# Patient Record
Sex: Male | Born: 1955 | Race: White | Hispanic: No | Marital: Married | Smoking: Never smoker
Health system: Southern US, Community
[De-identification: ages and names within clinical notes are randomized; demographics above are authoritative.]

## PROBLEM LIST (undated history)

## (undated) DIAGNOSIS — H8110 Benign paroxysmal vertigo, unspecified ear: Secondary | ICD-10-CM

## (undated) DIAGNOSIS — M1990 Unspecified osteoarthritis, unspecified site: Secondary | ICD-10-CM

## (undated) DIAGNOSIS — K219 Gastro-esophageal reflux disease without esophagitis: Secondary | ICD-10-CM

## (undated) DIAGNOSIS — N529 Male erectile dysfunction, unspecified: Secondary | ICD-10-CM

## (undated) HISTORY — DX: Male erectile dysfunction, unspecified: N52.9

## (undated) HISTORY — DX: Benign paroxysmal vertigo, unspecified ear: H81.10

## (undated) HISTORY — DX: Gastro-esophageal reflux disease without esophagitis: K21.9

## (undated) HISTORY — DX: Unspecified osteoarthritis, unspecified site: M19.90

---

## 2002-07-09 ENCOUNTER — Emergency Department (HOSPITAL_COMMUNITY): Admission: EM | Admit: 2002-07-09 | Discharge: 2002-07-09 | Payer: Self-pay | Admitting: Emergency Medicine

## 2002-07-14 ENCOUNTER — Emergency Department (HOSPITAL_COMMUNITY): Admission: EM | Admit: 2002-07-14 | Discharge: 2002-07-14 | Payer: Self-pay | Admitting: Emergency Medicine

## 2003-04-01 HISTORY — PX: ROTATOR CUFF REPAIR: SHX139

## 2005-01-29 ENCOUNTER — Emergency Department (HOSPITAL_COMMUNITY): Admission: EM | Admit: 2005-01-29 | Discharge: 2005-01-29 | Payer: Self-pay | Admitting: Emergency Medicine

## 2005-02-01 ENCOUNTER — Ambulatory Visit: Payer: Self-pay | Admitting: Specialist

## 2006-03-31 HISTORY — PX: ANKLE SURGERY: SHX546

## 2010-02-27 ENCOUNTER — Encounter (INDEPENDENT_AMBULATORY_CARE_PROVIDER_SITE_OTHER): Payer: Self-pay | Admitting: *Deleted

## 2010-02-27 ENCOUNTER — Ambulatory Visit: Payer: Self-pay | Admitting: Internal Medicine

## 2010-02-27 DIAGNOSIS — R5381 Other malaise: Secondary | ICD-10-CM

## 2010-02-27 DIAGNOSIS — M159 Polyosteoarthritis, unspecified: Secondary | ICD-10-CM | POA: Insufficient documentation

## 2010-02-27 DIAGNOSIS — R5383 Other fatigue: Secondary | ICD-10-CM

## 2010-02-27 DIAGNOSIS — K219 Gastro-esophageal reflux disease without esophagitis: Secondary | ICD-10-CM

## 2010-03-01 LAB — CONVERTED CEMR LAB
ALT: 38 units/L (ref 0–53)
AST: 27 units/L (ref 0–37)
Albumin: 4.2 g/dL (ref 3.5–5.2)
Alkaline Phosphatase: 58 units/L (ref 39–117)
BUN: 13 mg/dL (ref 6–23)
Basophils Absolute: 0.1 10*3/uL (ref 0.0–0.1)
Basophils Relative: 1.1 % (ref 0.0–3.0)
Bilirubin, Direct: 0.1 mg/dL (ref 0.0–0.3)
CO2: 28 meq/L (ref 19–32)
Calcium: 9.3 mg/dL (ref 8.4–10.5)
Chloride: 103 meq/L (ref 96–112)
Cholesterol: 221 mg/dL — ABNORMAL HIGH (ref 0–200)
Creatinine, Ser: 0.9 mg/dL (ref 0.4–1.5)
Direct LDL: 155.1 mg/dL
Eosinophils Absolute: 0.2 10*3/uL (ref 0.0–0.7)
Eosinophils Relative: 3.2 % (ref 0.0–5.0)
GFR calc non Af Amer: 91.73 mL/min (ref >60)
Glucose, Bld: 88 mg/dL (ref 70–99)
HCT: 41 % (ref 39.0–52.0)
HDL: 39.2 mg/dL (ref >39.00)
Hemoglobin: 14.5 g/dL (ref 13.0–17.0)
Lymphocytes Relative: 22.2 % (ref 12.0–46.0)
Lymphs Abs: 1.2 10*3/uL (ref 0.7–4.0)
MCHC: 35.3 g/dL (ref 30.0–36.0)
MCV: 90.3 fL (ref 78.0–100.0)
Monocytes Absolute: 0.5 10*3/uL (ref 0.1–1.0)
Monocytes Relative: 9.5 % (ref 3.0–12.0)
Neutro Abs: 3.6 10*3/uL (ref 1.4–7.7)
Neutrophils Relative %: 64 % (ref 43.0–77.0)
PSA: 2.33 ng/mL (ref 0.10–4.00)
Phosphorus: 3.7 mg/dL (ref 2.3–4.6)
Platelets: 224 10*3/uL (ref 150.0–400.0)
Potassium: 4.1 meq/L (ref 3.5–5.1)
RBC: 4.54 M/uL (ref 4.22–5.81)
RDW: 13.4 % (ref 11.5–14.6)
Sodium: 140 meq/L (ref 135–145)
TSH: 1.41 microintl units/mL (ref 0.35–5.50)
Total Bilirubin: 0.6 mg/dL (ref 0.3–1.2)
Total CHOL/HDL Ratio: 6
Total Protein: 7.2 g/dL (ref 6.0–8.3)
Triglycerides: 158 mg/dL — ABNORMAL HIGH (ref 0.0–149.0)
VLDL: 31.6 mg/dL (ref 0.0–40.0)
WBC: 5.6 10*3/uL (ref 4.5–10.5)

## 2010-03-29 ENCOUNTER — Encounter (INDEPENDENT_AMBULATORY_CARE_PROVIDER_SITE_OTHER): Payer: Self-pay | Admitting: *Deleted

## 2010-04-03 ENCOUNTER — Ambulatory Visit
Admission: RE | Admit: 2010-04-03 | Discharge: 2010-04-03 | Payer: Self-pay | Source: Home / Self Care | Attending: Internal Medicine | Admitting: Internal Medicine

## 2010-04-17 ENCOUNTER — Ambulatory Visit
Admission: RE | Admit: 2010-04-17 | Discharge: 2010-04-17 | Payer: Self-pay | Source: Home / Self Care | Attending: Internal Medicine | Admitting: Internal Medicine

## 2010-04-17 ENCOUNTER — Encounter: Payer: Self-pay | Admitting: Internal Medicine

## 2010-04-17 LAB — HM COLONOSCOPY

## 2010-04-30 NOTE — Assessment & Plan Note (Signed)
Summary: NEW PT TO BE ESTABLISHED/JRR   Vital Signs:  Patient profile:   55 year old male Height:      67 inches Weight:      212 pounds BMI:     33.32 Temp:     98.6 degrees F oral Pulse rate:   90 / minute Pulse rhythm:   regular BP sitting:   130 / 80  (left arm) Cuff size:   large  Vitals Entered By: Mervin Hack CMA Duncan Dull) (February 27, 2010 11:55 AM) CC: NEW PATIENT TO ESTABLISH CARE   History of Present Illness: Here to establish for medical care Had seen Dr Juanetta Gosling and Chrismon in past  Has seen  ENT for serious dizzy spells.  Never really used the meds for this has had several recurrences  Has had arthrtis since 08-28-2002 Left knee effusion needed drainage No clear diagnosis but not RA Sounds like he did have prednisone wean over a few years and no sig recurrences Now has some wrist pain at times  Uses OTC pill for acid reflux Occ gets feeling of "knot or muscle tightening up" in left chest---then releases  has some fatigue Notes that he will just nap occ --esp after meals not overly tired on a regular basis  Preventive Screening-Counseling & Management  Alcohol-Tobacco     Smoking Status: never  Allergies (verified): 1)  ! Sudafed (Pseudoephedrine Hcl)  Past History:  Past Medical History: Benign positional vertigo Osteoarthritis GERD  Past Surgical History: Rotator cuff right shoulder   2003/08/28 Fracture left ankle (2 pins)  2006/08/28  Family History: Dad died of pancreatic cancer @45  Mom is healthy 1 sister with DM DM in paternal GM also No HTN or CAD No  prostate cancer Pat GM may have had colon cancer in 29's  Social History: Occupation:  IT trainer  at [employer] Married-- 3 children Never Smoked Alcohol use-rare beer/wine Occupation:  employed Smoking Status:  never  Review of Systems General:  Physically active at work Ran until he broke ankle---hasn't gotten back into regular routine weight up a little  again sleeps fairly well--occ pain in right hand affects it wears seat belt. Eyes:  Denies double vision and vision loss-1 eye; reading glasses now. ENT:  Complains of decreased hearing and ringing in ears; tinnitus with the vertigo Has been getting recent work with the dentist. CV:  Complains of shortness of breath with exertion; denies chest pain or discomfort, difficulty breathing at night, difficulty breathing while lying down, fatigue, and palpitations; had palpitations till he went to decaf tea Has noted some changes in being able to run up stairs. Resp:  Denies cough; gets spasm in left chest---??hiatal hernia. GI:  Complains of indigestion; denies abdominal pain, bloody stools, change in bowel habits, dark tarry stools, nausea, and vomiting. GU:  Denies erectile dysfunction, urinary frequency, and urinary hesitancy; only occ nocturia. MS:  Complains of joint pain; denies joint swelling; now just mostly right wrist. Derm:  Denies lesion(s) and rash. Neuro:  Complains of tingling; denies headaches and weakness; occ tingling in hands. Psych:  Denies anxiety and depression. Heme:  Denies abnormal bruising and enlarge lymph nodes. Allergy:  Denies seasonal allergies and sneezing; no recent allergy problems---had fall problems in past.  Physical Exam  General:  alert and normal appearance.   Eyes:  pupils equal, pupils round, pupils reactive to light, and no optic disk abnormalities.   Ears:  R ear normal and L ear normal.   Mouth:  no erythema,  no exudates, and no lesions.   Neck:  supple, no masses, no thyromegaly, no carotid bruits, and no cervical lymphadenopathy.   Lungs:  normal respiratory effort, no intercostal retractions, no accessory muscle use, and normal breath sounds.   Heart:  normal rate, regular rhythm, no murmur, and no gallop.   Abdomen:  soft, non-tender, and no masses.   Rectal:  no hemorrhoids and no masses.   Prostate:  no gland enlargement and no nodules.    Msk:  no joint tenderness and no joint swelling.   Pulses:  1+ in feet Extremities:  no edema Neurologic:  alert & oriented X3, strength normal in all extremities, and gait normal.   Skin:  no rashes and no suspicious lesions.   Axillary Nodes:  No palpable lymphadenopathy Psych:  normally interactive, good eye contact, not anxious appearing, and not depressed appearing.     Impression & Recommendations:  Problem # 1:  PREVENTIVE HEALTH CARE (ICD-V70.0) Assessment Comment Only  healthy discussed fitness due for PSA and colonoscopy  Orders: TLB-Lipid Panel (80061-LIPID)  Problem # 2:  GERD (ICD-530.81) Assessment: Comment Only uses OTC med (?omeprazole) not sure if brief chest symptoms are related  Problem # 3:  OSTEOARTHRITIS (ICD-715.90) Assessment: Comment Only mild in wrist may have had inflammatory arthritis in past but quiet now  Other Orders: TLB-Renal Function Panel (80069-RENAL) TLB-CBC Platelet - w/Differential (85025-CBCD) TLB-Hepatic/Liver Function Pnl (80076-HEPATIC) TLB-TSH (Thyroid Stimulating Hormone) (84443-TSH) Venipuncture ([address]) TLB-PSA (Prostate Specific Antigen) (84153-PSA) Gastroenterology Referral (GI)  Patient Instructions: 1)  Please schedule a follow-up appointment in 1 year.  2)  Schedule a colonoscopy/ sigmoidoscopy to help detect colon cancer.    Orders Added: 1)  New Patient 40-64 years [[address]] 2)  TLB-Renal Function Panel [80069-RENAL] 3)  TLB-CBC Platelet - w/Differential [85025-CBCD] 4)  TLB-Hepatic/Liver Function Pnl [80076-HEPATIC] 5)  TLB-TSH (Thyroid Stimulating Hormone) [84443-TSH] 6)  Venipuncture [60454] 7)  TLB-Lipid Panel [80061-LIPID] 8)  TLB-PSA (Prostate Specific Antigen) [09811-BJY] 9)  Gastroenterology Referral [GI]   Immunization History:  Tetanus/Td Immunization History:    Tetanus/Td:  Historical (03/31/2008)   Immunization History:  Tetanus/Td Immunization History:    Tetanus/Td:  Historical  (03/31/2008)  Prior Medications: None Current Allergies (reviewed today): ! SUDAFED (PSEUDOEPHEDRINE HCL)

## 2010-04-30 NOTE — Letter (Signed)
Summary: Pre Visit Letter Revised  [facility]  [address] [place], [place] [address]   Phone: [phone]  Fax: [phone]        02/27/2010 MRN: [id number] Jose Edwards [address] [place], [place]  [address]             Procedure Date:  04-17-10   Welcome to the Gastroenterology Division at [facility].    You are scheduled to see a nurse for your pre-procedure visit on 04-03-10 at 10:30A.M. on the 3rd floor at [facility], [address]  We ask that you try to arrive at our office 15 minutes prior to your appointment time to allow for check-in.  Please take a minute to review the attached form.  If you answer "Yes" to one or more of the questions on the first page, we ask that you call the person listed at your earliest opportunity.  If you answer "No" to all of the questions, please complete the rest of the form and bring it to your appointment.    Your nurse visit will consist of discussing your medical and surgical history, your immediate family medical history, and your medications.   If you are unable to list all of your medications on the form, please bring the medication bottles to your appointment and we will list them.  We will need to be aware of both prescribed and over the counter drugs.  We will need to know exact dosage information as well.    Please be prepared to read and sign documents such as consent forms, a financial agreement, and acknowledgement forms.  If necessary, and with your consent, a friend or relative is welcome to sit-in on the nurse visit with you.  Please bring your insurance card so that we may make a copy of it.  If your insurance requires a referral to see a specialist, please bring your referral form from your primary care physician.  No co-pay is required for this nurse visit.     If you cannot keep your appointment, please call [phone] to cancel or reschedule prior to your appointment date.  This allows [place]  the opportunity to schedule an appointment for another patient in need of care.    Thank you for choosing [facility] for your medical needs.  We appreciate the opportunity to care for you.  Please visit [place] at our website  to learn more about our practice.  Sincerely, The Gastroenterology Division

## 2010-05-02 NOTE — Procedures (Signed)
Summary: Colonoscopy  Patient: Jose Edwards Note: All result statuses are Final unless otherwise noted.  Tests: (1) Colonoscopy (COL)   COL Colonoscopy           DONE     [facility]     [address]     [place], [place]  [address]           COLONOSCOPY PROCEDURE REPORT           PATIENT:  Jose Edwards, Jose Edwards  MR#:  [id number]     BIRTHDATE:  December 16, 1955, 54 yrs. old  GENDER:  male     ENDOSCOPIST:  Iva Boop, MD, [facility]     REF. BY:  Tillman Abide, M.D.     PROCEDURE DATE:  04/17/2010     PROCEDURE:  Colonoscopy 19147     ASA CLASS:  Class I     INDICATIONS:  Routine Risk Screening     MEDICATIONS:   Fentanyl 50 mcg IV, Versed 5 mg IV           DESCRIPTION OF PROCEDURE:   After the risks benefits and     alternatives of the procedure were thoroughly explained, informed     consent was obtained.  Digital rectal exam was performed and     revealed no abnormalities and normal prostate.   The LB CF-[id number]     [id number] endoscope was introduced through the anus and advanced to     the cecum, which was identified by both the appendix and ileocecal     valve, without limitations.  The quality of the prep was     excellent, using MoviPrep.  The instrument was then slowly     withdrawn as the colon was fully examined. Insertion: 1:18 minutes     Withdrawal: 11:36 minutes     <<PROCEDUREIMAGES>>           FINDINGS:  A normal appearing cecum, ileocecal valve, and     appendiceal orifice were identified. The ascending, hepatic     flexure, transverse, splenic flexure, descending, sigmoid colon,     and rectum appeared unremarkable.   Retroflexed views in the right     colon and  rectum revealed no abnormalities.    The scope was then     withdrawn from the patient and the procedure completed.           COMPLICATIONS:  None     ENDOSCOPIC IMPRESSION:     1) Normal colonoscopy with excellent prep           REPEAT EXAM:  In 10 year(s) for routine screening colonoscopy.          Iva Boop, MD, [facility]           CC:  Tillman Abide, M.D.     The Patient           n.     eSIGNED:   Iva Boop at 04/17/2010 10:32 AM           Mamie Laurel, [phone]  Note: An exclamation mark (!) indicates a result that was not dispersed into the flowsheet. Document Creation Date: 04/17/2010 10:32 AM _______________________________________________________________________  (1) Order result status: Final Collection or observation date-time: 04/17/2010 10:26 Requested date-time:  Receipt date-time:  Reported date-time:  Referring Physician:   Ordering Physician: Stan Head [phone]) Specimen Source:  Source: Launa Grill Order Number: [id number] Lab site:   Appended Document: Colonoscopy    Clinical Lists Changes  Observations: Added new observation of COLONNXTDUE: 03/2020 (04/17/2010 13:24)

## 2010-05-02 NOTE — Miscellaneous (Signed)
Summary: [facility]  Clinical Lists Changes  Medications: Added new medication of MOVIPREP 100 GM  SOLR (PEG-KCL-NACL-NASULF-NA ASC-C) As per prep instructions. - Signed Rx of MOVIPREP 100 GM  SOLR (PEG-KCL-NACL-NASULF-NA ASC-C) As per prep instructions.;  #1 x 0;  Signed;  Entered by: Ezra Sites RN;  Authorized by: Iva Boop MD, [facility];  Method used: Electronically to [facility] [address], [place], [place], [place]  [address], Ph: [phone], Fax: [phone] Observations: Added new observation of ALLERGY REV: Done (04/03/2010 10:15)    Prescriptions: MOVIPREP 100 GM  SOLR (PEG-KCL-NACL-NASULF-NA ASC-C) As per prep instructions.  #1 x 0   Entered by:   Ezra Sites RN   Authorized by:   Iva Boop MD, [facility]   Signed by:   Ezra Sites RN on 04/03/2010   Method used:   Electronically to        [facility] (retail)       [address]       [place], [place]  [address]       Ph: [phone]       Fax: [phone]   RxID:   [id number]

## 2010-05-02 NOTE — Letter (Signed)
Summary: [facility] Instructions  [facility]  [address] [place], [place] [address]   Phone: [phone]  Fax: [phone]       Jose Edwards    07-24-1955    MRN: [id number]        Procedure Day Dorna Bloom: Wednesday 04/17/2010     Arrival Time: 9:00AM     Procedure Time: 10:00AM     Location of Procedure:                    _X_  [facility] (4th Floor)                       PREPARATION FOR COLONOSCOPY WITH MOVIPREP   Starting 5 days prior to your procedure 04/12/2010 do not eat nuts, seeds, popcorn, corn, beans, peas,  salads, or any raw vegetables.  Do not take any fiber supplements (e.g. Metamucil, Citrucel, and Benefiber).  THE DAY BEFORE YOUR PROCEDURE         DATE: 1/17     DAY: Tuesday  1.  Drink clear liquids the entire day-NO SOLID FOOD  2.  Do not drink anything colored red or purple.  Avoid juices with pulp.  No orange juice.  3.  Drink at least 64 oz. (8 glasses) of fluid/clear liquids during the day to prevent dehydration and help the prep work efficiently.  CLEAR LIQUIDS INCLUDE: Water Jello Ice Popsicles Tea (sugar ok, no milk/cream) Powdered fruit flavored drinks Coffee (sugar ok, no milk/cream) Gatorade Juice: apple, white grape, white cranberry  Lemonade Clear bullion, consomm, broth Carbonated beverages (any kind) Strained chicken noodle soup Hard Candy                             4.  In the morning, mix first dose of MoviPrep solution:    Empty 1 Pouch A and 1 Pouch B into the disposable container    Add lukewarm drinking water to the top line of the container. Mix to dissolve    Refrigerate (mixed solution should be used within 24 hrs)  5.  Begin drinking the prep at 5:00 p.m. The MoviPrep container is divided by 4 marks.   Every 15 minutes drink the solution down to the next mark (approximately 8 oz) until the full liter is complete.   6.  Follow completed prep with 16 oz of clear liquid of your choice (Nothing  red or purple).  Continue to drink clear liquids until bedtime.  7.  Before going to bed, mix second dose of MoviPrep solution:    Empty 1 Pouch A and 1 Pouch B into the disposable container    Add lukewarm drinking water to the top line of the container. Mix to dissolve    Refrigerate  THE DAY OF YOUR PROCEDURE      DATE: 1/18     DAY: Wednesday  Beginning at 5:00AM (5 hours before procedure):         1. Every 15 minutes, drink the solution down to the next mark (approx 8 oz) until the full liter is complete.  2. Follow completed prep with 16 oz. of clear liquid of your choice.    3. You may drink clear liquids until 8:00AM (2 HOURS BEFORE PROCEDURE).   MEDICATION INSTRUCTIONS  Unless otherwise instructed, you should take regular prescription medications with a small sip of water   as early as possible the morning  of your procedure.        OTHER INSTRUCTIONS  You will need a responsible adult at least 56 years of age to accompany you and drive you home.   This person must remain in the waiting room during your procedure.  Wear loose fitting clothing that is easily removed.  Leave jewelry and other valuables at home.  However, you may wish to bring a book to read or  an iPod/MP3 player to listen to music as you wait for your procedure to start.  Remove all body piercing jewelry and leave at home.  Total time from sign-in until discharge is approximately 2-3 hours.  You should go home directly after your procedure and rest.  You can resume normal activities the  day after your procedure.  The day of your procedure you should not:   Drive   Make legal decisions   Operate machinery   Drink alcohol   Return to work  You will receive specific instructions about eating, activities and medications before you leave.    The above instructions have been reviewed and explained to me by   Ezra Sites RN  April 03, 2010 10:36 AM    I fully understand and can  verbalize these instructions _____________________________ Date _________

## 2011-10-06 ENCOUNTER — Emergency Department: Payer: Self-pay | Admitting: Unknown Physician Specialty

## 2011-11-17 ENCOUNTER — Ambulatory Visit (INDEPENDENT_AMBULATORY_CARE_PROVIDER_SITE_OTHER): Payer: BC Managed Care – PPO | Admitting: Internal Medicine

## 2011-11-17 ENCOUNTER — Encounter: Payer: Self-pay | Admitting: Internal Medicine

## 2011-11-17 ENCOUNTER — Telehealth: Payer: Self-pay | Admitting: Internal Medicine

## 2011-11-17 VITALS — BP 128/78 | HR 94 | Temp 97.9°F | Ht 67.0 in | Wt 213.0 lb

## 2011-11-17 DIAGNOSIS — R51 Headache: Secondary | ICD-10-CM

## 2011-11-17 NOTE — Progress Notes (Signed)
  Subjective:    Patient ID: Jose Edwards, male    DOB: Jun 08, 1955, 56 y.o.   MRN: [id number]  HPI Here with wife Has spot above right ear--sore to touch since 3-4 days ago Also pain in another spot towards occiput Pain just lying on it in bed--has had to lie on left side No known injury  Now the one in back of head feels worse No inside the head headache No rash or visible lesions  Took some ibuprofen--400mg  this AM may have helped some Pain even with no pressure---but worse with touching  No current outpatient prescriptions on file prior to visit.    Allergies  Allergen Reactions  . Pseudoephedrine     REACTION: makes drowsy    Past Medical History  Diagnosis Date  . GERD (gastroesophageal reflux disease)   . Osteoarthrosis, unspecified whether generalized or localized, unspecified site   . Benign positional vertigo     Past Surgical History  Procedure Date  . Rotator cuff repair 2005    right shoulder  . Ankle surgery 2008    fracture left ankle ( 2 pins)    Family History  Problem Relation Age of Onset  . Healthy Mother   . Diabetes Sister   . Diabetes Paternal Grandmother   . Coronary artery disease Neg Hx   . Hypertension Neg Hx   . Cancer Neg Hx     prostate    History   Social History  . Marital Status: Married    Spouse Name: N/A    Number of Children: 3  . Years of Education: N/A   Occupational History  . [employer]   Social History Main Topics  . Smoking status: Never Smoker   . Smokeless tobacco: Never Used  . Alcohol Use: Yes  . Drug Use: No  . Sexually Active: Not on file   Other Topics Concern  . Not on file   Social History Narrative  . No narrative on file   Review of Systems No diplopia or unilateral vision loss. Slightly blurry vision No jaw claudication    Objective:   Physical Exam  Constitutional: He appears well-developed and well-nourished. No distress.  HENT:  Mouth/Throat: Oropharynx is clear  and moist. No oropharyngeal exudate.       No temporal bruits Mild sensitivity over right occiput No TMJ tenderness Canals and TMs are normal  Neck: Normal range of motion. Neck supple.  Lymphadenopathy:    He has no cervical adenopathy.  Neurological: He is alert. No cranial nerve deficit. Coordination and gait normal.          Assessment & Plan:

## 2011-11-17 NOTE — Telephone Encounter (Signed)
Caller: Lacinda Axon; Patient Name: Jose Edwards; PCP: Tillman Abide; Best Callback Phone Number: [phone].  Called re ongoing R sided head pain; painful to touch behind ear or when lays on that side.  Onset: 11/13/11.  Afebrile.  Intermittent dizzy spells; recent blood from ear canal after using Q-tip.  Called Jose Edwards at work, [phone], for triage.  Advised to see MD within 4 hours for new tenderness over temporal area per Headache Guideline.  Instructed to keep previously scheduled appointment for 11/17/11 at 1600 with Dr Alphonsus Sias.

## 2011-11-17 NOTE — Patient Instructions (Signed)
Please set up a routine physical at your convenience

## 2011-11-17 NOTE — Assessment & Plan Note (Signed)
No obvious diagnosis Most likely thing is muscular---at insertion point into skull Could be shingles and I would treat him with antivirals if rash came out Nothing to suggest temporal arteritis or TMJ  Recommended heat and ibuprofen prn Would consider labs if worsened pain

## 2012-01-13 ENCOUNTER — Ambulatory Visit: Payer: Self-pay | Admitting: General Practice

## 2012-02-12 ENCOUNTER — Other Ambulatory Visit: Payer: Self-pay | Admitting: Specialist

## 2012-02-12 DIAGNOSIS — M25512 Pain in left shoulder: Secondary | ICD-10-CM

## 2012-02-16 ENCOUNTER — Ambulatory Visit
Admission: RE | Admit: 2012-02-16 | Discharge: 2012-02-16 | Disposition: A | Discharge status: Home / Self Care | Payer: BC Managed Care – PPO | Source: Ambulatory Visit | Attending: Specialist | Admitting: Specialist

## 2012-02-16 DIAGNOSIS — M25512 Pain in left shoulder: Secondary | ICD-10-CM

## 2012-02-19 ENCOUNTER — Ambulatory Visit
Admission: RE | Admit: 2012-02-19 | Discharge: 2012-02-19 | Disposition: A | Discharge status: Home / Self Care | Payer: BC Managed Care – PPO | Source: Ambulatory Visit | Attending: Specialist | Admitting: Specialist

## 2012-06-01 ENCOUNTER — Encounter: Payer: BC Managed Care – PPO | Admitting: Internal Medicine

## 2012-06-22 ENCOUNTER — Ambulatory Visit (INDEPENDENT_AMBULATORY_CARE_PROVIDER_SITE_OTHER): Payer: BC Managed Care – PPO | Admitting: Internal Medicine

## 2012-06-22 ENCOUNTER — Encounter: Payer: Self-pay | Admitting: Internal Medicine

## 2012-06-22 VITALS — BP 136/86 | HR 89 | Temp 98.3°F | Ht 67.0 in | Wt 217.0 lb

## 2012-06-22 DIAGNOSIS — M159 Polyosteoarthritis, unspecified: Secondary | ICD-10-CM

## 2012-06-22 DIAGNOSIS — K219 Gastro-esophageal reflux disease without esophagitis: Secondary | ICD-10-CM

## 2012-06-22 DIAGNOSIS — E669 Obesity, unspecified: Secondary | ICD-10-CM | POA: Insufficient documentation

## 2012-06-22 DIAGNOSIS — Z0000 Encounter for general adult medical examination without abnormal findings: Secondary | ICD-10-CM

## 2012-06-22 DIAGNOSIS — N529 Male erectile dysfunction, unspecified: Secondary | ICD-10-CM

## 2012-06-22 MED ORDER — TRAZODONE HCL 50 MG PO TABS: 50.0000 mg | ORAL_TABLET | Freq: Every evening | ORAL | Status: DC | PRN

## 2012-06-22 MED ORDER — TADALAFIL 20 MG PO TABS: 10.0000 mg | ORAL_TABLET | Freq: Every day | ORAL | Status: DC | PRN

## 2012-06-22 NOTE — Assessment & Plan Note (Signed)
Will try cialis 

## 2012-06-22 NOTE — Progress Notes (Signed)
Subjective:    Patient ID: Jose Edwards, male    DOB: [birth year], 57 y.o.   MRN: [id number]  HPI Here for physical Saw Dr Hyacinth Meeker about his left shoulder---MRI report reviewed Got cortisone shot but has swollen again some Not really painful unless heavy lifting or working above his shoulder  Trigger thumbs in past --cortisone helped in the past Left thumb recurred and now similar problems with right pinky Discussed options Hasn't been taking the meloxicam --but it did help  Has DOE now going up steps Can go 3 flights at a time though No recent fitness efforts No chest pain but can get a pressure sensation substernal but it goes away with burping (not exertional) Discussed his obesity  No current outpatient prescriptions on file prior to visit.   No current facility-administered medications on file prior to visit.    Allergies  Allergen Reactions  . Pseudoephedrine     REACTION: makes drowsy    Past Medical History  Diagnosis Date  . GERD (gastroesophageal reflux disease)   . Osteoarthrosis, unspecified whether generalized or localized, unspecified site   . Benign positional vertigo     Past Surgical History  Procedure Laterality Date  . Rotator cuff repair  2005    right shoulder  . Ankle surgery  2008    fracture left ankle ( 2 pins)    Family History  Problem Relation Age of Onset  . Healthy Mother   . Diabetes Sister   . Diabetes Paternal Grandmother   . Coronary artery disease Neg Hx   . Hypertension Neg Hx   . Cancer Neg Hx     prostate    History   Social History  . Marital Status: Married    Spouse Name: N/A    Number of Children: 3  . Years of Education: N/A   Occupational History  . [employer]   Social History Main Topics  . Smoking status: Never Smoker   . Smokeless tobacco: Never Used  . Alcohol Use: Yes  . Drug Use: No  . Sexually Active: Not on file   Other Topics Concern  . Not on file   Social History  Narrative  . No narrative on file   Review of Systems  Constitutional: Positive for unexpected weight change.       Weight up 5# over time Wears seat belt  HENT: Positive for congestion, dental problem and tinnitus. Negative for hearing loss and rhinorrhea.        Regular with dentist--getting some extractions and will need dentures  Eyes: Negative for visual disturbance.       No diplopia or unilateral vision loss  Respiratory: Positive for chest tightness and shortness of breath. Negative for cough.        Relieved by belching  Cardiovascular: Negative for chest pain, palpitations and leg swelling.  Gastrointestinal: Negative for nausea, vomiting, abdominal pain, constipation and blood in stool.       Heartburn controlled with omeprazole  Endocrine: Negative for cold intolerance and heat intolerance.  Genitourinary: Positive for urgency and difficulty urinating. Negative for frequency.       Occ dribbling Now has mild urgency at times---may be related to coffee ED---interested in meds  Musculoskeletal: Positive for arthralgias. Negative for back pain and joint swelling.  Skin: Negative for rash.       No suspicious areas---has mild red area on upper right cheek  Allergic/Immunologic: Positive for environmental allergies. Negative for immunocompromised state.  Hay fever has been better lately--now more in winter and spring New pillow   Neurological: Positive for headaches. Negative for dizziness, syncope, weakness, light-headedness and numbness.       Has some back of head pain he relates to the shoulder pulling Occ tingling in his shoulders--relates to the work with his hands  Hematological: Positive for adenopathy. Does not bruise/bleed easily.       ?neck glands some time back---better now  Psychiatric/Behavioral: Positive for sleep disturbance. Negative for dysphoric mood. The patient is not nervous/anxious.        Not able to sleep---initiation problems. Up multiple  times. No caffeine in afternoon or evening Hasn't tried meds No apparent apnea or gasping. No sig daytime somnolence       Objective:   Physical Exam  Constitutional: He is oriented to person, place, and time. He appears well-developed and well-nourished. No distress.  HENT:  Head: Normocephalic and atraumatic.  Right Ear: External ear normal.  Left Ear: External ear normal.  Mouth/Throat: Oropharynx is clear and moist. No oropharyngeal exudate.  Eyes: Conjunctivae and EOM are normal. Pupils are equal, round, and reactive to light.  Neck: Normal range of motion. Neck supple. No thyromegaly present.  Cardiovascular: Normal rate, regular rhythm, normal heart sounds and intact distal pulses.  Exam reveals no gallop.   No murmur heard. Pulmonary/Chest: Effort normal and breath sounds normal. No respiratory distress. He has no wheezes. He has no rales.  Abdominal: Soft. There is no tenderness.  Musculoskeletal: He exhibits no edema and no tenderness.  Lymphadenopathy:    He has no cervical adenopathy.  Neurological: He is alert and oriented to person, place, and time.  Skin: No rash noted. No erythema.  Psychiatric: He has a normal mood and affect. His behavior is normal.          Assessment & Plan:

## 2012-06-22 NOTE — Assessment & Plan Note (Signed)
Healthy but in poor shape Discussed fitness and eating right Normal PSA, sugar, good chol on work labs----all reviewed

## 2012-06-22 NOTE — Addendum Note (Signed)
Addended by: Sueanne Margarita on: 06/22/2012 03:38 PM   Modules accepted: Orders

## 2012-06-22 NOTE — Assessment & Plan Note (Signed)
Quiet on the PPI 

## 2012-06-22 NOTE — Patient Instructions (Signed)

## 2012-06-22 NOTE — Assessment & Plan Note (Signed)
Discussed fitness 

## 2012-06-22 NOTE — Assessment & Plan Note (Signed)
Okay to use the meloxicam regularly

## 2012-07-02 ENCOUNTER — Encounter: Payer: Self-pay | Admitting: Internal Medicine

## 2013-06-21 LAB — BASIC METABOLIC PANEL: Glucose: 110 mg/dL

## 2013-06-21 LAB — LIPID PANEL
Cholesterol: 205 mg/dL — AB (ref 0–200)
LDL Cholesterol: 141 mg/dL

## 2013-06-24 ENCOUNTER — Encounter: Payer: BC Managed Care – PPO | Admitting: Internal Medicine

## 2013-07-26 ENCOUNTER — Ambulatory Visit (INDEPENDENT_AMBULATORY_CARE_PROVIDER_SITE_OTHER): Payer: BC Managed Care – PPO | Admitting: Internal Medicine

## 2013-07-26 ENCOUNTER — Encounter: Payer: Self-pay | Admitting: Internal Medicine

## 2013-07-26 VITALS — BP 128/78 | HR 98 | Temp 97.8°F | Ht 67.5 in | Wt 219.8 lb

## 2013-07-26 DIAGNOSIS — K219 Gastro-esophageal reflux disease without esophagitis: Secondary | ICD-10-CM

## 2013-07-26 DIAGNOSIS — Z0000 Encounter for general adult medical examination without abnormal findings: Secondary | ICD-10-CM

## 2013-07-26 DIAGNOSIS — N529 Male erectile dysfunction, unspecified: Secondary | ICD-10-CM

## 2013-07-26 MED ORDER — SILDENAFIL CITRATE 20 MG PO TABS: 60.0000 mg | ORAL_TABLET | Freq: Every day | ORAL | Status: DC | PRN

## 2013-07-26 NOTE — Patient Instructions (Signed)
DASH Diet The DASH diet stands for "Dietary Approaches to Stop Hypertension." It is a healthy eating plan that has been shown to reduce high blood pressure (hypertension) in as little as 14 days, while also possibly providing other significant health benefits. These other health benefits include reducing the risk of breast cancer after menopause and reducing the risk of type 2 diabetes, heart disease, colon cancer, and stroke. Health benefits also include weight loss and slowing kidney failure in patients with chronic kidney disease.  DIET GUIDELINES  Limit salt (sodium). Your diet should contain less than 1500 mg of sodium daily.  Limit refined or processed carbohydrates. Your diet should include mostly whole grains. Desserts and added sugars should be used sparingly.  Include small amounts of heart-healthy fats. These types of fats include nuts, oils, and tub margarine. Limit saturated and trans fats. These fats have been shown to be harmful in the body. CHOOSING FOODS  The following food groups are based on a 2000 calorie diet. See your Registered Dietitian for individual calorie needs. Grains and Grain Products (6 to 8 servings daily)  Eat More Often: Whole-wheat bread, brown rice, whole-grain or wheat pasta, quinoa, popcorn without added fat or salt (air popped).  Eat Less Often: White bread, white pasta, white rice, cornbread. Vegetables (4 to 5 servings daily)  Eat More Often: Fresh, frozen, and canned vegetables. Vegetables may be raw, steamed, roasted, or grilled with a minimal amount of fat.  Eat Less Often/Avoid: Creamed or fried vegetables. Vegetables in a cheese sauce. Fruit (4 to 5 servings daily)  Eat More Often: All fresh, canned (in natural juice), or frozen fruits. Dried fruits without added sugar. One hundred percent fruit juice ( cup [237 mL] daily).  Eat Less Often: Dried fruits with added sugar. Canned fruit in light or heavy syrup. Lean Meats, Fish, and Poultry (2  servings or less daily. One serving is 3 to 4 oz [85-114 g]).  Eat More Often: Ninety percent or leaner ground beef, tenderloin, sirloin. Round cuts of beef, chicken breast, turkey breast. All fish. Grill, bake, or broil your meat. Nothing should be fried.  Eat Less Often/Avoid: Fatty cuts of meat, turkey, or chicken leg, thigh, or wing. Fried cuts of meat or fish. Dairy (2 to 3 servings)  Eat More Often: Low-fat or fat-free milk, low-fat plain or light yogurt, reduced-fat or part-skim cheese.  Eat Less Often/Avoid: Milk (whole, 2%).Whole milk yogurt. Full-fat cheeses. Nuts, Seeds, and Legumes (4 to 5 servings per week)  Eat More Often: All without added salt.  Eat Less Often/Avoid: Salted nuts and seeds, canned beans with added salt. Fats and Sweets (limited)  Eat More Often: Vegetable oils, tub margarines without trans fats, sugar-free gelatin. Mayonnaise and salad dressings.  Eat Less Often/Avoid: Coconut oils, palm oils, butter, stick margarine, cream, half and half, cookies, candy, pie. FOR MORE INFORMATION The Dash Diet Eating Plan: www.dashdiet.org Document Released: 03/06/2011 Document Revised: 06/09/2011 Document Reviewed: 03/06/2011 ExitCare Patient Information 2014 [employer]. Exercise to Lose Weight Exercise and a healthy diet may help you lose weight. Your doctor may suggest specific exercises. EXERCISE IDEAS AND TIPS  Choose low-cost things you enjoy doing, such as walking, bicycling, or exercising to workout videos.  Take stairs instead of the elevator.  Walk during your lunch break.  Park your car further away from work or school.  Go to a gym or an exercise class.  Start with 5 to 10 minutes of exercise each day. Build up to 30 minutes   of exercise 4 to 6 days a week.  Wear shoes with good support and comfortable clothes.  Stretch before and after working out.  Work out until you breathe harder and your heart beats faster.  Drink extra water when  you exercise.  Do not do so much that you hurt yourself, feel dizzy, or get very short of breath. Exercises that burn about 150 calories:  Running 1  miles in 15 minutes.  Playing volleyball for 45 to 60 minutes.  Washing and waxing a car for 45 to 60 minutes.  Playing touch football for 45 minutes.  Walking 1  miles in 35 minutes.  Pushing a stroller 1  miles in 30 minutes.  Playing basketball for 30 minutes.  Raking leaves for 30 minutes.  Bicycling 5 miles in 30 minutes.  Walking 2 miles in 30 minutes.  Dancing for 30 minutes.  Shoveling snow for 15 minutes.  Swimming laps for 20 minutes.  Walking up stairs for 15 minutes.  Bicycling 4 miles in 15 minutes.  Gardening for 30 to 45 minutes.  Jumping rope for 15 minutes.  Washing windows or floors for 45 to 60 minutes. Document Released: 04/19/2010 Document Revised: 06/09/2011 Document Reviewed: 04/19/2010 Exit[employer] Patient Information 2014 [employer].  

## 2013-07-26 NOTE — Assessment & Plan Note (Signed)
Will try generic sildenafil 

## 2013-07-26 NOTE — Progress Notes (Signed)
Subjective:    Patient ID: Jose Edwards, male    DOB: 1955-12-27, 58 y.o.   MRN: [id number]  HPI Here for physical [facility] Is on vitamin D for low level Discussed the elevated sugar  No regular exercise since broken ankle Is physically active in shop and yardwork Joined [employer]---but didn't stick with it  Has tender spot along lower sternum Gets sense that it is "blown up" Not bad right now  Discussed the ED meds Will try generic sildenafil  Only uses 25mg  of trazodone This works well If he takes 50mg  ---has wild vivid dreams and AM somnolence  Current Outpatient Prescriptions on File Prior to Visit  Medication Sig Dispense Refill  . omeprazole (PRILOSEC) 20 MG capsule Take 20 mg by mouth daily.      . meloxicam (MOBIC) 15 MG tablet Take 15 mg by mouth daily as needed.        No current facility-administered medications on file prior to visit.    Allergies  Allergen Reactions  . Pseudoephedrine     REACTION: makes drowsy    Past Medical History  Diagnosis Date  . GERD (gastroesophageal reflux disease)   . Osteoarthrosis, unspecified whether generalized or localized, unspecified site   . Benign positional vertigo   . ED (erectile dysfunction)     Past Surgical History  Procedure Laterality Date  . Rotator cuff repair  2005    right shoulder  . Ankle surgery  2008    fracture left ankle ( 2 pins)    Family History  Problem Relation Age of Onset  . Healthy Mother   . Diabetes Sister   . Diabetes Paternal Grandmother   . Coronary artery disease Neg Hx   . Hypertension Neg Hx   . Cancer Neg Hx     prostate    History   Social History  . Marital Status: Married    Spouse Name: N/A    Number of Children: 3  . Years of Education: N/A   Occupational History  . [employer]   Social History Main Topics  . Smoking status: Never Smoker   . Smokeless tobacco: Never Used  . Alcohol Use: Yes  . Drug Use: No    . Sexual Activity: Not on file   Other Topics Concern  . Not on file   Social History Narrative  . No narrative on file   Review of Systems  Constitutional: Negative for fatigue and unexpected weight change.       Wears seat belt  HENT: Positive for dental problem, hearing loss and tinnitus.        Has seen Dr Elenore RotaJuengel for ringing and dizziness (and vertigo). Had Epley maneuver Regular with dentist---recent broken tooth  Eyes: Negative for visual disturbance.       No diplopia or unilateral vision loss  Respiratory: Negative for cough, chest tightness and shortness of breath.   Cardiovascular: Positive for leg swelling. Negative for chest pain and palpitations.       Some left leg swelling from circulation issues  Gastrointestinal: Negative for nausea, vomiting, abdominal pain, constipation and blood in stool.       No heartburn on the PPI  Endocrine: Negative for cold intolerance and heat intolerance.  Genitourinary: Positive for frequency. Negative for urgency and difficulty urinating.       Ongoing ED  Musculoskeletal: Positive for arthralgias. Negative for back pain and joint swelling.  Skin: Negative for rash.  No suspicious lesions  Allergic/Immunologic: Positive for environmental allergies. Negative for immunocompromised state.       Now has spring allergies-- using OTC cetirizine  Neurological: Positive for dizziness and numbness. Negative for syncope, weakness, light-headedness and headaches.       Some numbness in left lateral thigh  Hematological: Negative for adenopathy. Bruises/bleeds easily.  Psychiatric/Behavioral: Positive for sleep disturbance. Negative for dysphoric mood. The patient is not nervous/anxious.        Only uses the trazodone 2-3 times per week       Objective:   Physical Exam  Constitutional: He is oriented to person, place, and time. He appears well-developed and well-nourished. No distress.  HENT:  Head: Normocephalic and atraumatic.   Right Ear: External ear normal.  Left Ear: External ear normal.  Mouth/Throat: Oropharynx is clear and moist. No oropharyngeal exudate.  Eyes: Conjunctivae and EOM are normal. Pupils are equal, round, and reactive to light.  Neck: Normal range of motion. Neck supple. No thyromegaly present.  Cardiovascular: Normal rate, regular rhythm, normal heart sounds and intact distal pulses.  Exam reveals no gallop.   No murmur heard. Pulmonary/Chest: Effort normal and breath sounds normal. No respiratory distress. He has no wheezes. He has no rales.  Abdominal: Soft. There is no tenderness.  Musculoskeletal: He exhibits no edema and no tenderness.  Lymphadenopathy:    He has no cervical adenopathy.  Neurological: He is alert and oriented to person, place, and time.  Skin: No rash noted. No erythema.  Psychiatric: He has a normal mood and affect. His behavior is normal.          Assessment & Plan:

## 2013-07-26 NOTE — Assessment & Plan Note (Signed)
Controlled Slight lower sternum tenderness---not at stomach

## 2013-07-26 NOTE — Progress Notes (Signed)
Pre visit review using our clinic review tool, if applicable. No additional management support is needed unless otherwise documented below in the visit note. 

## 2013-07-26 NOTE — Assessment & Plan Note (Signed)
Healthy but out of shape Info given Has elevated glucose (110)---counseled about diabetes risk

## 2013-08-15 ENCOUNTER — Encounter: Payer: Self-pay | Admitting: Internal Medicine

## 2013-10-24 ENCOUNTER — Ambulatory Visit: Payer: Self-pay | Admitting: Medical

## 2013-10-28 ENCOUNTER — Emergency Department: Payer: Self-pay | Admitting: Emergency Medicine

## 2013-12-12 ENCOUNTER — Other Ambulatory Visit: Payer: Self-pay | Admitting: Specialist

## 2013-12-12 DIAGNOSIS — M25562 Pain in left knee: Secondary | ICD-10-CM

## 2013-12-13 ENCOUNTER — Other Ambulatory Visit: Payer: Self-pay | Admitting: Specialist

## 2013-12-13 DIAGNOSIS — M25562 Pain in left knee: Secondary | ICD-10-CM

## 2013-12-17 ENCOUNTER — Other Ambulatory Visit: Payer: BC Managed Care – PPO

## 2013-12-24 ENCOUNTER — Ambulatory Visit
Admission: RE | Admit: 2013-12-24 | Discharge: 2013-12-24 | Disposition: A | Discharge status: Home / Self Care | Payer: BC Managed Care – PPO | Source: Ambulatory Visit | Attending: Specialist | Admitting: Specialist

## 2013-12-24 DIAGNOSIS — M25562 Pain in left knee: Secondary | ICD-10-CM

## 2014-03-01 ENCOUNTER — Ambulatory Visit: Payer: Self-pay | Admitting: Specialist

## 2014-03-16 ENCOUNTER — Encounter: Payer: Self-pay | Admitting: Specialist

## 2014-03-31 ENCOUNTER — Encounter: Payer: Self-pay | Admitting: Specialist

## 2014-07-22 NOTE — Op Note (Signed)
PATIENT NAME:  [name], Jose Edwards DATE OF BIRTH:  1955/04/23  DATE OF PROCEDURE:  03/01/2014  PREOPERATIVE DIAGNOSES:  1.  Tear of the posterior root of the left medial meniscus. 2.  Large medial plica, left knee.  3.  Significant synovitis, left knee.  POSTOPERATIVE DIAGNOSES: 1.  Tear of the posterior root of the left medial meniscus. 2.  Large medial plica, left knee.  3.  Significant synovitis, left knee.   PROCEDURES:  1.  Arthroscopic partial left medial meniscectomy posteriorly.  2.  Arthroscopic plica excision.  3.  Arthroscopic partial synovectomy.   SURGEON: Valinda HoarHoward E. Desarai Barrack, MD   ANESTHESIA: General LMA.   COMPLICATIONS: None.   DRAINS: None.   OPERATIVE FINDINGS: The patient had an unstable displaced tear of the posterior root of the medial meniscus. The medial femoral condyle was pristine with some grade 2 softening of the tibial cartilage. The lateral compartment was normal and quite tight. The ACL and PCL were normal. There was a lot of anterior and suprapatellar synovitis. There was a large medial plica. There were no loose bodies. The patellofemoral region showed minimal softening of the cartilage.   OPERATIVE PROCEDURE: The patient was brought to the operating room where he underwent satisfactory general LMA anesthesia in the supine position. The arthroscopy was carried out through standard portals. The above findings were encountered on arthroscopy. The medial compartment was opened and the posterior root of the medial meniscus was debrided using basket forceps, motorized resector and ArthroCare wand. We did not have to remove a large portion of the meniscus. The remainder of the meniscus was probed and was intact. The synovitis was debrided fairly extensively for visualization. The medial plica was excised using the motorized resector. The ArthroCare wand was used to cauterize all small bleeders. The remainder of the knee was examined and showed no other  significant abnormalities. The pump pressure was decreased to 20 mm and all bleeders were cauterized. Pressure was then increased and the knee was thoroughly flushed with fluid. Instruments removed and stab wounds were closed with 3-0 nylon suture. Marcaine 0.5 with epinephrine and morphine was placed in the joint. Dry sterile compression dressing was applied. The tourniquet was not used. The patient was awakened and taken to recovery in good condition.    ____________________________ Valinda HoarHoward E. Chlora Mcbain, MD hem:TT D: 03/01/2014 09:16:44 ET T: 03/01/2014 16:22:57 ET JOB#: [id number]  cc: Valinda HoarHoward E. Esco Joslyn, MD, <Dictator> Valinda HoarHOWARD E Amariyana Heacox MD ELECTRONICALLY SIGNED 03/02/2014 11:13

## 2014-07-31 ENCOUNTER — Encounter: Payer: BC Managed Care – PPO | Admitting: Internal Medicine

## 2015-07-05 ENCOUNTER — Other Ambulatory Visit: Payer: Self-pay | Admitting: Specialist

## 2015-07-05 DIAGNOSIS — M25561 Pain in right knee: Secondary | ICD-10-CM

## 2015-07-09 ENCOUNTER — Ambulatory Visit
Admission: RE | Admit: 2015-07-09 | Discharge: 2015-07-09 | Disposition: A | Discharge status: Home / Self Care | Payer: Worker's Compensation | Source: Ambulatory Visit | Attending: Specialist | Admitting: Specialist

## 2015-07-09 DIAGNOSIS — M25561 Pain in right knee: Secondary | ICD-10-CM

## 2015-09-17 ENCOUNTER — Ambulatory Visit (INDEPENDENT_AMBULATORY_CARE_PROVIDER_SITE_OTHER): Payer: BLUE CROSS/BLUE SHIELD | Admitting: Internal Medicine

## 2015-09-17 ENCOUNTER — Encounter: Payer: Self-pay | Admitting: Internal Medicine

## 2015-09-17 VITALS — BP 112/80 | HR 101 | Temp 97.7°F | Wt 214.0 lb

## 2015-09-17 DIAGNOSIS — M7022 Olecranon bursitis, left elbow: Secondary | ICD-10-CM | POA: Diagnosis not present

## 2015-09-17 DIAGNOSIS — M71122 Other infective bursitis, left elbow: Secondary | ICD-10-CM

## 2015-09-17 DIAGNOSIS — B9689 Other specified bacterial agents as the cause of diseases classified elsewhere: Secondary | ICD-10-CM | POA: Diagnosis not present

## 2015-09-17 MED ORDER — CEPHALEXIN 500 MG PO CAPS: 500.0000 mg | ORAL_CAPSULE | Freq: Three times a day (TID) | ORAL | Status: DC

## 2015-09-17 NOTE — Patient Instructions (Signed)
Please try heat on your elbow. Take 2 doses of the antibiotic tonight--and take the meloxicam daily till this is better. Call if this is not better by Wednesday

## 2015-09-17 NOTE — Assessment & Plan Note (Signed)
No history of gout and this is probably infectious with the open spot Increase meloxicam to daily Try heat instead of ice Cephalexin---call if not responding

## 2015-09-17 NOTE — Progress Notes (Signed)
Pre visit review using our clinic review tool, if applicable. No additional management support is needed unless otherwise documented below in the visit note. 

## 2015-09-17 NOTE — Progress Notes (Signed)
   Subjective:    Patient ID: Jose Edwards, male    DOB: 03-29-1956, 60 y.o.   MRN: [id number]  HPI Here due to left elbow swelling  Started at the point of the elbow--- just a sore spot.  Started 3 days ago Then more puffiness and increased swelling By yesterday--much bigger Now has moved distally  Doesn't remember any trauma Was at beach this weekend--no known bites either  No Rx except advil--- 400mg  three separate times Did try some ice last night  Current Outpatient Prescriptions on File Prior to Visit  Medication Sig Dispense Refill  . cholecalciferol (VITAMIN D) 1000 UNITS tablet Take 1,000 Units by mouth daily.    . meloxicam (MOBIC) 15 MG tablet Take 15 mg by mouth daily as needed.     Marland Kitchen. omeprazole (PRILOSEC) 20 MG capsule Take 20 mg by mouth daily.    . sildenafil (REVATIO) 20 MG tablet Take 3-5 tablets (60-100 mg total) by mouth daily as needed. (Patient not taking: Reported on 09/17/2015) 50 tablet 11  . traZODone (DESYREL) 50 MG tablet Take 25-50 mg by mouth at bedtime as needed for sleep. Reported on 09/17/2015     No current facility-administered medications on file prior to visit.    Allergies  Allergen Reactions  . Pseudoephedrine     REACTION: makes drowsy    Past Medical History  Diagnosis Date  . GERD (gastroesophageal reflux disease)   . Osteoarthrosis, unspecified whether generalized or localized, unspecified site   . Benign positional vertigo   . ED (erectile dysfunction)     Past Surgical History  Procedure Laterality Date  . Rotator cuff repair  2005    right shoulder  . Ankle surgery  2008    fracture left ankle ( 2 pins)    Family History  Problem Relation Age of Onset  . Healthy Mother   . Diabetes Sister   . Diabetes Paternal Grandmother   . Coronary artery disease Neg Hx   . Hypertension Neg Hx   . Cancer Neg Hx     prostate    Social History   Social History  . Marital Status: Married    Spouse Name: N/A  . Number  of Children: 3  . Years of Education: N/A   Occupational History  . [employer]   Social History Main Topics  . Smoking status: Never Smoker   . Smokeless tobacco: Never Used  . Alcohol Use: Yes  . Drug Use: No  . Sexual Activity: Not on file   Other Topics Concern  . Not on file   Social History Narrative   Review of Systems No history of gout No fever--but felt bad yesterday around 2PM---brief chill. Then better No other joint swelling    Objective:   Physical Exam  Musculoskeletal:  Swelling, warmth and redness of left olecranon bursa. Elbow itself is quiet Partially scabbed lesion at the proximal side of this Swelling without redness or warmth along extensor forearm          Assessment & Plan:

## 2015-09-19 ENCOUNTER — Telehealth: Payer: Self-pay

## 2015-09-19 NOTE — Telephone Encounter (Signed)
Pt does not have appt scheduled;[facility]

## 2015-09-19 NOTE — Telephone Encounter (Signed)
Please check on him in the morning and make sure he sees clear improvement

## 2015-09-19 NOTE — Telephone Encounter (Signed)
Please add him in at the end of my morning

## 2015-09-19 NOTE — Telephone Encounter (Signed)
PLEASE NOTE: All timestamps contained within this report are represented as [place] Standard Time. CONFIDENTIALTY NOTICE: This fax transmission is intended only for the addressee. It contains information that is legally privileged, confidential or otherwise protected from use or disclosure. If you are not the intended recipient, you are strictly prohibited from reviewing, disclosing, copying using or disseminating any of this information or taking any action in reliance on or regarding this information. If you have received this fax in error, please notify us immediately by telephone so that we can arrange for its return to us. Phone: [phone], Toll-Free: [phone], Fax: [phone] Page: 1 of 2 Call Id: [id number] [facility] - Client TELEPHONE ADVICE RECORD [facility] Patient Name: Jose Edwards Gender: Male DOB: [birth year] Age: [age] Y 2 M 1 D Return Phone Number: [phone] (Primary), [phone] (Secondary) Address: City/State/Zip:  Client [facility] - Night Physician Tillman AbideLetvak, Richard - MD Contact Type Call Who Is Calling Patient / Member / Family / Caregiver Call Type Triage / Clinical Caller Name Jose Edwards Relationship To Patient Spouse Return Phone Number [phone] (Primary) Chief Complaint Skin Lesion - Moles/ Lumps/ Growths Reason for Call Symptomatic / Request for Health Information Initial Comment Caller states her husband saw the MD yesterday for a possible bite on his arm/elbow. The PT was given Antibiotics, has had four doses at present. The spot is bigger, has moved down his arm, and has heat in it. PreDisposition Did not know what to do Translation No Nurse Assessment Nurse: Loma MessingInsell, RN, Burna MortimerWanda Date/Time (Eastern Time): 09/18/2015 5:14:34 PM Confirm and document reason for call. If symptomatic, describe symptoms. You  must click the next button to save text entered. ---Caller states infection on arm , had possible bite , it is redder and larger, and area is warm.Caller can see fluid in it. Has the patient traveled out of the country within the last 30 days? ---No Does the patient have any new or worsening symptoms? ---Yes Will a triage be completed? ---Yes Related visit to physician within the last 2 weeks? ---Yes Does the PT have any chronic conditions? (i.e. diabetes, asthma, etc.) ---No Is this a behavioral health or substance abuse call? ---No Guidelines Guideline Title Affirmed Question Affirmed Notes Nurse Date/Time (Eastern Time) Skin Lump or Localized Swelling [1] Swelling is painful to touch AND [2] no fever Insell, RN, Burna MortimerWanda 09/18/2015 5:20:53 PM Disp. Time Lamount Cohen(Eastern Time) Disposition Final User 09/18/2015 5:24:42 PM See Physician within 24 Hours Yes Insell, RN, Burna MortimerWanda PLEASE NOTE: All timestamps contained within this report are represented as [place] Standard Time. CONFIDENTIALTY NOTICE: This fax transmission is intended only for the addressee. It contains information that is legally privileged, confidential or otherwise protected from use or disclosure. If you are not the intended recipient, you are strictly prohibited from reviewing, disclosing, copying using or disseminating any of this information or taking any action in reliance on or regarding this information. If you have received this fax in error, please notify us immediately by telephone so that we can arrange for its return to us. Phone: [phone], Toll-Free: [phone], Fax: [phone] Page: 2 of 2 Call Id: [id number] Caller Understands: Yes Disagree/Comply: Comply Care Advice Given Per Guideline SEE PHYSICIAN WITHIN 24 HOURS: * IF OFFICE WILL BE OPEN: You need to be seen within the next 24 hours. Call your doctor when the office opens, and make an appointment. IBUPROFEN (E.G., MOTRIN, ADVIL): *  Take 400 mg (two 200 mg  pills) by mouth every 6 hours as needed. * Another choice is to take 600 mg (three 200 mg pills) by mouth every 8 hours as needed. CALL BACK IF: * Severe pain occurs * Fever occurs * You become worse. CARE ADVICE given per Skin Swelling or Lump (Adult) guideline. Referrals REFERRED TO PCP OFFICE

## 2015-09-19 NOTE — Telephone Encounter (Signed)
Jose Edwards contacted pt and pt cannot come in for appt today; I spoke with pt after taking abx today pt has less swelling in elbow and forearm; less redness and not as warm to touch; no red streaks. Pt will continue taking abx and will cb early AM on 09/20/15 if improvement does not continue and schedule appt on 09/20/15.

## 2015-09-20 ENCOUNTER — Encounter: Payer: Self-pay | Admitting: Internal Medicine

## 2015-09-20 ENCOUNTER — Ambulatory Visit (INDEPENDENT_AMBULATORY_CARE_PROVIDER_SITE_OTHER): Payer: BLUE CROSS/BLUE SHIELD | Admitting: Internal Medicine

## 2015-09-20 VITALS — BP 138/80 | HR 82 | Temp 97.8°F | Wt 214.0 lb

## 2015-09-20 DIAGNOSIS — M7022 Olecranon bursitis, left elbow: Secondary | ICD-10-CM | POA: Diagnosis not present

## 2015-09-20 DIAGNOSIS — B9689 Other specified bacterial agents as the cause of diseases classified elsewhere: Secondary | ICD-10-CM | POA: Diagnosis not present

## 2015-09-20 DIAGNOSIS — M71122 Other infective bursitis, left elbow: Secondary | ICD-10-CM

## 2015-09-20 NOTE — Progress Notes (Signed)
Pre visit review using our clinic review tool, if applicable. No additional management support is needed unless otherwise documented below in the visit note. 

## 2015-09-20 NOTE — Assessment & Plan Note (Signed)
Clearly better from an infection standpoint--will have him finish out the antibiotics Continues on meloxicam Fluid in arm may take weeks to resorb but it will--reassured

## 2015-09-20 NOTE — Telephone Encounter (Signed)
Pt is scheduled to see us at 2pm today

## 2015-09-20 NOTE — Progress Notes (Signed)
   Subjective:    Patient ID: Jose Edwards, male    DOB: 03-14-1956, 60 y.o.   MRN: [id number]  HPI Here due to ongoing elbow problems  Now more swollen into forearm The elbow itself is still red and swollen---though down a little  Continuing on the cephalexin Has used some heat on it-no clear effect  Current Outpatient Prescriptions on File Prior to Visit  Medication Sig Dispense Refill  . cephALEXin (KEFLEX) 500 MG capsule Take 1 capsule (500 mg total) by mouth 3 (three) times daily. 30 capsule 0  . cholecalciferol (VITAMIN D) 1000 UNITS tablet Take 1,000 Units by mouth daily.    . meloxicam (MOBIC) 15 MG tablet Take 15 mg by mouth daily as needed.     Marland Kitchen. omeprazole (PRILOSEC) 20 MG capsule Take 20 mg by mouth daily.    . sildenafil (REVATIO) 20 MG tablet Take 3-5 tablets (60-100 mg total) by mouth daily as needed. 50 tablet 11  . traZODone (DESYREL) 50 MG tablet Take 25-50 mg by mouth at bedtime as needed for sleep. Reported on 09/17/2015     No current facility-administered medications on file prior to visit.    Allergies  Allergen Reactions  . Pseudoephedrine     REACTION: makes drowsy    Past Medical History  Diagnosis Date  . GERD (gastroesophageal reflux disease)   . Osteoarthrosis, unspecified whether generalized or localized, unspecified site   . Benign positional vertigo   . ED (erectile dysfunction)     Past Surgical History  Procedure Laterality Date  . Rotator cuff repair  2005    right shoulder  . Ankle surgery  2008    fracture left ankle ( 2 pins)    Family History  Problem Relation Age of Onset  . Healthy Mother   . Diabetes Sister   . Diabetes Paternal Grandmother   . Coronary artery disease Neg Hx   . Hypertension Neg Hx   . Cancer Neg Hx     prostate    Social History   Social History  . Marital Status: Married    Spouse Name: N/A  . Number of Children: 3  . Years of Education: N/A   Occupational History  . [employer]   Social History Main Topics  . Smoking status: Never Smoker   . Smokeless tobacco: Never Used  . Alcohol Use: Yes  . Drug Use: No  . Sexual Activity: Not on file   Other Topics Concern  . Not on file   Social History Narrative   Review of Systems  No fever Doesn't feel sick Takes the meloxicam daily     Objective:   Physical Exam  Musculoskeletal:  Ongoing swelling of left olecranon bursa but down Has some diffuse swelling into forearm--no pocket of fluid anywhere No clear redness, warmth or tenderness          Assessment & Plan:

## 2016-03-28 ENCOUNTER — Encounter: Payer: BLUE CROSS/BLUE SHIELD | Admitting: Internal Medicine

## 2016-05-21 ENCOUNTER — Ambulatory Visit (INDEPENDENT_AMBULATORY_CARE_PROVIDER_SITE_OTHER): Payer: BLUE CROSS/BLUE SHIELD | Admitting: Internal Medicine

## 2016-05-21 ENCOUNTER — Encounter: Payer: Self-pay | Admitting: Internal Medicine

## 2016-05-21 VITALS — BP 122/88 | HR 93 | Temp 98.0°F | Ht 68.0 in | Wt 224.0 lb

## 2016-05-21 DIAGNOSIS — Z0000 Encounter for general adult medical examination without abnormal findings: Secondary | ICD-10-CM | POA: Diagnosis not present

## 2016-05-21 DIAGNOSIS — Z125 Encounter for screening for malignant neoplasm of prostate: Secondary | ICD-10-CM | POA: Diagnosis not present

## 2016-05-21 DIAGNOSIS — G479 Sleep disorder, unspecified: Secondary | ICD-10-CM | POA: Diagnosis not present

## 2016-05-21 DIAGNOSIS — K219 Gastro-esophageal reflux disease without esophagitis: Secondary | ICD-10-CM

## 2016-05-21 LAB — CBC WITH DIFFERENTIAL/PLATELET
BASOS PCT: 1.2 % (ref 0.0–3.0)
Basophils Absolute: 0.1 10*3/uL (ref 0.0–0.1)
EOS ABS: 0.1 10*3/uL (ref 0.0–0.7)
Eosinophils Relative: 2.2 % (ref 0.0–5.0)
HCT: 40.7 % (ref 39.0–52.0)
Hemoglobin: 13.8 g/dL (ref 13.0–17.0)
LYMPHS PCT: 21.6 % (ref 12.0–46.0)
Lymphs Abs: 1.1 10*3/uL (ref 0.7–4.0)
MCHC: 34 g/dL (ref 30.0–36.0)
MCV: 89 fl (ref 78.0–100.0)
Monocytes Absolute: 0.4 10*3/uL (ref 0.1–1.0)
Monocytes Relative: 6.8 % (ref 3.0–12.0)
Neutro Abs: 3.6 10*3/uL (ref 1.4–7.7)
Neutrophils Relative %: 68.2 % (ref 43.0–77.0)
Platelets: 230 10*3/uL (ref 150.0–400.0)
RBC: 4.57 Mil/uL (ref 4.22–5.81)
RDW: 13.5 % (ref 11.5–15.5)
WBC: 5.3 10*3/uL (ref 4.0–10.5)

## 2016-05-21 LAB — LIPID PANEL
Cholesterol: 200 mg/dL (ref 0–200)
HDL: 39.4 mg/dL (ref >39.00)
LDL Cholesterol: 143 mg/dL — ABNORMAL HIGH (ref 0–99)
NonHDL: 160.72
Total CHOL/HDL Ratio: 5
Triglycerides: 88 mg/dL (ref 0.0–149.0)
VLDL: 17.6 mg/dL (ref 0.0–40.0)

## 2016-05-21 LAB — COMPREHENSIVE METABOLIC PANEL
ALBUMIN: 4.3 g/dL (ref 3.5–5.2)
ALK PHOS: 57 U/L (ref 39–117)
ALT: 20 U/L (ref 0–53)
AST: 19 U/L (ref 0–37)
BUN: 13 mg/dL (ref 6–23)
CALCIUM: 9.2 mg/dL (ref 8.4–10.5)
CHLORIDE: 104 meq/L (ref 96–112)
CO2: 30 mEq/L (ref 19–32)
Creatinine, Ser: 0.93 mg/dL (ref 0.40–1.50)
GFR: 87.55 mL/min (ref >60.00)
Glucose, Bld: 92 mg/dL (ref 70–99)
Potassium: 4.3 mEq/L (ref 3.5–5.1)
Sodium: 138 mEq/L (ref 135–145)
Total Bilirubin: 0.7 mg/dL (ref 0.2–1.2)
Total Protein: 6.9 g/dL (ref 6.0–8.3)

## 2016-05-21 LAB — PSA: PSA: 2.79 ng/mL (ref 0.10–4.00)

## 2016-05-21 MED ORDER — TRAZODONE HCL 50 MG PO TABS: 25.0000 mg | ORAL_TABLET | Freq: Every evening | ORAL | 0 refills | Status: DC | PRN

## 2016-05-21 NOTE — Patient Instructions (Addendum)
Please get your flu shot at [employer] this fall. Please increase the trazodone--like to every other night--to see if that gets rid of the morning spells.  DASH Eating Plan DASH stands for "Dietary Approaches to Stop Hypertension." The DASH eating plan is a healthy eating plan that has been shown to reduce high blood pressure (hypertension). Additional health benefits may include reducing the risk of type 2 diabetes mellitus, heart disease, and stroke. The DASH eating plan may also help with weight loss. What do I need to know about the DASH eating plan? For the DASH eating plan, you will follow these general guidelines:  Choose foods with less than 150 milligrams of sodium per serving (as listed on the food label).  Use salt-free seasonings or herbs instead of table salt or sea salt.  Check with your health care provider or pharmacist before using salt substitutes.  Eat lower-sodium products. These are often labeled as "low-sodium" or "no salt added."  Eat fresh foods. Avoid eating a lot of canned foods.  Eat more vegetables, fruits, and low-fat dairy products.  Choose whole grains. Look for the word "whole" as the first word in the ingredient list.  Choose fish and skinless chicken or Mal[place]key more often than red meat. Limit fish, poultry, and meat to 6 oz (170 g) each day.  Limit sweets, desserts, sugars, and sugary drinks.  Choose heart-healthy fats.  Eat more home-cooked food and less restaurant, buffet, and fast food.  Limit fried foods.  Do not fry foods. Cook foods using methods such as baking, boiling, grilling, and broiling instead.  When eating at a restaurant, ask that your food be prepared with less salt, or no salt if possible. What foods can I eat? Seek help from a dietitian for individual calorie needs. Grains  Whole grain or whole wheat bread. Brown rice. Whole grain or whole wheat pasta. Quinoa, bulgur, and whole grain cereals. Low-sodium cereals. Corn or whole wheat  flour tortillas. Whole grain cornbread. Whole grain crackers. Low-sodium crackers. Vegetables  Fresh or frozen vegetables (raw, steamed, roasted, or grilled). Low-sodium or reduced-sodium tomato and vegetable juices. Low-sodium or reduced-sodium tomato sauce and paste. Low-sodium or reduced-sodium canned vegetables. Fruits  All fresh, canned (in natural juice), or frozen fruits. Meat and Other Protein Products  Ground beef (85% or leaner), grass-fed beef, or beef trimmed of fat. Skinless chicken or [place]key. Ground chicken or [place]key. Pork trimmed of fat. All fish and seafood. Eggs. Dried beans, peas, or lentils. Unsalted nuts and seeds. Unsalted canned beans. Dairy  Low-fat dairy products, such as skim or 1% milk, 2% or reduced-fat cheeses, low-fat ricotta or cottage cheese, or plain low-fat yogurt. Low-sodium or reduced-sodium cheeses. Fats and Oils  Tub margarines without trans fats. Light or reduced-fat mayonnaise and salad dressings (reduced sodium). Avocado. Safflower, olive, or canola oils. Natural peanut or almond butter. Other  Unsalted popcorn and pretzels. The items listed above may not be a complete list of recommended foods or beverages. Contact your dietitian for more options.  What foods are not recommended? Grains  White bread. White pasta. White rice. Refined cornbread. Bagels and croissants. Crackers that contain trans fat. Vegetables  Creamed or fried vegetables. Vegetables in a cheese sauce. Regular canned vegetables. Regular canned tomato sauce and paste. Regular tomato and vegetable juices. Fruits  Canned fruit in light or heavy syrup. Fruit juice. Meat and Other Protein Products  Fatty cuts of meat. Ribs, chicken wings, bacon, sausage, bologna, salami, chitterlings, fatback, hot dogs, bratwurst, and packaged  luncheon meats. Salted nuts and seeds. Canned beans with salt. Dairy  Whole or 2% milk, cream, half-and-half, and cream cheese. Whole-fat or sweetened yogurt. Full-fat  cheeses or blue cheese. Nondairy creamers and whipped toppings. Processed cheese, cheese spreads, or cheese curds. Condiments  Onion and garlic salt, seasoned salt, table salt, and sea salt. Canned and packaged gravies. Worcestershire sauce. Tartar sauce. Barbecue sauce. Teriyaki sauce. Soy sauce, including reduced sodium. Steak sauce. Fish sauce. Oyster sauce. Cocktail sauce. Horseradish. Ketchup and mustard. Meat flavorings and tenderizers. Bouillon cubes. Hot sauce. Tabasco sauce. Marinades. Taco seasonings. Relishes. Fats and Oils  Butter, stick margarine, lard, shortening, ghee, and bacon fat. Coconut, palm kernel, or palm oils. Regular salad dressings. Other  Pickles and olives. Salted popcorn and pretzels. The items listed above may not be a complete list of foods and beverages to avoid. Contact your dietitian for more information.  Where can I find more information? National Heart, Lung, and Blood Institute: travelstabloid.com This information is not intended to replace advice given to you by your health care provider. Make sure you discuss any questions you have with your health care provider. Document Released: 03/06/2011 Document Revised: 08/23/2015 Document Reviewed: 01/19/2013 Elsevier Interactive Patient Education  2017 [employer].

## 2016-05-21 NOTE — Progress Notes (Signed)
   Subjective:    Patient ID: Jose Edwards, male    DOB: February 04, 1956, 61 y.o.   MRN: [id number]  HPI Here for physical  He feels he is doing fairly well Does have a spot in center of breastbone--- will puff up and feel like a knot Then it will go away Does take prilosec every day No dysphagia Not sure it is related to eating---definitely not exertional Points near xiphoid--- ?bloating  Awakens some mornings feeling jittery He will feel like he needs to jump out of bed Will feel nervous for a while---calmed after eating (but doesn't go away) BP was okay when wife checked Sugars also okay (brother and dad are diabetic so she has machine) Still takes the trazodone--but not regularly. Doesn't have the symptom after taking it Usually sleeps okay--so only using it 1-2 nights per week  Ongoing knee problems---workman's comp Takes the meloxicam prn   Review of Systems  Constitutional: Positive for unexpected weight change. Negative for fatigue.       Didn't realize his weight is up No real exercise--relates to his knees Wears seat belt  HENT: Positive for tinnitus. Negative for trouble swallowing.        Some hearing loss--does use hearing protection Needs 1 tooth pulled--regular with dentist  Eyes: Negative for visual disturbance.       No diplopia or unilateral vision loss  Respiratory: Negative for cough, chest tightness and shortness of breath.   Cardiovascular: Negative for chest pain, palpitations and leg swelling.  Gastrointestinal: Negative for abdominal pain, blood in stool, constipation, nausea and vomiting.  Endocrine: Negative for polydipsia and polyuria.  Genitourinary: Negative for difficulty urinating, frequency and urgency.       Some slight success with sildenafil--but only tried 2-3 at a time (did have some flushing)  Musculoskeletal: Positive for arthralgias. Negative for back pain and joint swelling.  Skin: Negative for rash.       No suspicious lesions   Allergic/Immunologic: Negative for environmental allergies and immunocompromised state.  Neurological: Positive for headaches. Negative for dizziness, syncope and light-headedness.  Hematological: Negative for adenopathy. Bruises/bleeds easily.  Psychiatric/Behavioral: Positive for sleep disturbance. Negative for dysphoric mood. The patient is not nervous/anxious.        Objective:   Physical Exam  Constitutional: He is oriented to person, place, and time. He appears well-developed and well-nourished. No distress.  HENT:  Head: Normocephalic and atraumatic.  Right Ear: External ear normal.  Left Ear: External ear normal.  Mouth/Throat: Oropharynx is clear and moist. No oropharyngeal exudate.  Eyes: Conjunctivae are normal. Pupils are equal, round, and reactive to light.  Neck: Normal range of motion. Neck supple. No thyromegaly present.  Cardiovascular: Normal rate, regular rhythm, normal heart sounds and intact distal pulses.  Exam reveals no gallop.   No murmur heard. Pulmonary/Chest: Effort normal and breath sounds normal. No respiratory distress. He has no wheezes. He has no rales.  Abdominal: Soft. There is no tenderness.  Musculoskeletal: He exhibits no edema or tenderness.  Lymphadenopathy:    He has no cervical adenopathy.  Neurological: He is alert and oriented to person, place, and time.  Skin: No rash noted. No erythema.  Psychiatric: He has a normal mood and affect. His behavior is normal.          Assessment & Plan:

## 2016-05-21 NOTE — Assessment & Plan Note (Signed)
Discussed trying the trazodone more often since it seems to prevent the AM nervous feeling

## 2016-05-21 NOTE — Progress Notes (Signed)
Pre visit review using our clinic review tool, if applicable. No additional management support is needed unless otherwise documented below in the visit note. 

## 2016-05-21 NOTE — Assessment & Plan Note (Signed)
Healthy but out of shape Discussed DASH diet Limited in exercise due to knee--discussed alternatives Will check PSA after discussion Colon due 2022

## 2016-05-21 NOTE — Assessment & Plan Note (Signed)
Chest symptoms sound like bloating Continues on the PPI

## 2016-08-05 ENCOUNTER — Other Ambulatory Visit: Payer: Self-pay | Admitting: Internal Medicine

## 2016-08-26 ENCOUNTER — Encounter: Payer: Self-pay | Admitting: Medical

## 2016-08-26 ENCOUNTER — Ambulatory Visit: Payer: Self-pay | Admitting: Medical

## 2016-08-26 VITALS — BP 150/80 | HR 92 | Temp 97.0°F | Resp 16 | Ht 68.0 in | Wt 220.0 lb

## 2016-08-26 DIAGNOSIS — E559 Vitamin D deficiency, unspecified: Secondary | ICD-10-CM

## 2016-08-26 DIAGNOSIS — W57XXXA Bitten or stung by nonvenomous insect and other nonvenomous arthropods, initial encounter: Secondary | ICD-10-CM

## 2016-08-26 DIAGNOSIS — R202 Paresthesia of skin: Secondary | ICD-10-CM

## 2016-08-26 MED ORDER — DOXYCYCLINE HYCLATE 100 MG PO TABS: 100.0000 mg | ORAL_TABLET | Freq: Two times a day (BID) | ORAL | 0 refills | Status: DC

## 2016-08-26 NOTE — Progress Notes (Signed)
   Subjective:    Patient ID: Jose Edwards, male    DOB: 04-05-1955, 61 y.o.   MRN: [id number]  HPI  61 yo bite by tick abouat 9 days ago, tick not swollen and though he had gotten it all out of his skin. Has noticed last Friday and noticed some red spots around where the tick bite occurred. Does not itch and no pain.   Has had some ear popping tried zyrtec but it made him tired.  Tingling in hands bilaterally, started two month ago and his thighs started about 2 weeks ago. Did have a physical with Dr. Alphonsus SiasLetvak 05/21/16. Labs were done. Years ago needed to  take Vitamin B after  lab test was low.  Restarted taking B12 3 days ago.  Denies any flu-like symptoms.    Review of Systems  Constitutional: Negative for chills, fatigue and fever.  HENT: Negative for congestion, ear pain and sore throat.   Eyes: Negative for discharge and itching.  Respiratory: Negative for cough.   Cardiovascular: Negative for chest pain.  Gastrointestinal: Negative for diarrhea, nausea and vomiting.  Endocrine: Negative for polyuria.  Genitourinary: Negative for hematuria.  Musculoskeletal: Negative for myalgias.  Skin: Positive for rash.  Allergic/Immunologic: Positive for environmental allergies. Negative for food allergies.  Neurological: Negative for syncope and headaches.  Hematological: Negative for adenopathy.  Psychiatric/Behavioral: Negative for confusion and hallucinations.       Objective:   Physical Exam  Constitutional: He is oriented to person, place, and time. He appears well-developed and well-nourished.  HENT:  Head: Normocephalic and atraumatic.  Eyes: EOM are normal. Pupils are equal, round, and reactive to light.  Neck: Normal range of motion. Neck supple.  Cardiovascular: Normal rate, regular rhythm and normal heart sounds.  Exam reveals no gallop and no friction rub.   No murmur heard. Pulmonary/Chest: Effort normal and breath sounds normal.  Neurological: He is alert and  oriented to person, place, and time.  Skin: Skin is warm and dry.  Psychiatric: He has a normal mood and affect. His behavior is normal.   Right upper arm medially with maculopapular area where tick was located and then rash of tiny spots about  5 areas. Mild erythema. Marked area for patient to watch. Total area measuring 4 x 2 cm. No adenopathy.       Assessment & Plan:  Tick bite with rash  Prescribed  Doxy  100 mg one by mouth twice daily x  7 days. #14 no refills.Will check patients  Vitamin D  And B 12  Level and folate. Totry to  take OTC Zyrtec at night for allergy symptoms and ear popping. To follow up on Friday for a recheck. Will review labs then with patient.

## 2016-08-26 NOTE — Patient Instructions (Signed)
Return Friday for recheck. Take Doxy as prescribed.  Clean area twice daily with dilute soap and water and apply a small amount of neosporin to the site.

## 2016-08-27 LAB — B12 AND FOLATE PANEL
Folate: 9.5 ng/mL (ref >3.0)
Vitamin B-12: 659 pg/mL (ref 232–1245)

## 2016-08-27 LAB — VITAMIN D 25 HYDROXY (VIT D DEFICIENCY, FRACTURES): Vit D, 25-Hydroxy: 26 ng/mL — ABNORMAL LOW (ref 30.0–100.0)

## 2016-08-28 ENCOUNTER — Telehealth: Payer: Self-pay

## 2016-08-29 ENCOUNTER — Encounter: Payer: Self-pay | Admitting: Medical

## 2016-08-29 ENCOUNTER — Ambulatory Visit: Payer: Self-pay | Admitting: Medical

## 2016-08-29 VITALS — BP 140/78 | HR 98 | Temp 97.4°F | Resp 16 | Ht 68.0 in

## 2016-08-29 DIAGNOSIS — R202 Paresthesia of skin: Secondary | ICD-10-CM

## 2016-08-29 DIAGNOSIS — W57XXXD Bitten or stung by nonvenomous insect and other nonvenomous arthropods, subsequent encounter: Secondary | ICD-10-CM

## 2016-08-29 DIAGNOSIS — E559 Vitamin D deficiency, unspecified: Secondary | ICD-10-CM

## 2016-08-29 NOTE — Progress Notes (Signed)
Sorry recheck would be 3 months.

## 2016-08-29 NOTE — Progress Notes (Signed)
Would you like a different dosage?

## 2016-08-29 NOTE — Progress Notes (Signed)
   Subjective:    Patient ID: Jose Edwards, male    DOB: 1955/11/03, 61 y.o.   MRN: [id number]  HPI   61 yo male returns today for follow up of tick bite on right upper arm, taking doxycycline  He states the area on the right upper arm feels better. His B12 and folate and Vit D were  checked  Here to review results. He has had a 3 month history of intermittent tingling in anterior thighs stopping above the knee. Hands tingling intermitttently 3-4 years ago then stopped in hands,  returned  about  1 year ago . Seems to have gradually gotten worse in his hands. 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 Occurs at its worse 5-6 times per day lasting about 5 minutes. He usually shakes his hands, and rubs them together to help with the tingling, seems to improve the tingling sensation.  Bilateral anterior thigh Intermittent tingling lasts a few  minutes and can occur as often as 3-4 times per day. Denies any weakness.   Review of Systems  Constitutional: Positive for fatigue. Negative for chills and fever.  HENT: Negative for congestion, ear pain and sore throat.   Eyes: Negative for discharge and itching.  Respiratory: Negative for cough and shortness of breath.   Cardiovascular: Negative for chest pain.  Gastrointestinal: Negative for diarrhea, nausea and vomiting.  Endocrine: Negative for polydipsia, polyphagia and polyuria.  Genitourinary: Negative for hematuria.  Musculoskeletal: Negative for myalgias.  Skin: Positive for rash.  Allergic/Immunologic: Positive for environmental allergies. Negative for food allergies.  Neurological: Negative for dizziness and syncope.  Hematological: Negative for adenopathy.  Psychiatric/Behavioral: Negative for confusion and hallucinations.   Rash improving at tick bite.     Objective:   Physical Exam  Constitutional: He is oriented to person, place, and time. He  appears well-developed and well-nourished.  HENT:  Head: Normocephalic and atraumatic.  Eyes: EOM are normal. Pupils are equal, round, and reactive to light.  Musculoskeletal: Normal range of motion.  Neurological: He is alert and oriented to person, place, and time.  Skin: Skin is warm and dry.     Right upper medial arm improved reduction in rash. Mild erythema , much improved from 3 days ago. Color of hands wnl,  5/5 strength, 2+ radial and ulnar pulse bilaterally.  5/5 strength. Skin within normal limits on thighs bilaterally.    Assessment & Plan:  Tick bite improving, finsh doxycycline. Continues to do wound care as previously as reviewed. Return to clinic if flu -like to return to the clinic for reevaluation, reviewed with the patient.  Vitamin D deficiency Will discuss with Dr. Alphonsus SiasLetvak patient primary doctor. Reviewed tingling of hands and anterior thighs with Dr. Wonda OldsGilberts agrees on neurology referral. Return to the clinic as needed.

## 2016-09-01 ENCOUNTER — Encounter: Payer: Self-pay | Admitting: Neurology

## 2016-09-01 NOTE — Progress Notes (Signed)
Will tell patient 1000IU/day and to follow up with you as needed.

## 2016-09-11 NOTE — Progress Notes (Signed)
[facility] Clinic Note - Initial Visit   Date: 09/12/16  Jose Edwards MRN: [id number] DOB: 1955/06/24   Dear Dr. Lannie Fields:  Thank you for your kind referral of Jose Edwards for consultation of generalized paresthesias. Although his history is well known to you, please allow [place] to reiterate it for the purpose of our medical record. The patient was accompanied to the clinic by wife who also provides collateral information.     History of Present Illness: Jose Edwards is a 61 y.o. right-handed Caucasian male with BPPV, GERD, and osteoarthritis presenting for evaluation of paresthesias of the hands and thighs.    For the past 2 years, he has noticed intermittent numbness and tingling of the hands. He sometimes wakes up at night time to shake is hands and has tried wearing a wrist splint which helps some, usually symptoms occur together.  He denies any weakness of the hands or reduced grip strength.   He also complains of numbness and tingling over the anterior thighs which has been present since early 2018.  It is worse with prolonged standing and improved with walking.  It usually occurs 4-5 times per day.  His legs do not bother him at rest or at night time.  He denies any weakness.  He endorses low back pain.  He does not wear any utility belt, but tends to keep phone on his clothing belt. He admits to gaining about 40lb over the past several years.   Out-side paper records, electronic medical record, and images have been reviewed where available and summarized as:  Lab Results  Component Value Date   TSH 1.41 02/27/2010   Lab Results  Component Value Date   VITAMINB12 659 08/26/2016     Past Medical History:  Diagnosis Date  . Benign positional vertigo   . ED (erectile dysfunction)   . GERD (gastroesophageal reflux disease)   . Osteoarthrosis, unspecified whether generalized or localized, unspecified site     Past  Surgical History:  Procedure Laterality Date  . ANKLE SURGERY  2008   fracture left ankle ( 2 pins)  . ROTATOR CUFF REPAIR  2005   right shoulder     Medications:  Outpatient Encounter Prescriptions as of 09/12/2016  Medication Sig  . cholecalciferol (VITAMIN D) 1000 UNITS tablet Take 2,000 Units by mouth daily.  . meloxicam (MOBIC) 15 MG tablet Take 15 mg by mouth daily as needed.   Marland Kitchen omeprazole (PRILOSEC) 20 MG capsule Take 20 mg by mouth daily.  . traZODone (DESYREL) 50 MG tablet TAKE 1/2 TO 1 (ONE-HALF TO ONE) TABLET BY MOUTH AT BEDTIME AS NEEDED FOR SLEEP  . vitamin B-12 (CYANOCOBALAMIN) 100 MCG tablet Take 100 mcg by mouth daily.  . [DISCONTINUED] doxycycline (VIBRA-TABS) 100 MG tablet Take 1 tablet (100 mg total) by mouth 2 (two) times daily.   No facility-administered encounter medications on file as of 09/12/2016.      Allergies:  Allergies  Allergen Reactions  . Gabapentin Other (See Comments)  . Pseudoephedrine     REACTION: makes drowsy    Family History: Family History  Problem Relation Age of Onset  . Healthy Mother   . Diabetes Sister   . Diabetes Paternal Grandmother   . Coronary artery disease Neg Hx   . Hypertension Neg Hx   . Cancer Neg Hx        prostate    Social History: Social History  Substance Use Topics  . Smoking status: Never  Smoker  . Smokeless tobacco: Never Used  . Alcohol use Yes   Social History   Social History Narrative   Lives with wife in a one story home.  Has 3 sons.  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  Education: high school.    Review of Systems:  CONSTITUTIONAL: No fevers, chills, night sweats, or weight loss.   EYES: No visual changes or eye pain ENT: No hearing changes.  No history of nose bleeds.   RESPIRATORY: No cough, wheezing and shortness of breath.   CARDIOVASCULAR: Negative for chest pain, and palpitations.   GI: Negative for abdominal discomfort, blood in stools or black stools.  No recent  change in bowel habits.   GU:  No history of incontinence.   MUSCLOSKELETAL: No history of joint pain or swelling.  No myalgias.   SKIN: Negative for lesions, rash, and itching.   HEMATOLOGY/ONCOLOGY: Negative for prolonged bleeding, bruising easily, and swollen nodes.  No history of cancer.   ENDOCRINE: Negative for cold or heat intolerance, polydipsia or goiter.   PSYCH:  No depression or anxiety symptoms.   NEURO: As Above.   Vital Signs:  BP 140/80   Pulse 80   Ht 5\' 8"  (1.727 m)   Wt 219 lb 8 oz (99.6 kg)   SpO2 97%   BMI 33.37 kg/m    General Medical Exam:   General:  Well appearing, comfortable.   Eyes/ENT: see cranial nerve examination.   Neck: No masses appreciated.  Full range of motion without tenderness.  No carotid bruits. Respiratory:  Clear to auscultation, good air entry bilaterally.   Cardiac:  Regular rate and rhythm, no murmur.   Extremities:  No deformities, edema, or skin discoloration.  Skin:  No rashes or lesions.  Neurological Exam: MENTAL STATUS including orientation to time, place, person, recent and remote memory, attention span and concentration, language, and fund of knowledge is normal.  Speech is not dysarthric.  CRANIAL NERVES: II:  No visual field defects.  Unremarkable fundi.   III-IV-VI: Pupils equal round and reactive to light.  Normal conjugate, extra-ocular eye movements in all directions of gaze.  No nystagmus.  No ptosis.   V:  Normal facial sensation.     VII:  Normal facial symmetry and movements.  VIII:  Normal hearing and vestibular function.   IX-X:  Normal palatal movement.   XI:  Normal shoulder shrug and head rotation.   XII:  Normal tongue strength and range of motion, no deviation or fasciculation.  MOTOR:  No atrophy, fasciculations or abnormal movements.  No pronator drift.  Tone is normal.    Right Upper Extremity:    Left Upper Extremity:    Deltoid  5/5   Deltoid  5/5   Biceps  5/5   Biceps  5/5   Triceps  5/5    Triceps  5/5   Wrist extensors  5/5   Wrist extensors  5/5   Wrist flexors  5/5   Wrist flexors  5/5   Finger extensors  5/5   Finger extensors  5/5   Finger flexors  5/5   Finger flexors  5/5   Dorsal interossei  5/5   Dorsal interossei  5/5   Abductor pollicis  5/5   Abductor pollicis  5/5   Tone (Ashworth scale)  0  Tone (Ashworth scale)  0   Right Lower Extremity:    Left Lower Extremity:    Hip flexors  5/5   Hip flexors  5/5   Hip extensors  5/5   Hip extensors  5/5   Knee flexors  5/5   Knee flexors  5/5   Knee extensors  5/5   Knee extensors  5/5   Dorsiflexors  5/5   Dorsiflexors  5/5   Plantarflexors  5/5   Plantarflexors  5/5   Toe extensors  5/5   Toe extensors  5/5   Toe flexors  5/5   Toe flexors  5/5   Tone (Ashworth scale)  0  Tone (Ashworth scale)  0   MSRs:  Right                                                                 Left brachioradialis 2+  brachioradialis 2+  biceps 2+  biceps 2+  triceps 2+  triceps 2+  patellar 2+  patellar 2+  ankle jerk 2+  ankle jerk 2+  Hoffman no  Hoffman no  plantar response down  plantar response down   SENSORY:  Reduced temperature and pin prick over the anterolateral thigh bilaterally.  In the arms, there is mildly reduced pin prick over the median distribution. Tinel's sign is positive bilaterally at the wrist.  COORDINATION/GAIT: Normal finger-to- nose-finger.  Intact rapid alternating movements bilaterally.  Able to rise from a chair without using arms.  Gait narrow based and stable. Tandem and stressed gait intact.    IMPRESSION: 1.  Bilateral hand paresthesias, most likely median nerve entrapment at the wrist (carpal tunnel syndrome)  - NCS/EMG of the upper extremities to determine severity.  If EDX confirmed moderate to severe CTS, will refer to [facility]  - Encouraged to use wrist splint bilaterally  - Gabapentin declined  2.  Meralgia paresthetica bilaterally  - Weight loss encouraged  - Avoid tight  fitting belts   - Ok to use OTC lidocaine ointment as needed  Return to clinic in 6 months.   The duration of this appointment visit was 45 minutes of face-to-face time with the patient.  Greater than 50% of this time was spent in counseling, explanation of diagnosis, planning of further management, and coordination of care.   Thank you for allowing me to participate in patient's care.  If I can answer any additional questions, I would be pleased to do so.    Sincerely,    Sharvil Hoey K. Allena Katz, DO

## 2016-09-12 ENCOUNTER — Ambulatory Visit: Payer: Self-pay | Admitting: Neurology

## 2016-09-12 ENCOUNTER — Encounter: Payer: Self-pay | Admitting: Neurology

## 2016-09-12 ENCOUNTER — Ambulatory Visit (INDEPENDENT_AMBULATORY_CARE_PROVIDER_SITE_OTHER): Payer: BLUE CROSS/BLUE SHIELD | Admitting: Neurology

## 2016-09-12 VITALS — BP 140/80 | HR 80 | Ht 68.0 in | Wt 219.5 lb

## 2016-09-12 DIAGNOSIS — G5603 Carpal tunnel syndrome, bilateral upper limbs: Secondary | ICD-10-CM

## 2016-09-12 DIAGNOSIS — G5713 Meralgia paresthetica, bilateral lower limbs: Secondary | ICD-10-CM | POA: Diagnosis not present

## 2016-09-12 NOTE — Patient Instructions (Addendum)
1.  NCS/EMG of the arms.  We will call you with the results and let you know if you need to see a Hand Surgeon 2.  Start using a wrist splint  3.  Weight loss encouraged  4.  Avoid tight fitting clothing/belts 5.  Start using over the counter lidocaine ointment as needed (Aspercream, Salonpas)  Return to clinic 6 months

## 2016-09-23 NOTE — Telephone Encounter (Signed)
Attempted to call patient, line was busy.

## 2016-09-30 ENCOUNTER — Telehealth: Payer: Self-pay | Admitting: Neurology

## 2016-09-30 ENCOUNTER — Ambulatory Visit (INDEPENDENT_AMBULATORY_CARE_PROVIDER_SITE_OTHER): Payer: BLUE CROSS/BLUE SHIELD | Admitting: Neurology

## 2016-09-30 DIAGNOSIS — G5713 Meralgia paresthetica, bilateral lower limbs: Secondary | ICD-10-CM

## 2016-09-30 DIAGNOSIS — G5603 Carpal tunnel syndrome, bilateral upper limbs: Secondary | ICD-10-CM

## 2016-09-30 NOTE — Procedures (Signed)
[facility]  [address] [place], [address]  [place], [place] [address] Tel: [phone] Fax:  [phone] Test Date:  09/30/2016  Patient: Jose Edwards DOB: 04-22-1955 Physician: Nita Sickle, DO  Sex: Male Height: 5\' 8"  Ref Phys: Nita Sickle, DO  ID#: [id number] Temp: 36.1C Technician:    Patient Complaints: This is a 61 year-old gentleman referred for evaluation of bilateral hand paresthesias.  NCV & EMG Findings: Extensive electrodiagnostic testing of the right upper extremity and additional studies of the left shows:  1. Bilateral median sensory responses show prolonged latency (R5.3, L5.1 ms) and reduced amplitude (R5.1, L6.2 V).  Bilateral ulnar sensory responses are within normal limits. 2. Bilateral median motor responses show prolonged latency (R5.5, L6.2 ms).  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  The left ulnar motor response ultrasound shows conduction velocity slowing across the elbow (A Elbow-B Elbow, 43 m/s).  The right ulnar motor responses within normal limits. 3. Sparse chronic motor axon loss changes were isolated to bilateral abductor pollicis brevis muscles, without accompanied active denervation.   Impression: 1. Bilateral median neuropathy at or distal to the wrist, consistent with the clinical diagnosis of carpal tunnel syndrome. Overall, these findings are severe in degree electrically. 2. Left ulnar neuropathy with slowing across the elbow, purely demyelinating in type. 3. Incidentally, there is a Martin-Gruber anastomosis bilaterally, a normal variant.   ___________________________ Nita Sickle, DO    Nerve Conduction Studies Anti Sensory Summary Table   Stim Site NR Peak (ms) Norm Peak (ms) P-T Amp (V) Norm P-T Amp  Left Median Anti Sensory (2nd Digit)  Wrist    5.1 <3.8 6.2 >10    Right Median Anti Sensory (2nd Digit)  Wrist    5.3 <3.8 5.1 >10  Left Ulnar Anti Sensory (5th Digit)  Wrist    2.6 <3.2 8.8 >5  Right Ulnar Anti Sensory (5th Digit)  Wrist    2.6 <3.2 9.4 >5   Motor Summary Table   Stim Site NR Onset (ms) Norm Onset (ms) O-P Amp (mV) Norm O-P Amp Site1 Site2 Delta-0 (ms) Dist (cm) Vel (m/s) Norm Vel (m/s)  Left Median Motor (Abd Poll Brev)  Wrist    6.2 <4.0 2.7 >5 Elbow Wrist 3.3 27.0 82 >50  Elbow    9.5  4.3  Ulnar-wrist crossover Elbow 5.6 0.0    Ulnar-wrist crossover    3.9  1.6         Right Median Motor (Abd Poll Brev)  Wrist    5.5 <4.0 5.3 >5 Elbow Wrist 4.1 25.0 61 >50  Elbow    9.6  4.5  Ulnar-wrist crossover Elbow 6.1 0.0    Ulnar-wrist crossover    3.5  3.1         Left Ulnar Motor (Abd Dig Minimi)  Wrist    2.1 <3.1 7.7 >7 B Elbow Wrist 3.4 25.0 74 >50  B Elbow    5.5  6.7  A Elbow B Elbow 2.3 10.0 43 >50  A Elbow    7.8  6.3         Right Ulnar Motor (Abd Dig Minimi)  Wrist    2.1 <3.1 8.3 >7 B Elbow Wrist 3.8 22.0 58 >50  B Elbow    5.9  8.0  A Elbow B Elbow 2.0 10.0 50 >50  A Elbow    7.9  7.6          EMG   Side Muscle Ins Act Fibs Psw Fasc Number Recrt Dur Dur. Amp Amp. Poly Poly. Comment  Left 1stDorInt Nml Nml Nml Nml Nml Nml Nml Nml Nml Nml Nml Nml N/A  Left Abd Poll Brev Nml Nml Nml Nml 1- Rapid Few 1+ Few 1+ Nml Nml N/A  Left Ext Indicis Nml Nml Nml Nml Nml Nml Nml Nml Nml Nml Nml Nml N/A  Left PronatorTeres Nml Nml Nml Nml Nml Nml Nml Nml Nml Nml Nml Nml N/A  Left Biceps Nml Nml Nml Nml Nml Nml Nml Nml Nml Nml Nml Nml N/A  Left Triceps Nml Nml Nml Nml Nml Nml Nml Nml Nml Nml Nml Nml N/A  Left Deltoid Nml Nml Nml Nml Nml Nml Nml Nml Nml Nml Nml Nml N/A  Left ABD Dig Min Nml Nml Nml Nml Nml Nml Nml Nml Nml Nml Nml Nml N/A  Left FlexCarpiUln Nml Nml Nml Nml Nml Nml Nml Nml Nml Nml Nml Nml N/A  Right 1stDorInt Nml Nml Nml Nml Nml Nml Nml Nml Nml Nml Nml Nml N/A  Right Abd Poll Brev Nml Nml Nml Nml 1- Rapid Few 1+ Few 1+  Nml Nml N/A  Right PronatorTeres Nml Nml Nml Nml Nml Nml Nml Nml Nml Nml Nml Nml N/A  Right Biceps Nml Nml Nml Nml Nml Nml Nml Nml Nml Nml Nml Nml N/A  Right Triceps Nml Nml Nml Nml Nml Nml Nml Nml Nml Nml Nml Nml N/A  Right Deltoid Nml Nml Nml Nml Nml Nml Nml Nml Nml Nml Nml Nml N/A      Waveforms:

## 2016-09-30 NOTE — Telephone Encounter (Signed)
Discuss results of EMG with patient which shows bilateral carpal tunnel syndrome and left ulnar neuropathy is likely less symptomatic. I would recommend that he see a hand orthopedic surgeon for surgical evaluation. He will call with the name of the surgeon that past in [place].  Donika K. Allena KatzPatel, DO

## 2016-10-02 NOTE — Telephone Encounter (Signed)
Noted  

## 2016-11-05 DIAGNOSIS — G5601 Carpal tunnel syndrome, right upper limb: Secondary | ICD-10-CM | POA: Diagnosis not present

## 2016-12-12 ENCOUNTER — Ambulatory Visit: Payer: Self-pay | Admitting: Neurology

## 2017-01-16 DIAGNOSIS — G5601 Carpal tunnel syndrome, right upper limb: Secondary | ICD-10-CM | POA: Diagnosis not present

## 2017-03-16 ENCOUNTER — Ambulatory Visit: Payer: Self-pay | Admitting: Neurology

## 2017-07-21 NOTE — Progress Notes (Signed)
Follow-up Visit   Date: 07/22/17    Jose Edwards MRN: [id number] DOB: [birth year]   Interim History: Jose CollarJimmy Davies Edwards is a 62 y.o. right-handed Caucasian male with BPPV, GERD, and osteoarthritis returning to the clinic for follow-up of meralgia paresthetica and bilateral CTS.  The patient was accompanied to the clinic by self.  History of present illness: For the past 2 years, he has noticed intermittent numbness and tingling of the hands. He sometimes wakes up at night time to shake is hands and has tried wearing a wrist splint which helps some, usually symptoms occur together.  He denies any weakness of the hands or reduced grip strength.   He also complains of numbness and tingling over the anterior thighs which has been present since early 2018.  It is worse with prolonged standing and improved with walking.  It usually occurs 4-5 times per day.  His legs do not bother him at rest or at night time.  He denies any weakness.  He endorses low back pain.  He does not wear any utility belt, but tends to keep phone on his clothing belt. He admits to gaining about 40lb over the past several years.   UPDATE 07/22/2017:  He is here for follow-up visit.  He was evaluated in July 2018 with NCS/EMG which showed severe bilateral CTS and had right CTS release in the fall of 2018, which has resolved his numbness/tingling. Interestingly, he also noticed less tingling of the left hand, which happens only when driving for longer periods of time.  He has lost about 15lb over the past year and wears his belt less tight which has helped his thigh numbness.  No new complaints.   Medications:  Current Outpatient Medications on File Prior to Visit  Medication Sig Dispense Refill  . cholecalciferol (VITAMIN D) 1000 UNITS tablet Take 2,000 Units by mouth 2 (two) times a week.     Marland Kitchen. omeprazole (PRILOSEC) 20 MG capsule Take 20 mg by mouth daily.    . traZODone (DESYREL) 50 MG tablet TAKE 1/2 TO 1  (ONE-HALF TO ONE) TABLET BY MOUTH AT BEDTIME AS NEEDED FOR SLEEP 30 tablet 10  . vitamin B-12 (CYANOCOBALAMIN) 100 MCG tablet Take 100 mcg by mouth every other day.      No current facility-administered medications on file prior to visit.     Allergies:  Allergies  Allergen Reactions  . Gabapentin Other (See Comments)  . Pseudoephedrine     REACTION: makes drowsy    Review of Systems:  CONSTITUTIONAL: No fevers, chills, night sweats, or weight loss.  EYES: No visual changes or eye pain ENT: No hearing changes.  No history of nose bleeds.   RESPIRATORY: No cough, wheezing and shortness of breath.   CARDIOVASCULAR: Negative for chest pain, and palpitations.   GI: Negative for abdominal discomfort, blood in stools or black stools.  No recent change in bowel habits.   GU:  No history of incontinence.   MUSCLOSKELETAL: +history of joint pain or swelling.  No myalgias.   SKIN: Negative for lesions, rash, and itching.   ENDOCRINE: Negative for cold or heat intolerance, polydipsia or goiter.   PSYCH:  No depression or anxiety symptoms.   NEURO: As Above.   Vital Signs:  BP 140/70   Pulse (!) 104   Ht 5\' 8"  (1.727 m)   Wt 221 lb 6 oz (100.4 kg)   SpO2 98%   BMI 33.66 kg/m    General: Well appearing,  comfortable Neck: No carotid bruit CV: RRR Pulm:  Clear Ext: No edema, well healed scar from R CTS release.  Varicose veins in the left > right leg.  Neurological Exam: MENTAL STATUS including orientation to time, place, person, recent and remote memory, attention span and concentration, language, and fund of knowledge is normal.  Speech is not dysarthric.  CRANIAL NERVES:  Pupils equal round and reactive to light.  Normal conjugate, extra-ocular eye movements in all directions of gaze.  No ptosis.  Face is symmetric. Palate elevates symmetrically.  Tongue is midline.  MOTOR:  Motor strength is 5/5 in all extremities, except trace weakness of bilateral ABP.  No atrophy,  fasciculations or abnormal movements.  No pronator drift.  Tone is normal.    MSRs:  Reflexes are 2+/4 throughout.  SENSORY:  Pin prick intact on the right, mildly reduced over the medial distribution on the left. Tinel's sign is negative.  COORDINATION/GAIT:   Gait narrow based and stable.   Data: NCS/EMG of the arms 09/30/2016: 1. Bilateral median neuropathy at or distal to the wrist, consistent with the clinical diagnosis of carpal tunnel syndrome. Overall, these findings are severe in degree electrically. 2. Left ulnar neuropathy with slowing across the elbow, purely demyelinating in type. 3. Incidentally, there is a Martin-Gruber anastomosis bilaterally, a normal variant.   IMPRESSION/PLAN 1.  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  I have recommended that he use a wrist splint for the left wrist until he has made a decision regarding CTS release on the left.    2.  Bilateral meralgia paresthetica which has resolved after weight loss and wearing his belt a little looser.  Encouraged him to continue healthy lifestyle changes.  3.  Left ulnar neuropathy, asymptomatic.  Return to clinic as needed  Thank you for allowing me to participate in patient's care.  If I can answer any additional questions, I would be pleased to do so.    Sincerely,    Afomia Blackley K. Allena Katz, DO

## 2017-07-22 ENCOUNTER — Ambulatory Visit: Payer: BLUE CROSS/BLUE SHIELD | Admitting: Neurology

## 2017-07-22 ENCOUNTER — Encounter: Payer: Self-pay | Admitting: Neurology

## 2017-07-22 VITALS — BP 140/70 | HR 104 | Ht 68.0 in | Wt 221.4 lb

## 2017-07-22 DIAGNOSIS — G5603 Carpal tunnel syndrome, bilateral upper limbs: Secondary | ICD-10-CM

## 2017-07-22 DIAGNOSIS — G5713 Meralgia paresthetica, bilateral lower limbs: Secondary | ICD-10-CM

## 2017-07-22 NOTE — Patient Instructions (Signed)
Great to see you today!  Come back and see me for any new neurological concerns.

## 2017-07-29 ENCOUNTER — Ambulatory Visit: Payer: Self-pay | Admitting: Neurology

## 2017-07-29 ENCOUNTER — Encounter

## 2017-08-12 DIAGNOSIS — H903 Sensorineural hearing loss, bilateral: Secondary | ICD-10-CM | POA: Diagnosis not present

## 2017-08-12 DIAGNOSIS — H9319 Tinnitus, unspecified ear: Secondary | ICD-10-CM | POA: Diagnosis not present

## 2017-08-12 DIAGNOSIS — H8110 Benign paroxysmal vertigo, unspecified ear: Secondary | ICD-10-CM | POA: Diagnosis not present

## 2017-08-19 DIAGNOSIS — R42 Dizziness and giddiness: Secondary | ICD-10-CM | POA: Diagnosis not present

## 2017-08-19 DIAGNOSIS — H8110 Benign paroxysmal vertigo, unspecified ear: Secondary | ICD-10-CM | POA: Diagnosis not present

## 2017-08-27 DIAGNOSIS — H8110 Benign paroxysmal vertigo, unspecified ear: Secondary | ICD-10-CM | POA: Diagnosis not present

## 2017-08-27 DIAGNOSIS — R42 Dizziness and giddiness: Secondary | ICD-10-CM | POA: Diagnosis not present

## 2017-09-24 ENCOUNTER — Encounter: Payer: Self-pay | Admitting: Adult Health

## 2017-09-24 ENCOUNTER — Ambulatory Visit: Payer: Self-pay | Admitting: Adult Health

## 2017-09-24 VITALS — BP 168/87 | HR 87 | Temp 97.6°F | Resp 16 | Ht 68.0 in | Wt 221.0 lb

## 2017-09-24 DIAGNOSIS — J3081 Allergic rhinitis due to animal (cat) (dog) hair and dander: Secondary | ICD-10-CM

## 2017-09-24 DIAGNOSIS — J019 Acute sinusitis, unspecified: Secondary | ICD-10-CM

## 2017-09-24 DIAGNOSIS — M2390 Unspecified internal derangement of unspecified knee: Secondary | ICD-10-CM | POA: Insufficient documentation

## 2017-09-24 DIAGNOSIS — H6983 Other specified disorders of Eustachian tube, bilateral: Secondary | ICD-10-CM

## 2017-09-24 MED ORDER — AMOXICILLIN-POT CLAVULANATE 875-125 MG PO TABS: 1.0000 | ORAL_TABLET | Freq: Two times a day (BID) | ORAL | 0 refills | Status: DC

## 2017-09-24 MED ORDER — PREDNISONE 10 MG (21) PO TBPK: ORAL_TABLET | ORAL | 0 refills | Status: DC

## 2017-09-24 MED ORDER — FLUTICASONE PROPIONATE 50 MCG/ACT NA SUSP: 2.0000 | Freq: Every day | NASAL | 2 refills | Status: AC

## 2017-09-24 MED ORDER — SALINE SPRAY 0.65 % NA SOLN: 1.0000 | NASAL | 4 refills | Status: DC | PRN

## 2017-09-24 NOTE — Progress Notes (Signed)
Subjective:     Patient ID: Jose Edwards, male   DOB: 09-04-1955, 62 y.o.   MRN: [id number]  HPI   Blood pressure (!) 168/87, pulse 87, temperature 97.6 F (36.4 C), resp. rate 16, height 5\' 8"  (1.727 m), weight 221 lb (100.2 kg), SpO2 97 %.  Patient is a 62 year old male in no acute distress who comes to the clinic in no acute distress with complaints of sore throat, sinus congestion, sinus and ear pressure x 2  1/2 weeks. Mild fatigue.   Has tried Sudafed. - Advsied no sudafed as blood pressure elevated. He reports it is usually normal.  He reports that prior to starting with the symptoms that he was in a dorm room cleaning and was exposed to cat dander which that his allergies off coughing and sneezing and sore throat but that resolved after about 2 days and then this started around 1 week later.  He has seen ENT in past for vertigo and eustachian tube dysfunction. Denies any vertigo related symptoms. now.   He tried Zyrtec and it kept him awake at night so he did not continue it.    Patient  denies any fever, body aches,chills, rash, chest pain, shortness of breath, nausea, vomiting, or diarrhea.     Review of Systems  Constitutional: Positive for fatigue. Negative for activity change, appetite change, chills, diaphoresis, fever and unexpected weight change.  HENT: Positive for congestion, ear pain (bilateral ear pressure ), postnasal drip, rhinorrhea, sinus pressure and sore throat. Negative for dental problem, drooling, ear discharge and trouble swallowing (painful swalowing ).   All other systems reviewed and are negative.      Objective:   Physical Exam  Constitutional: He is oriented to person, place, and time. Vital signs are normal. He appears well-developed and well-nourished. He is active.  Non-toxic appearance. He does not have a sickly appearance. He does not appear ill. No distress.  Patient is alert and oriented and responsive to questions Engages in eye  contact with provider. Speaks in full sentences without any pauses without any shortness of breath or distress.    HENT:  Head: Normocephalic and atraumatic.  Right Ear: Hearing, external ear and ear canal normal. Tympanic membrane is not perforated and not erythematous. A middle ear effusion is present.  Left Ear: Hearing, external ear and ear canal normal. Tympanic membrane is erythematous. Tympanic membrane is not perforated. A middle ear effusion is present.  Nose: Mucosal edema and rhinorrhea present. Right sinus exhibits maxillary sinus tenderness. Right sinus exhibits no frontal sinus tenderness. Left sinus exhibits maxillary sinus tenderness. Left sinus exhibits no frontal sinus tenderness.  Mouth/Throat: Uvula is midline, oropharynx is clear and moist and mucous membranes are normal. No oropharyngeal exudate, posterior oropharyngeal edema, posterior oropharyngeal erythema or tonsillar abscesses. No tonsillar exudate.   Cobblestoning posterior pharynx; bilateral allergic shiners; bilateral TMs air fluid level yellow; bilateral nasal turbinates moderate edema erythema with yellow post nasal discharge;     Eyes: Pupils are equal, round, and reactive to light. Conjunctivae, EOM and lids are normal. Right eye exhibits no discharge. Left eye exhibits no discharge. No scleral icterus.  Watery eyes bilateral   Neck: Trachea normal, normal range of motion, full passive range of motion without pain and phonation normal. Neck supple. No JVD present. No tracheal tenderness present. No tracheal deviation present.  Cardiovascular: Normal rate, regular rhythm, normal heart sounds and intact distal pulses. Exam reveals no gallop and no friction rub.  No  murmur heard. Pulmonary/Chest: Effort normal and breath sounds normal. No stridor. No respiratory distress. He has no wheezes. He has no rales. He exhibits no tenderness.  Abdominal: Soft. Bowel sounds are normal.  Musculoskeletal: Normal range of motion.   Lymphadenopathy:    He has no cervical adenopathy.  Neurological: He is alert and oriented to person, place, and time. He displays normal reflexes. No cranial nerve deficit. He exhibits normal muscle tone. Coordination normal.  Skin: Skin is warm and dry. Capillary refill takes less than 2 seconds. No rash noted. He is not diaphoretic. No erythema. No pallor.  Psychiatric: He has a normal mood and affect. His behavior is normal. Judgment and thought content normal.  Vitals reviewed.      Assessment:     Acute non-recurrent sinusitis, unspecified location  Dysfunction of both eustachian tubes  Allergic rhinitis due to animal hair and dander      Plan:     Meds ordered this encounter  Medications  . predniSONE (STERAPRED UNI-PAK 21 TAB) 10 MG (21) TBPK tablet    Sig: PO: Take 6 tablets on day 1:Take 5 tablets day 2:Take 4 tablets day 3: Take 3 tablets day 4:Take 2 tablets day five: 5 Take 1 tablet day 6    Dispense:  21 tablet    Refill:  0  . amoxicillin-clavulanate (AUGMENTIN) 875-125 MG tablet    Sig: Take 1 tablet by mouth 2 (two) times daily.    Dispense:  20 tablet    Refill:  0  . sodium chloride (OCEAN) 0.65 % SOLN nasal spray    Sig: Place 1 spray into both nostrils as needed for congestion.    Dispense:  1 Bottle    Refill:  4     Start Claritin 10 mg daily - take in the morning if keeps you awake - take at bedtime if causes drowsiness.   Advised patient call the office or your primary care doctor for an appointment if no improvement within 72 hours or if any symptoms change or worsen at any time  Advised ER or urgent Care if after hours or on weekend. Call 911 for emergency symptoms at any time.Patinet verbalized understanding of all instructions given/reviewed and treatment plan and has no further questions or concerns at this time.    Patient verbalized understanding of all instructions given and denies any further questions at this time.

## 2017-09-24 NOTE — Patient Instructions (Signed)

## 2018-02-04 ENCOUNTER — Ambulatory Visit: Payer: Self-pay | Admitting: Adult Health

## 2018-02-04 ENCOUNTER — Encounter: Payer: Self-pay | Admitting: Adult Health

## 2018-02-04 VITALS — BP 162/84 | HR 96 | Temp 98.3°F | Resp 16 | Ht 68.0 in | Wt 227.0 lb

## 2018-02-04 DIAGNOSIS — H6993 Unspecified Eustachian tube disorder, bilateral: Secondary | ICD-10-CM

## 2018-02-04 DIAGNOSIS — H6983 Other specified disorders of Eustachian tube, bilateral: Secondary | ICD-10-CM

## 2018-02-04 DIAGNOSIS — J0111 Acute recurrent frontal sinusitis: Secondary | ICD-10-CM

## 2018-02-04 MED ORDER — PREDNISONE 10 MG (21) PO TBPK: ORAL_TABLET | ORAL | 0 refills | Status: DC

## 2018-02-04 MED ORDER — AMOXICILLIN-POT CLAVULANATE 875-125 MG PO TABS: 1.0000 | ORAL_TABLET | Freq: Two times a day (BID) | ORAL | 0 refills | Status: DC

## 2018-02-04 NOTE — Progress Notes (Signed)
Subjective:     Patient ID: Jose Edwards, male   DOB: [birth year], 62 y.o.   MRN: [id number]  HPI  Blood pressure (!) 162/84, pulse 96, temperature 98.3 F (36.8 C), resp. rate 16, height 5\' 8"  (1.727 m), weight 227 lb (103 kg), SpO2 98 %.  Patient is a 62 year old male in no acute distress who comes to the clinic with sinus congestion, ears popping bilaterally, ear pressure.   Patient  denies any fever, body aches,chills, rash, chest pain, shortness of breath, nausea, vomiting, or diarrhea.   He had sinusitis in June and completely resolved.  Using Flonase.   History of Vertigo in past. He reports that the pesron he saw said he may need tubes in his ears. He saw [facility].  No vertigo currently.  Denies any ill exposures or recent illness or hospitalizations.    Review of Systems  Constitutional: Negative.   HENT: Positive for congestion, ear pain, postnasal drip, rhinorrhea, sinus pressure and sore throat. Negative for dental problem, drooling, ear discharge, facial swelling, hearing loss, mouth sores, nosebleeds, sinus pain, sneezing, tinnitus, trouble swallowing and voice change.   Eyes: Negative.   Respiratory: Negative.   Cardiovascular: Negative.   Gastrointestinal: Negative.   Endocrine: Negative.   Genitourinary: Negative.   Musculoskeletal: Negative.   Skin: Negative.   Neurological: Negative.   Hematological: Negative.   Psychiatric/Behavioral: Negative.        Objective:   Physical Exam  Constitutional: He is oriented to person, place, and time. Vital signs are normal. He appears well-developed and well-nourished. He is active. No distress.  HENT:  Head: Normocephalic and atraumatic.  Right Ear: Hearing normal. Tympanic membrane is not perforated, not erythematous, not retracted and not bulging. A middle ear effusion is present.  Left Ear: Hearing normal. Tympanic membrane is bulging (mild ). Tympanic membrane is not perforated, not erythematous and  not retracted. A middle ear effusion is present.  Nose: Mucosal edema and rhinorrhea present. Right sinus exhibits frontal sinus tenderness. Right sinus exhibits no maxillary sinus tenderness. Left sinus exhibits frontal sinus tenderness. Left sinus exhibits no maxillary sinus tenderness.  Mouth/Throat: Uvula is midline, oropharynx is clear and moist and mucous membranes are normal. No oropharyngeal exudate. Tonsils are 0 on the right.  Eyes: Pupils are equal, round, and reactive to light. Conjunctivae, EOM and lids are normal. Right eye exhibits no discharge. Left eye exhibits no discharge. No scleral icterus.  Neck: Trachea normal, normal range of motion, full passive range of motion without pain and phonation normal. Neck supple. No JVD present. No tracheal deviation present. No Brudzinski's sign noted.  Cardiovascular: Normal rate, regular rhythm, normal heart sounds and intact distal pulses. Exam reveals no gallop and no friction rub.  No murmur heard. Pulmonary/Chest: Breath sounds normal. No accessory muscle usage or stridor. No respiratory distress. He has no wheezes. He has no rales. He exhibits no tenderness.  Abdominal: Soft. Bowel sounds are normal.  Musculoskeletal: Normal range of motion.  Lymphadenopathy:       Head (right side): No submental, no submandibular, no tonsillar, no preauricular, no posterior auricular and no occipital adenopathy present.       Head (left side): No submental, no submandibular, no tonsillar, no preauricular, no posterior auricular and no occipital adenopathy present.    He has no cervical adenopathy.  Neurological: He is alert and oriented to person, place, and time. He has normal strength. He displays normal reflexes. No cranial nerve deficit. He exhibits  normal muscle tone. Coordination normal.  Skin: Skin is warm, dry and intact. Capillary refill takes less than 2 seconds. No rash noted. He is not diaphoretic. No erythema. No pallor. Nails show no  clubbing.  Psychiatric: He has a normal mood and affect. His speech is normal and behavior is normal. Judgment and thought content normal. Cognition and memory are normal.  Vitals reviewed.      Assessment:    Acute recurrent frontal sinusitis- last June 2019/ sees [facility]  Eustachian tube dysfunction, bilateral      Plan:     Meds ordered this encounter  Medications  . amoxicillin-clavulanate (AUGMENTIN) 875-125 MG tablet    Sig: Take 1 tablet by mouth 2 (two) times daily.    Dispense:  20 tablet    Refill:  0  . predniSONE (STERAPRED UNI-PAK 21 TAB) 10 MG (21) TBPK tablet    Sig: PO: Take 6 tablets on day 1:Take 5 tablets day 2:Take 4 tablets day 3: Take 3 tablets day 4:Take 2 tablets day five: 5 Take 1 tablet day 6    Dispense:  21 tablet    Refill:  0   Follow up with ENT with the recommendation he  was given by then to have tubes in ears within one month or sooner if any symptoms worsen.     Advised patient call the office or your primary care doctor for an appointment if no improvement within 72 hours or if any symptoms change or worsen at any time  Advised ER or urgent Care if after hours or on weekend. Call 911 for emergency symptoms at any time.Patinet verbalized understanding of all instructions given/reviewed and treatment plan and has no further questions or concerns at this time.    Patient verbalized understanding of all instructions given and denies any further questions at this time.

## 2018-02-04 NOTE — Patient Instructions (Signed)
Sinusitis, Adult Sinusitis is soreness and inflammation of your sinuses. Sinuses are hollow spaces in the bones around your face. They are located:  Around your eyes.  In the middle of your forehead.  Behind your nose.  In your cheekbones.  Your sinuses and nasal passages are lined with a stringy fluid (mucus). Mucus normally drains out of your sinuses. When your nasal tissues get inflamed or swollen, the mucus can get trapped or blocked so air cannot flow through your sinuses. This lets bacteria, viruses, and funguses grow, and that leads to infection. Follow these instructions at home: Medicines  Take, use, or apply over-the-counter and prescription medicines only as told by your doctor. These may include nasal sprays.  If you were prescribed an antibiotic medicine, take it as told by your doctor. Do not stop taking the antibiotic even if you start to feel better. Hydrate and Humidify  Drink enough water to keep your pee (urine) clear or pale yellow.  Use a cool mist humidifier to keep the humidity level in your home above 50%.  Breathe in steam for 10-15 minutes, 3-4 times a day or as told by your doctor. You can do this in the bathroom while a hot shower is running.  Try not to spend time in cool or dry air. Rest  Rest as much as possible.  Sleep with your head raised (elevated).  Make sure to get enough sleep each night. General instructions  Put a warm, moist washcloth on your face 3-4 times a day or as told by your doctor. This will help with discomfort.  Wash your hands often with soap and water. If there is no soap and water, use hand sanitizer.  Do not smoke. Avoid being around people who are smoking (secondhand smoke).  Keep all follow-up visits as told by your doctor. This is important. Contact a doctor if:  You have a fever.  Your symptoms get worse.  Your symptoms do not get better within 10 days. Get help right away if:  You have a very bad  headache.  You cannot stop throwing up (vomiting).  You have pain or swelling around your face or eyes.  You have trouble seeing.  You feel confused.  Your neck is stiff.  You have trouble breathing. This information is not intended to replace advice given to you by your health care provider. Make sure you discuss any questions you have with your health care provider. Document Released: 09/03/2007 Document Revised: 11/11/2015 Document Reviewed: 01/10/2015 Elsevier Interactive Patient Education  2018 [employer] Fluticasone nasal spray What is this medicine? FLUTICASONE (floo TIK a sone) is a corticosteroid. This medicine is used to treat the symptoms of allergies like sneezing, itchy red eyes, and itchy, runny, or stuffy nose. This medicine is also used to treat nasal polyps. This medicine may be used for other purposes; ask your health care provider or pharmacist if you have questions. COMMON BRAND NAME(S): Flonase, Flonase Allergy Relief, Flonase Sensimist, Veramyst, XHANCE What should I tell my health care provider before I take this medicine? They need to know if you have any of these conditions: -cataracts -glaucoma -infection, like tuberculosis, herpes, or fungal infection -recent surgery on nose or sinuses -taking a corticosteroid by mouth -an unusual or allergic reaction to fluticasone, steroids, other medicines, foods, dyes, or preservatives -pregnant or trying to get pregnant -breast-feeding How should I use this medicine? This medicine is for use in the nose. Follow the directions on your product or prescription label.  This medicine works best if used at regular intervals. Do not use more often than directed. Make sure that you are using your nasal spray correctly. After 6 months of daily use for allergies, talk to your doctor or health care professional before using it for a longer time. Ask your doctor or health care professional if you have any questions. Talk to  your pediatrician regarding the use of this medicine in children. Special care may be needed. Some products have been used for allergies in children as young as 2 years. After 2 months of daily use without a prescription in a child, talk to your pediatrician before using it for a longer time. Use of this medicine for nasal polyps is not approved in children. Overdosage: If you think you have taken too much of this medicine contact a poison control center or emergency room at once. NOTE: This medicine is only for you. Do not share this medicine with others. What if I miss a dose? If you miss a dose, use it as soon as you remember. If it is almost time for your next dose, use only that dose and continue with your regular schedule. Do not use double or extra doses. What may interact with this medicine? -certain antibiotics like clarithromycin and telithromycin -certain medicines for fungal infections like ketoconazole, itraconazole, and voriconazole -conivaptan -nefazodone -some medicines for HIV -vaccines This list may not describe all possible interactions. Give your health care provider a list of all the medicines, herbs, non-prescription drugs, or dietary supplements you use. Also tell them if you smoke, drink alcohol, or use illegal drugs. Some items may interact with your medicine. What should I watch for while using this medicine? Visit your doctor or health care professional for regular checks on your progress. Some symptoms may improve within 12 hours after starting use. Check with your doctor or health care professional if there is no improvement in your symptoms after 3 weeks of use. This medicine may increase your risk of getting an infection. Tell your doctor or health care professional if you are around anyone with measles or chickenpox, or if you develop sores or blisters that do not heal properly. What side effects may I notice from receiving this medicine? Side effects that you should  report to your doctor or health care professional as soon as possible: -allergic reactions like skin rash, itching or hives, swelling of the face, lips, or tongue -changes in vision -crusting or sores in the nose -nosebleed -signs and symptoms of infection like fever or chills; cough; sore throat -white patches or sores in the mouth or nose Side effects that usually do not require medical attention (report to your doctor or health care professional if they continue or are bothersome): -burning or irritation inside the nose or throat -cough -headache -unusual taste or smell This list may not describe all possible side effects. Call your doctor for medical advice about side effects. You may report side effects to FDA at 1-800-FDA-1088. Where should I keep my medicine? Keep out of the reach of children. Store at room temperature between 15 and 30 degrees C (59 and 86 degrees F). Avoid exposure to extreme heat, cold, or light. Throw away any unused medicine after the expiration date. NOTE: This sheet is a summary. It may not cover all possible information. If you have questions about this medicine, talk to your doctor, pharmacist, or health care provider.  2018 Elsevier/Gold Standard (2015-12-28 14:23:12) Amoxicillin; Clavulanic Acid tablets What is this medicine?  AMOXICILLIN; CLAVULANIC ACID (a mox i SIL in; KLAV yoo lan ic AS id) is a penicillin antibiotic. It is used to treat certain kinds of bacterial infections. It will not work for colds, flu, or other viral infections. This medicine may be used for other purposes; ask your health care provider or pharmacist if you have questions. COMMON BRAND NAME(S): Augmentin What should I tell my health care provider before I take this medicine? They need to know if you have any of these conditions: -bowel disease, like colitis -kidney disease -liver disease -mononucleosis -an unusual or allergic reaction to amoxicillin, penicillin, cephalosporin,  other antibiotics, clavulanic acid, other medicines, foods, dyes, or preservatives -pregnant or trying to get pregnant -breast-feeding How should I use this medicine? Take this medicine by mouth with a full glass of water. Follow the directions on the prescription label. Take at the start of a meal. Do not crush or chew. If the tablet has a score line, you may cut it in half at the score line for easier swallowing. Take your medicine at regular intervals. Do not take your medicine more often than directed. Take all of your medicine as directed even if you think you are better. Do not skip doses or stop your medicine early. Talk to your pediatrician regarding the use of this medicine in children. Special care may be needed. Overdosage: If you think you have taken too much of this medicine contact a poison control center or emergency room at once. NOTE: This medicine is only for you. Do not share this medicine with others. What if I miss a dose? If you miss a dose, take it as soon as you can. If it is almost time for your next dose, take only that dose. Do not take double or extra doses. What may interact with this medicine? -allopurinol -anticoagulants -birth control pills -methotrexate -probenecid This list may not describe all possible interactions. Give your health care provider a list of all the medicines, herbs, non-prescription drugs, or dietary supplements you use. Also tell them if you smoke, drink alcohol, or use illegal drugs. Some items may interact with your medicine. What should I watch for while using this medicine? Tell your doctor or health care professional if your symptoms do not improve. Do not treat diarrhea with over the counter products. Contact your doctor if you have diarrhea that lasts more than 2 days or if it is severe and watery. If you have diabetes, you may get a false-positive result for sugar in your urine. Check with your doctor or health care professional. Birth  control pills may not work properly while you are taking this medicine. Talk to your doctor about using an extra method of birth control. What side effects may I notice from receiving this medicine? Side effects that you should report to your doctor or health care professional as soon as possible: -allergic reactions like skin rash, itching or hives, swelling of the face, lips, or tongue -breathing problems -dark urine -fever or chills, sore throat -redness, blistering, peeling or loosening of the skin, including inside the mouth -seizures -trouble passing urine or change in the amount of urine -unusual bleeding, bruising -unusually weak or tired -white patches or sores in the mouth or throat Side effects that usually do not require medical attention (report to your doctor or health care professional if they continue or are bothersome): -diarrhea -dizziness -headache -nausea, vomiting -stomach upset -vaginal or anal irritation This list may not describe all possible side effects. Call  your doctor for medical advice about side effects. You may report side effects to FDA at 1-800-FDA-1088. Where should I keep my medicine? Keep out of the reach of children. Store at room temperature below 25 degrees C (77 degrees F). Keep container tightly closed. Throw away any unused medicine after the expiration date. NOTE: This sheet is a summary. It may not cover all possible information. If you have questions about this medicine, talk to your doctor, pharmacist, or health care provider.  2018 Elsevier/Gold Standard (2007-06-10 12:04:30)

## 2018-06-07 ENCOUNTER — Encounter: Payer: Self-pay | Admitting: Medical

## 2018-06-07 ENCOUNTER — Other Ambulatory Visit: Payer: Self-pay

## 2018-06-07 ENCOUNTER — Ambulatory Visit: Payer: Self-pay | Admitting: Medical

## 2018-06-07 VITALS — BP 156/72 | HR 93 | Temp 97.8°F | Resp 18 | Wt 227.8 lb

## 2018-06-07 DIAGNOSIS — H6502 Acute serous otitis media, left ear: Secondary | ICD-10-CM

## 2018-06-07 DIAGNOSIS — H6981 Other specified disorders of Eustachian tube, right ear: Secondary | ICD-10-CM

## 2018-06-07 DIAGNOSIS — J0110 Acute frontal sinusitis, unspecified: Secondary | ICD-10-CM

## 2018-06-07 MED ORDER — AMOXICILLIN-POT CLAVULANATE 875-125 MG PO TABS: 1.0000 | ORAL_TABLET | Freq: Two times a day (BID) | ORAL | 0 refills | Status: DC

## 2018-06-07 NOTE — Progress Notes (Signed)
Subjective:    Patient ID: Jose Edwards, male    DOB: 1955/04/22, 63 y.o.   MRN: [id number]  HPI 63 yo male in non acute distress. Started with Sinus congestion and fullness in ears x 2-3 weeks.Friday with chest tightness and coughing green , continued thorough even today.   Blood pressure (!) 156/72, pulse 93, temperature 97.8 F (36.6 C), temperature source Tympanic, resp. rate 18, weight 227 lb 12.8 oz (103.3 kg), SpO2 99 %. Allergies  Allergen Reactions  . Gabapentin Other (See Comments)  . Pseudoephedrine     REACTION: makes drowsy     Wife had double  masectomy  Mar 10, 2018.   Review of Systems  Constitutional: Negative for chills, fatigue and fever.  HENT: Positive for congestion, ear pain, nosebleeds (aftr blowing nose alot. left side bled), postnasal drip (little bit), sinus pressure, sinus pain (forehead), sore throat and voice change (hoarse). Negative for ear discharge, hearing loss, rhinorrhea and sneezing.   Eyes: Negative for discharge and itching.  Respiratory: Positive for cough. Negative for shortness of breath.   Cardiovascular: Negative for chest pain, palpitations and leg swelling.  Gastrointestinal: Negative for abdominal pain, diarrhea, nausea and vomiting.  Genitourinary: Negative for difficulty urinating and dysuria.  Musculoskeletal: Negative for myalgias.  Skin: Negative for rash.  Allergic/Immunologic: Positive for environmental allergies. Negative for food allergies.  Neurological: Positive for dizziness (now resolved but had to get crystals reaaligned.). Negative for syncope and light-headedness.       Objective:   Physical Exam Vitals signs and nursing note reviewed.  Constitutional:      Appearance: Normal appearance.  HENT:     Head: Normocephalic and atraumatic.     Right Ear: Ear canal and external ear normal.     Left Ear: Ear canal and external ear normal.     Nose: Congestion present.     Mouth/Throat:     Mouth: Mucous  membranes are moist.     Pharynx: Oropharynx is clear.  Eyes:     Extraocular Movements: Extraocular movements intact.     Conjunctiva/sclera: Conjunctivae normal.     Pupils: Pupils are equal, round, and reactive to light.  Neck:     Musculoskeletal: Normal range of motion.  Cardiovascular:     Rate and Rhythm: Normal rate and regular rhythm.     Pulses: Normal pulses.     Heart sounds: Normal heart sounds.  Pulmonary:     Effort: Pulmonary effort is normal.     Breath sounds: Normal breath sounds.  Musculoskeletal: Normal range of motion.  Skin:    General: Skin is warm and dry.     Capillary Refill: Capillary refill takes less than 2 seconds.  Neurological:     General: No focal deficit present.     Mental Status: He is alert and oriented to person, place, and time.  Psychiatric:        Mood and Affect: Mood normal.        Behavior: Behavior normal.        Thought Content: Thought content normal.        Judgment: Judgment normal.           Assessment & Plan:  Sinusitis Otitis media Left Eustachian Tube Dysfunction right Meds ordered this encounter  Medications  . amoxicillin-clavulanate (AUGMENTIN) 875-125 MG tablet    Sig: Take 1 tablet by mouth 2 (two) times daily.    Dispense:  20 tablet    Refill:  0  Take OTC Motrin or Tylenol per package instructions for fever or pain. RTC if not improving in 3-5 days or follow up with your PCP.  Patient verbalizes understanding and has no questions at discharge.

## 2018-11-28 HISTORY — PX: TRANSTHORACIC ECHOCARDIOGRAM: SHX275

## 2019-01-11 ENCOUNTER — Ambulatory Visit: Payer: Self-pay

## 2019-01-11 ENCOUNTER — Other Ambulatory Visit: Payer: Self-pay

## 2019-01-11 DIAGNOSIS — Z23 Encounter for immunization: Secondary | ICD-10-CM

## 2019-01-19 ENCOUNTER — Emergency Department (HOSPITAL_COMMUNITY): Payer: BC Managed Care – PPO

## 2019-01-19 ENCOUNTER — Encounter (HOSPITAL_COMMUNITY): Payer: Self-pay | Admitting: Emergency Medicine

## 2019-01-19 ENCOUNTER — Emergency Department (HOSPITAL_COMMUNITY)
Admission: EM | Admit: 2019-01-19 | Discharge: 2019-01-20 | Disposition: A | Discharge status: Home / Self Care | Payer: BC Managed Care – PPO | Attending: Emergency Medicine | Admitting: Emergency Medicine

## 2019-01-19 ENCOUNTER — Other Ambulatory Visit: Payer: Self-pay

## 2019-01-19 ENCOUNTER — Telehealth: Payer: Self-pay | Admitting: Medical

## 2019-01-19 DIAGNOSIS — Z79899 Other long term (current) drug therapy: Secondary | ICD-10-CM | POA: Insufficient documentation

## 2019-01-19 DIAGNOSIS — R0789 Other chest pain: Secondary | ICD-10-CM | POA: Diagnosis not present

## 2019-01-19 DIAGNOSIS — R079 Chest pain, unspecified: Secondary | ICD-10-CM | POA: Diagnosis not present

## 2019-01-19 LAB — BASIC METABOLIC PANEL
Anion gap: 11 (ref 5–15)
BUN: 8 mg/dL (ref 8–23)
CO2: 22 mmol/L (ref 22–32)
Calcium: 9.2 mg/dL (ref 8.9–10.3)
Chloride: 107 mmol/L (ref 98–111)
Creatinine, Ser: 0.94 mg/dL (ref 0.61–1.24)
GFR calc Af Amer: >60 mL/min (ref >60)
GFR calc non Af Amer: >60 mL/min (ref >60)
Glucose, Bld: 110 mg/dL — ABNORMAL HIGH (ref 70–99)
Potassium: 4 mmol/L (ref 3.5–5.1)
Sodium: 140 mmol/L (ref 135–145)

## 2019-01-19 LAB — CBC
HCT: 42.3 % (ref 39.0–52.0)
Hemoglobin: 14.5 g/dL (ref 13.0–17.0)
MCH: 31.1 pg (ref 26.0–34.0)
MCHC: 34.3 g/dL (ref 30.0–36.0)
MCV: 90.8 fL (ref 80.0–100.0)
Platelets: 246 10*3/uL (ref 150–400)
RBC: 4.66 MIL/uL (ref 4.22–5.81)
RDW: 12.5 % (ref 11.5–15.5)
WBC: 4.8 10*3/uL (ref 4.0–10.5)
nRBC: 0 % (ref 0.0–0.2)

## 2019-01-19 LAB — TROPONIN I (HIGH SENSITIVITY)
Troponin I (High Sensitivity): 3 ng/L (ref <18)
Troponin I (High Sensitivity): 3 ng/L (ref <18)

## 2019-01-19 MED ORDER — SODIUM CHLORIDE 0.9% FLUSH: 3.0000 mL | Freq: Once | INTRAVENOUS | Status: DC

## 2019-01-19 MED ORDER — ASPIRIN 81 MG PO CHEW
324.0000 mg | CHEWABLE_TABLET | Freq: Once | ORAL | Status: AC
  Administered 2019-01-19: 324 mg via ORAL
  Filled 2019-01-19: qty 4

## 2019-01-19 NOTE — ED Triage Notes (Signed)
Pt endorses left sided sharp pain intermittently for a week. Endorses back pain. Worse with movement like a muscle cramp.

## 2019-01-19 NOTE — Telephone Encounter (Signed)
63 yo male calls about left sided chest pain, starting the beginning of last week initially on / off but now more constant yesterday. He feels it has gotten worse every day and it now radiates around to his back from the left chest area to 3/4 around his back on the left side. He gets the pain if he is at rest or if he is active. Though it did seem to ease off after sitting down a while. He used some muscle rub like  BenGay to his back to see it would ease off. He states it did help, but this may be because of the pattern being on/off). He recalls no injury or pulling any muscles. He denies shortness of breath, N/V or diaphoresis. Denies cough or fever or chills.   He does not smoke and he does not have a history of hypertension.   His maternal grandfather (late 78's) went to have a bypass surgery and he thinks they found out his valve was deformed and so they replaced that and he live into his 52's.   I discussed with the patient that I was concerned that it could be a cardiac issue and that needs to be ruled out. He says he was told by his wife's cardiologist that he could call him at anytime for a heart check.  I recommend he try him first, if unable to get in he could go to [facility] who does  EKG's or he could go to the Emergency Department for EKG and Troponin test.  He also may reach out to his  PCP if needed.   I asked that he call later and let me know where he decided to go for evaluation and treatment.  Today was a Flu Clinic and we were not seeing patients in the clinic today. I reviewed with the patient that he should not delay evaluation. He verbalizes understanding and had no questions at the end of our conversation.

## 2019-01-19 NOTE — ED Provider Notes (Signed)
[facility] EMERGENCY DEPARTMENT Provider Note   CSN: [id number] Arrival date & time: 01/19/19  1413     History   Chief Complaint Chief Complaint  Patient presents with  . Chest Pain    HPI Jose Edwards is a 63 y.o. male.     HPI  Pt is a 63 y/o male with a h/o GERD who presents to the ED today for eval of chest pain. Reports pain to the left side of the chest, upper abdomen. Pain radiates around to the left side of his back. States pain has been ongoing intermittently for the last week. He describes pain as sharp. He used some muscle cream on his back which seemed to help last night. This morning pain started back again while he was at work. States his symptoms do not seem to be associated with exertion. Sometimes the symptoms are worse with movement of his left arm and certain positions.  He states pain feels like it is in his muscle or in his rib. He reports some associated shortness of breath when he has the pain which happens intermittently with the pain. Denies pleuritic pain, cough, hemoptysis. Denies fevers, abd pain, NVD, dysuria, frequency, or urgency.   Has chronic BLE asymmetric swelling that is unchanged. No significant BLE pain. Denies recent surgery/trauma, recent long travel, hormone use, personal hx of cancer, or hx of DVT/PE.  Denies h/o HTN, HLD, DM, or tobacco use. He reports that his only known family member with heart disease was his grandfather and he had a congenital abnormality.   Past Medical History:  Diagnosis Date  . Benign positional vertigo   . ED (erectile dysfunction)   . GERD (gastroesophageal reflux disease)   . Osteoarthrosis, unspecified whether generalized or localized, unspecified site     Patient Active Problem List   Diagnosis Date Noted  . Derangement of knee 09/24/2017  . Meralgia paresthetica of both lower extremities 09/12/2016  . Bilateral carpal tunnel syndrome 09/12/2016  . Sleep disturbance  05/21/2016  . Routine general medical examination at a health care facility 06/22/2012  . Obesity (BMI 30-39.9) 06/22/2012  . ED (erectile dysfunction)   . GERD 02/27/2010  . Osteoarthritis, multiple sites 02/27/2010    Past Surgical History:  Procedure Laterality Date  . ANKLE SURGERY  2008   fracture left ankle ( 2 pins)  . ROTATOR CUFF REPAIR  2005   right shoulder      Home Medications    Prior to Admission medications   Medication Sig Start Date End Date Taking? Authorizing Provider  amoxicillin-clavulanate (AUGMENTIN) 875-125 MG tablet Take 1 tablet by mouth 2 (two) times daily. 06/07/18   Ratcliffe, Heather R, PA-C  cholecalciferol (VITAMIN D) 1000 UNITS tablet Take 2,000 Units by mouth 2 (two) times a week.     [provider]  fluticasone (FLONASE) 50 MCG/ACT nasal spray Place 2 sprays into both nostrils daily. Patient not taking: Reported on 06/07/2018 09/24/17   Flinchum, Eula Fried, FNP  methocarbamol (ROBAXIN) 750 MG tablet Take 1 tablet (750 mg total) by mouth at bedtime as needed for up to 5 days for muscle spasms. 01/20/19 01/25/19  Abhay Godbolt S, PA-C  omeprazole (PRILOSEC) 20 MG capsule Take 20 mg by mouth daily.    [provider]  sodium chloride (OCEAN) 0.65 % SOLN nasal spray Place 1 spray into both nostrils as needed for congestion. Patient not taking: Reported on 06/07/2018 09/24/17   Flinchum, Eula Fried, FNP  traZODone (DESYREL)  50 MG tablet TAKE 1/2 TO 1 (ONE-HALF TO ONE) TABLET BY MOUTH AT BEDTIME AS NEEDED FOR SLEEP 08/05/16   Venia Carbon, MD  vitamin B-12 (CYANOCOBALAMIN) 100 MCG tablet Take 100 mcg by mouth every other day.     [provider]    Family History Family History  Problem Relation Age of Onset  . Cancer Mother   . Diabetes Sister   . Diabetes Paternal Grandmother   . Cancer Father   . Coronary artery disease Neg Hx   . Hypertension Neg Hx     Social History Social History   Tobacco Use  . Smoking  status: Never Smoker  . Smokeless tobacco: Never Used  Substance Use Topics  . Alcohol use: Yes    Comment: Beer socially (once weekly)  . Drug use: No     Allergies   Gabapentin and Pseudoephedrine   Review of Systems Review of Systems  Constitutional: Negative for fever.  HENT: Negative for ear pain and sore throat.   Eyes: Negative for visual disturbance.  Respiratory: Positive for shortness of breath. Negative for cough.   Cardiovascular: Positive for chest pain.  Gastrointestinal: Negative for abdominal pain, constipation, diarrhea, nausea and vomiting.  Genitourinary: Positive for flank pain. Negative for dysuria and hematuria.  Musculoskeletal: Positive for back pain.  Skin: Negative for color change and rash.  Neurological: Negative for headaches.  All other systems reviewed and are negative.    Physical Exam Updated Vital Signs BP (!) 154/92 (BP Location: Left Arm)   Pulse 78   Temp 97.8 F (36.6 C) (Oral)   Resp 20   Ht 5\' 7"  (1.702 m)   Wt 98.9 kg   SpO2 97%   BMI 34.14 kg/m   Physical Exam Vitals signs and nursing note reviewed.  Constitutional:      General: He is not in acute distress.    Appearance: He is well-developed. He is not ill-appearing or toxic-appearing.  HENT:     Head: Normocephalic and atraumatic.  Eyes:     Conjunctiva/sclera: Conjunctivae normal.  Neck:     Musculoskeletal: Neck supple.  Cardiovascular:     Rate and Rhythm: Normal rate and regular rhythm.     Pulses:          Dorsalis pedis pulses are 2+ on the right side and 2+ on the left side.     Heart sounds: Normal heart sounds. No murmur.  Pulmonary:     Effort: Pulmonary effort is normal. No respiratory distress.     Breath sounds: Normal breath sounds. No decreased breath sounds, wheezing, rhonchi or rales.  Chest:     Chest wall: Tenderness (along left posterior ribs) present.  Abdominal:     General: Bowel sounds are normal.     Palpations: Abdomen is soft.      Tenderness: There is no abdominal tenderness. There is no right CVA tenderness, left CVA tenderness, guarding or rebound.  Musculoskeletal:     Right lower leg: He exhibits no tenderness. No edema.     Left lower leg: He exhibits no tenderness. Edema (trace (chronic, unchanged)) present.  Skin:    General: Skin is warm and dry.  Neurological:     Mental Status: He is alert.      ED Treatments / Results  Labs (all labs ordered are listed, but only abnormal results are displayed) Labs Reviewed  BASIC METABOLIC PANEL - Abnormal; Notable for the following components:      Result Value  Glucose, Bld 110 (*)    All other components within normal limits  CBC  D-DIMER, QUANTITATIVE (NOT AT [facility])  TROPONIN I (HIGH SENSITIVITY)  TROPONIN I (HIGH SENSITIVITY)    EKG EKG Interpretation  Date/Time:  Wednesday January 19 2019 14:21:18 EDT Ventricular Rate:  98 PR Interval:  150 QRS Duration: 76 QT Interval:  330 QTC Calculation: 421 R Axis:   13 Text Interpretation:  Normal sinus rhythm with sinus arrhythmia Normal ECG No acute changes No significant change since last tracing Confirmed by Derwood Kaplananavati, Ankit [phone]([id number]) on 01/19/2019 9:49:25 PM   Radiology Dg Chest 2 View  Result Date: 01/19/2019 CLINICAL DATA:  Chest pain EXAM: CHEST - 2 VIEW COMPARISON:  None. FINDINGS: Lungs are clear. Heart size and pulmonary vascularity are normal. No adenopathy. There is mild degenerative change in the thoracic spine. No pneumothorax. IMPRESSION: No edema or consolidation. Electronically Signed   By: Bretta BangWilliam  Woodruff III M.D.   On: 01/19/2019 15:06    Procedures Procedures (including critical care time)  Medications Ordered in ED Medications  sodium chloride flush (NS) 0.9 % injection 3 mL (3 mLs Intravenous Not Given 01/19/19 2355)  aspirin chewable tablet 324 mg (324 mg Oral Given 01/19/19 2325)     Initial Impression / Assessment and Plan / ED Course  I have reviewed the triage vital  signs and the nursing notes.  Pertinent labs & imaging results that were available during my care of the patient were reviewed by me and considered in my medical decision making (see chart for details).   Final Clinical Impressions(s) / ED Diagnoses   Final diagnoses:  Atypical chest pain   63 y/o male presenting with intermittent chest pain over the last week that feels sharp in nature. Pain radiates to he left side of his back.   Reviewed labs CBC w/o anemia or leukocytosis BMP with normal electrolytes and kidney function Trop negative x2 Ddimer is also negative making PE less likely   EKG with Normal sinus rhythm with sinus arrhythmia Normal ECG No acute changes No significant change since last tracing  CXR w/o PNA, PTX, or other acute abnormality.   Pts sxs seem atypical for ACS. HEART score is 2 (age, obesity). Ddimer is negative making PE very unlikely. Doubt dissection, esophageal rupture or other emergent cause that would require admission to the hospital. I think that his sxs are likely related to muscular cause given he has some tenderness on exam and pain is worse with movement. Pt voiced interest for wanting to f/u with cardiology, I will give referral to f/u for possible stress. Advised to also f/u with pcp and to return if worse. He and his wife at bedside voiced understanding of the plan and reasons to return. All questions answered, pt stable for discharge.    ED Discharge Orders         Ordered    methocarbamol (ROBAXIN) 750 MG tablet  At bedtime PRN     01/20/19 0057           Karrie MeresCouture, Bethan Adamek S, PA-C 01/20/19 0059    Derwood KaplanNanavati, Ankit, MD 01/21/19 [phone]

## 2019-01-20 ENCOUNTER — Other Ambulatory Visit: Payer: Self-pay

## 2019-01-20 LAB — D-DIMER, QUANTITATIVE: D-Dimer, Quant: 0.32 ug/mL-FEU (ref 0.00–0.50)

## 2019-01-20 MED ORDER — CYCLOBENZAPRINE HCL 10 MG PO TABS
5.0000 mg | ORAL_TABLET | Freq: Once | ORAL | Status: AC
  Administered 2019-01-20: 5 mg via ORAL
  Filled 2019-01-20: qty 1

## 2019-01-20 MED ORDER — METHOCARBAMOL 750 MG PO TABS: 750.0000 mg | ORAL_TABLET | Freq: Every evening | ORAL | 0 refills | Status: AC | PRN

## 2019-01-20 NOTE — Discharge Instructions (Addendum)
You may alternate taking Tylenol and Ibuprofen as needed for pain control. You may take 400-600 mg of ibuprofen every 6 hours and [phone] mg of Tylenol every 6 hours. Do not exceed 4000 mg of Tylenol daily as this can lead to liver damage. Also, make sure to take Ibuprofen with meals as it can cause an upset stomach. Do not take other NSAIDs while taking Ibuprofen such as (Aleve, Naprosyn, Aspirin, Celebrex, etc) and do not take more than the prescribed dose as this can lead to ulcers and bleeding in your GI tract. You may use warm and cold compresses to help with your symptoms.   You were given a prescription for Robaxin which is a muscle relaxer.  You should not drive, work, or operate machinery while taking this medication as it can make you very drowsy.    Please follow up with your primary doctor within the next 7-10 days for re-evaluation and further treatment of your symptoms. You were also given information to follow up with cardiology. You may call the office to make an appointment for follow up.   Please return to the ER sooner if you have any new or worsening symptoms.

## 2019-01-20 NOTE — ED Notes (Signed)
Pt verbalized understanding of d/c instructions, scripts, follow up care and s/s requiring return to ED. Pt had no further questions at this time. 

## 2019-01-21 ENCOUNTER — Ambulatory Visit: Payer: BC Managed Care – PPO | Admitting: Internal Medicine

## 2019-01-21 ENCOUNTER — Telehealth: Payer: Self-pay

## 2019-01-21 ENCOUNTER — Encounter: Payer: Self-pay | Admitting: Internal Medicine

## 2019-01-21 ENCOUNTER — Other Ambulatory Visit: Payer: Self-pay

## 2019-01-21 VITALS — BP 124/80 | HR 90 | Temp 97.9°F | Ht 68.0 in | Wt 225.0 lb

## 2019-01-21 DIAGNOSIS — S29011D Strain of muscle and tendon of front wall of thorax, subsequent encounter: Secondary | ICD-10-CM | POA: Diagnosis not present

## 2019-01-21 MED ORDER — CYCLOBENZAPRINE HCL 5 MG PO TABS: 5.0000 mg | ORAL_TABLET | Freq: Every evening | ORAL | 0 refills | Status: DC | PRN

## 2019-01-21 NOTE — Telephone Encounter (Signed)
Will discuss at upcoming appt.

## 2019-01-21 NOTE — Telephone Encounter (Signed)
Pt said he was seen on 01/19/19 [facility] ED with CP; now pt having CP on lt side of chest that radiates to lt back. Pain level now is 2-3; pain mostly in back; pts BP now 157/97 P 86 but pt said he is having the lt back pain and feels tense. Pt has had SOB upon exertion and last SOB was when had the sharp pain in chest and back before going to ED on 01/19/19. Now pt can associate the chest and back pain more with movement. Pt had cardiac testing at Ed that was all OK. Pt was given methocarbamol 750 mg and took one last night and pt had a terrible H/A that went away and pt slept all night. Pt has no covid symptoms, no travel and no known exposure to + covid. Pt scheduled appt with Avie Echevaria NP today at 3:45; pt will be at office at 3:30 for ck in. ED precautions given and pt voiced understanding.

## 2019-01-21 NOTE — Progress Notes (Signed)
Subjective:    Patient ID: Jose Edwards, male    DOB: 1955-06-27, 63 y.o.   MRN: [id number]  HPI  Pt presents to the clinic today with c/o for ER follow up for chest pain. He reports the pain is on the eft side of his chest and radiates around to the back. He describes the pain as sharp, worse with movement. His pain is not worse with exertion.He reports intermittent SOB when pain is severe. ECG was normal. Labs, including troponin normal. Chest xray negative. They felt like this was muscular and he was given Methocarbamol and advised to follow up with PCP. Since discharge, he reports the pain has improved but not resolved. He does not feel like the Methocarbamol is as effective as the Flexeril they gave him in the ER. He does have a history of GERD but reports this feel diffrent.  Review of Systems  Past Medical History:  Diagnosis Date  . Benign positional vertigo   . ED (erectile dysfunction)   . GERD (gastroesophageal reflux disease)   . Osteoarthrosis, unspecified whether generalized or localized, unspecified site     Current Outpatient Medications  Medication Sig Dispense Refill  . cholecalciferol (VITAMIN D) 1000 UNITS tablet Take 2,000 Units by mouth 2 (two) times a week.     . fluticasone (FLONASE) 50 MCG/ACT nasal spray Place 2 sprays into both nostrils daily. 16 g 2  . methocarbamol (ROBAXIN) 750 MG tablet Take 1 tablet (750 mg total) by mouth at bedtime as needed for up to 5 days for muscle spasms. 5 tablet 0  . omeprazole (PRILOSEC) 20 MG capsule Take 20 mg by mouth daily.    . sodium chloride (OCEAN) 0.65 % SOLN nasal spray Place 1 spray into both nostrils as needed for congestion. 1 Bottle 4  . traZODone (DESYREL) 50 MG tablet TAKE 1/2 TO 1 (ONE-HALF TO ONE) TABLET BY MOUTH AT BEDTIME AS NEEDED FOR SLEEP 30 tablet 10  . vitamin B-12 (CYANOCOBALAMIN) 100 MCG tablet Take 100 mcg by mouth every other day.      No current facility-administered medications for this  visit.     Allergies  Allergen Reactions  . Gabapentin Other (See Comments)  . Pseudoephedrine     REACTION: makes drowsy    Family History  Problem Relation Age of Onset  . Cancer Mother   . Diabetes Sister   . Diabetes Paternal Grandmother   . Cancer Father   . Coronary artery disease Neg Hx   . Hypertension Neg Hx     Social History   Socioeconomic History  . Marital status: Married    Spouse name: Not on file  . Number of children: 3  . Years of education: 26  . Highest education level: Not on file  Occupational History  . Occupation: Research scientist (physical sciences): [employer]  . Financial resource strain: Not on file  . Food insecurity    Worry: Not on file    Inability: Not on file  . Transportation needs    Medical: Not on file    Non-medical: Not on file  Tobacco Use  . Smoking status: Never Smoker  . Smokeless tobacco: Never Used  Substance and Sexual Activity  . Alcohol use: Yes    Comment: Beer socially (once weekly)  . Drug use: No  . Sexual activity: Not on file  Lifestyle  . Physical activity    Days per week: Not on file  Minutes per session: Not on file  . Stress: Not on file  Relationships  . Social Musicianconnections    Talks on phone: Not on file    Gets together: Not on file    Attends religious service: Not on file    Active member of club or organization: Not on file    Attends meetings of clubs or organizations: Not on file    Relationship status: Not on file  . Intimate partner violence    Fear of current or ex partner: Not on file    Emotionally abused: Not on file    Physically abused: Not on file    Forced sexual activity: Not on file  Other Topics Concern  . Not on file  Social History Narrative   Lives with wife in a one story home.  Has 3 sons.  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  Education: high school.     Constitutional: Denies fever, malaise, fatigue, headache or abrupt weight changes.   Respiratory: Pt reports intermittent SOB- due to pain. Denies difficulty breathing,  cough or sputum production.   Cardiovascular: Denies chest pain, chest tightness, palpitations or swelling in the hands or feet.  Gastrointestinal: Denies abdominal pain, bloating, constipation, diarrhea or blood in the stool.  Musculoskeletal: Pt reports left chest wall pain. Denies decrease in range of motion, difficulty with gait, or joint pain and swelling.  Skin: Denies redness, rashes, lesions or ulcercations.  Psych: Denies anxiety, depression, SI/HI.  No other specific complaints in a complete review of systems (except as listed in HPI above).     Objective:   Physical Exam   BP 124/80 (BP Location: Left Arm, Patient Position: Sitting, Cuff Size: Large)   Pulse 90   Temp 97.9 F (36.6 C)   Ht 5\' 8"  (1.727 m)   Wt 225 lb (102.1 kg)   SpO2 97%   BMI 34.21 kg/m  Wt Readings from Last 3 Encounters:  01/21/19 225 lb (102.1 kg)  01/19/19 218 lb (98.9 kg)  06/07/18 227 lb 12.8 oz (103.3 kg)    General: Appears his stated age, obese, in NAD. Skin: Warm, dry and intact. No rashes, lesions or ulcerations noted. Cardiovascular: Normal rate and rhythm.  Pulmonary/Chest: Normal effort and positive vesicular breath sounds. No respiratory distress. No wheezes, rales or ronchi noted.  Abdomen: Soft and nontender. Normal bowel sounds. No distention or masses noted.  Musculoskeletal: Left upper chest wall tender to palpation. Strength 5/5 BUE. Neurological: Alert and oriented  BMET    Component Value Date/Time   NA 140 01/19/2019 1424   K 4.0 01/19/2019 1424   CL 107 01/19/2019 1424   CO2 22 01/19/2019 1424   GLUCOSE 110 (H) 01/19/2019 1424   BUN 8 01/19/2019 1424   CREATININE 0.94 01/19/2019 1424   CALCIUM 9.2 01/19/2019 1424   GFRNONAA >60 01/19/2019 1424   GFRAA >60 01/19/2019 1424    Lipid Panel     Component Value Date/Time   CHOL 200 05/21/2016 1309   TRIG 88.0 05/21/2016 1309    HDL 39.40 05/21/2016 1309   CHOLHDL 5 05/21/2016 1309   VLDL 17.6 05/21/2016 1309   LDLCALC 143 (H) 05/21/2016 1309    CBC    Component Value Date/Time   WBC 4.8 01/19/2019 1424   RBC 4.66 01/19/2019 1424   HGB 14.5 01/19/2019 1424   HCT 42.3 01/19/2019 1424   PLT 246 01/19/2019 1424   MCV 90.8 01/19/2019 1424   MCH 31.1 01/19/2019 1424  MCHC 34.3 01/19/2019 1424   RDW 12.5 01/19/2019 1424   LYMPHSABS 1.1 05/21/2016 1309   MONOABS 0.4 05/21/2016 1309   EOSABS 0.1 05/21/2016 1309   BASOSABS 0.1 05/21/2016 1309    Hgb A1C No results found for: HGBA1C         Assessment & Plan:   ER Follow Up for Atypical Chest Pain:  ER notes, labs and imaging reviewed Do not feel like this is heart or GI related, likely muscular Ibuprofen 600 mg every 8 hours as needed with food Stop Methocarbamol if not helping Rx for Flexeril 5 mg nightly as needed-sedation caution given  Return precautions discussed Nicki Reaper, NP

## 2019-01-29 NOTE — Patient Instructions (Signed)

## 2019-02-04 ENCOUNTER — Telehealth: Payer: Self-pay | Admitting: Nurse Practitioner

## 2019-02-04 ENCOUNTER — Other Ambulatory Visit: Payer: Self-pay

## 2019-02-04 DIAGNOSIS — J0111 Acute recurrent frontal sinusitis: Secondary | ICD-10-CM

## 2019-02-04 MED ORDER — AMOXICILLIN-POT CLAVULANATE 875-125 MG PO TABS: 1.0000 | ORAL_TABLET | Freq: Two times a day (BID) | ORAL | 0 refills | Status: DC

## 2019-02-04 NOTE — Patient Instructions (Addendum)
Fluids and rest Start Augmentin and take as directed with food Change Flonase to 1 spray twice daily and use nasal saline prior Continue Zyrtec and Benadryl as directed Encouraged patient to call the office or primary care doctor for an appointment if no improvement in symptoms or if symptoms change or worsen after 72 hours of planned treatment. Patient verbalized understanding of all instructions given/reviewed and has no further questions or concerns at this time.     Sinusitis, Adult  Sinusitis is soreness and swelling (inflammation) of your sinuses. Sinuses are hollow spaces in the bones around your face. They are located:  Around your eyes.  In the middle of your forehead.  Behind your nose.  In your cheekbones. Your sinuses and nasal passages are lined with a fluid called mucus. Mucus drains out of your sinuses. Swelling can trap mucus in your sinuses. This lets germs (bacteria, virus, or fungus) grow, which leads to infection. Most of the time, this condition is caused by a virus. What are the causes? This condition is caused by:  Allergies.  Asthma.  Germs.  Things that block your nose or sinuses.  Growths in the nose (nasal polyps).  Chemicals or irritants in the air.  Fungus (rare). What increases the risk? You are more likely to develop this condition if:  You have a weak body defense system (immune system).  You do a lot of swimming or diving.  You use nasal sprays too much.  You smoke. What are the signs or symptoms? The main symptoms of this condition are pain and a feeling of pressure around the sinuses. Other symptoms include:  Stuffy nose (congestion).  Runny nose (drainage).  Swelling and warmth in the sinuses.  Headache.  Toothache.  A cough that may get worse at night.  Mucus that collects in the throat or the back of the nose (postnasal drip).  Being unable to smell and taste.  Being very tired (fatigue).  A fever.  Sore  throat.  Bad breath. How is this diagnosed? This condition is diagnosed based on:  Your symptoms.  Your medical history.  A physical exam.  Tests to find out if your condition is short-term (acute) or long-term (chronic). Your doctor may: ? Check your nose for growths (polyps). ? Check your sinuses using a tool that has a light (endoscope). ? Check for allergies or germs. ? Do imaging tests, such as an MRI or CT scan. How is this treated? Treatment for this condition depends on the cause and whether it is short-term or long-term.  If caused by a virus, your symptoms should go away on their own within 10 days. You may be given medicines to relieve symptoms. They include: ? Medicines that shrink swollen tissue in the nose. ? Medicines that treat allergies (antihistamines). ? A spray that treats swelling of the nostrils. ? Rinses that help get rid of thick mucus in your nose (nasal saline washes).  If caused by bacteria, your doctor may wait to see if you will get better without treatment. You may be given antibiotic medicine if you have: ? A very bad infection. ? A weak body defense system.  If caused by growths in the nose, you may need to have surgery. Follow these instructions at home: Medicines  Take, use, or apply over-the-counter and prescription medicines only as told by your doctor. These may include nasal sprays.  If you were prescribed an antibiotic medicine, take it as told by your doctor. Do not stop taking  the antibiotic even if you start to feel better. Hydrate and humidify    Drink enough water to keep your pee (urine) pale yellow.  Use a cool mist humidifier to keep the humidity level in your home above 50%.  Breathe in steam for 10-15 minutes, 3-4 times a day, or as told by your doctor. You can do this in the bathroom while a hot shower is running.  Try not to spend time in cool or dry air. Rest  Rest as much as you can.  Sleep with your head  raised (elevated).  Make sure you get enough sleep each night. General instructions    Put a warm, moist washcloth on your face 3-4 times a day, or as often as told by your doctor. This will help with discomfort.  Wash your hands often with soap and water. If there is no soap and water, use hand sanitizer.  Do not smoke. Avoid being around people who are smoking (secondhand smoke).  Keep all follow-up visits as told by your doctor. This is important. Contact a doctor if:  You have a fever.  Your symptoms get worse.  Your symptoms do not get better within 10 days. Get help right away if:  You have a very bad headache.  You cannot stop throwing up (vomiting).  You have very bad pain or swelling around your face or eyes.  You have trouble seeing.  You feel confused.  Your neck is stiff.  You have trouble breathing. Summary  Sinusitis is swelling of your sinuses. Sinuses are hollow spaces in the bones around your face.  This condition is caused by tissues in your nose that become inflamed or swollen. This traps germs. These can lead to infection.  If you were prescribed an antibiotic medicine, take it as told by your doctor. Do not stop taking it even if you start to feel better.  Keep all follow-up visits as told by your doctor. This is important. This information is not intended to replace advice given to you by your health care provider. Make sure you discuss any questions you have with your health care provider. Document Released: 09/03/2007 Document Revised: 08/17/2017 Document Reviewed: 08/17/2017 Elsevier Patient Education  2020 [employer].

## 2019-02-04 NOTE — Progress Notes (Signed)
   Subjective:    Patient ID: Jose Edwards, male    DOB: October 10, 1955, 63 y.o.   MRN: [id number]  HPI [name] is on a telephonic visit with verbal consent today and c/o sinus pain and pressure x 2 weeks. He was seen 05/2018 for similar complaints and did well with treatment. He denies fever, cough, sore throat, SOB, or any other respiratory symptoms except nasal congestion and sinus pressure/pain. He admits "it hurts right above eyes". He's been taking 2 spray Flonase daily, Zyrtec and Benadryl at HS with some relief but no resolution of symptoms.    Review of Systems  Constitutional: Negative for fatigue and fever.  HENT: Positive for sinus pressure and sinus pain. Negative for ear pain and sore throat.   Cardiovascular: Negative for chest pain.       Endorses visit last month for this but was found to be MSK and doing well with muscle relaxers (resolved)       Objective:   Physical Exam  Asked pt to palpate maxillary/frontal sinus cavities for tenderness and reports tenderness more to frontal sinus cavity.      Assessment & Plan:

## 2019-08-19 ENCOUNTER — Other Ambulatory Visit: Payer: Self-pay

## 2019-08-19 ENCOUNTER — Telehealth: Payer: Self-pay | Admitting: Nurse Practitioner

## 2019-08-19 NOTE — Progress Notes (Signed)
Patient called our office this am c/o nasal drainage and a sinus infection that has lasted for 2 weeks.  He has been taking Benadryl and Zyrtec but no relief. Patient is to start Augmentin 875/125 mg po BID x 10 days.  Prescription called into [facility] per [facility] NP. Instructed patient to continue Zyrtec and Benadryl and also to drink plenty of fluids. He verbalizes understanding of instructions and will follow up with any further problems.

## 2019-11-08 DIAGNOSIS — H903 Sensorineural hearing loss, bilateral: Secondary | ICD-10-CM | POA: Diagnosis not present

## 2019-11-08 DIAGNOSIS — R42 Dizziness and giddiness: Secondary | ICD-10-CM | POA: Diagnosis not present

## 2019-11-08 DIAGNOSIS — R0683 Snoring: Secondary | ICD-10-CM | POA: Diagnosis not present

## 2019-11-15 DIAGNOSIS — R42 Dizziness and giddiness: Secondary | ICD-10-CM | POA: Diagnosis not present

## 2019-12-13 DIAGNOSIS — G4730 Sleep apnea, unspecified: Secondary | ICD-10-CM | POA: Diagnosis not present

## 2019-12-13 DIAGNOSIS — G4733 Obstructive sleep apnea (adult) (pediatric): Secondary | ICD-10-CM | POA: Diagnosis not present

## 2019-12-13 DIAGNOSIS — R42 Dizziness and giddiness: Secondary | ICD-10-CM | POA: Diagnosis not present

## 2019-12-13 DIAGNOSIS — H903 Sensorineural hearing loss, bilateral: Secondary | ICD-10-CM | POA: Diagnosis not present

## 2020-04-13 DIAGNOSIS — Z23 Encounter for immunization: Secondary | ICD-10-CM | POA: Diagnosis not present

## 2020-05-11 ENCOUNTER — Ambulatory Visit: Payer: Self-pay

## 2020-05-11 ENCOUNTER — Other Ambulatory Visit: Payer: Self-pay

## 2020-05-11 ENCOUNTER — Ambulatory Visit: Payer: Self-pay | Admitting: Medical

## 2020-05-11 VITALS — BP 160/100 | HR 94 | Temp 98.0°F | Resp 16

## 2020-05-11 DIAGNOSIS — Z20822 Contact with and (suspected) exposure to covid-19: Secondary | ICD-10-CM

## 2020-05-11 DIAGNOSIS — J0110 Acute frontal sinusitis, unspecified: Secondary | ICD-10-CM

## 2020-05-11 DIAGNOSIS — Z889 Allergy status to unspecified drugs, medicaments and biological substances status: Secondary | ICD-10-CM

## 2020-05-11 LAB — POC COVID19 BINAXNOW: SARS Coronavirus 2 Ag: NEGATIVE

## 2020-05-11 MED ORDER — OLOPATADINE HCL 0.1 % OP SOLN: 1.0000 [drp] | Freq: Two times a day (BID) | OPHTHALMIC | 12 refills | Status: DC

## 2020-05-11 MED ORDER — AMOXICILLIN-POT CLAVULANATE 875-125 MG PO TABS: 1.0000 | ORAL_TABLET | Freq: Two times a day (BID) | ORAL | 0 refills | Status: DC

## 2020-05-11 NOTE — Progress Notes (Signed)
Subjective:    Patient ID: Jose Edwards, male    DOB: [birth year], 65 y.o.   MRN: [id number]  HPI 65 yo male in non acute distess with  2 week history of  Started with watery eyes with redness no itching but burning. With sinus pain on forehead.  Popping in left ears.   OTC Zyrtec ( makes him jittery) taking the plain tablet  and somethines takes Benadryl though this makes him sleepy.    Recheck of blood pressure , 160/90 manual  Left arm   [employer] vaccinated and boosted.  160/100 right arm  Sitting, normal cuff manual 94 HR  98.0 tympanic O2 98% resp 16   Allergies  Allergen Reactions  . Gabapentin Other (See Comments)  . Pseudoephedrine     REACTION: makes drowsy    Current Outpatient Medications:  .  amoxicillin-clavulanate (AUGMENTIN) 875-125 MG tablet, Take 1 tablet by mouth 2 (two) times daily., Disp: 20 tablet, Rfl: 0 .  olopatadine (PATADAY) 0.1 % ophthalmic solution, Place 1 drop into both eyes 2 (two) times daily., Disp: 5 mL, Rfl: 12 .  cholecalciferol (VITAMIN D) 1000 UNITS tablet, Take 2,000 Units by mouth 2 (two) times a week. , Disp: , Rfl:  .  cyclobenzaprine (FLEXERIL) 5 MG tablet, Take 1 tablet (5 mg total) by mouth at bedtime as needed for muscle spasms. (Patient not taking: Reported on 05/11/2020), Disp: 20 tablet, Rfl: 0 .  fluticasone (FLONASE) 50 MCG/ACT nasal spray, Place 2 sprays into both nostrils daily., Disp: 16 g, Rfl: 2 .  omeprazole (PRILOSEC) 20 MG capsule, Take 20 mg by mouth daily., Disp: , Rfl:  .  traZODone (DESYREL) 50 MG tablet, TAKE 1/2 TO 1 (ONE-HALF TO ONE) TABLET BY MOUTH AT BEDTIME AS NEEDED FOR SLEEP, Disp: 30 tablet, Rfl: 10 .  vitamin B-12 (CYANOCOBALAMIN) 100 MCG tablet, Take 100 mcg by mouth every other day. , Disp: , Rfl:     Review of Systems  Constitutional: Negative for chills and fever.  HENT: Positive for postnasal drip. Negative for congestion, ear pain and sore throat.   Eyes: Positive for pain (behind eyes).   Respiratory: Positive for cough (mild).   Cardiovascular: Negative for chest pain.  Gastrointestinal: Negative for abdominal pain and diarrhea.  Neurological: Positive for headaches (slight , forehead by eyebrows.). Negative for dizziness and light-headedness.       Objective:   Physical Exam Constitutional:      Appearance: Normal appearance.  HENT:     Head: Normocephalic.     Mouth/Throat:     Mouth: Mucous membranes are dry.     Pharynx: Oropharynx is clear.  Eyes:     Extraocular Movements: Extraocular movements intact.     Pupils: Pupils are equal, round, and reactive to light.     Comments: Conjunctiva injected both eyes.  Cardiovascular:     Rate and Rhythm: Normal rate and regular rhythm.     Pulses: Normal pulses.     Heart sounds: Normal heart sounds.  Pulmonary:     Effort: Pulmonary effort is normal.     Breath sounds: Normal breath sounds.  Musculoskeletal:        General: Normal range of motion.     Cervical back: Normal range of motion and neck supple.  Lymphadenopathy:     Cervical: No cervical adenopathy.  Skin:    General: Skin is warm and dry.  Neurological:     General: No focal deficit present.  Mental Status: He is alert. Mental status is at baseline. He is disoriented.  Psychiatric:        Mood and Affect: Mood normal.        Behavior: Behavior normal.        Thought Content: Thought content normal.        Judgment: Judgment normal.         POC  Covid test  Negative Assessment & Plan:  Sinusitis Forehead Eustachian tube dysfunction bilateral OTC Claritin instead of Zyrtec ( hopefully with less side effects.) Elevetated BP without diagnosis of  Hypertension to follow up with his PCP. May come to clinic for BP checks, he says he has a cuff / monitor at home. Meds ordered this encounter  Medications  . amoxicillin-clavulanate (AUGMENTIN) 875-125 MG tablet    Sig: Take 1 tablet by mouth 2 (two) times daily.    Dispense:  20 tablet     Refill:  0  . olopatadine (PATADAY) 0.1 % ophthalmic solution    Sig: Place 1 drop into both eyes 2 (two) times daily.    Dispense:  5 mL    Refill:  12  rest, increase fluids, take OTC Motrin or Tylenol for fever or pain per package instructions. RT clinic in  3-5 days if not improving. Patient verbalzies understanding and has no questions at at discharge.

## 2020-05-11 NOTE — Patient Instructions (Signed)
Amoxicillin; Clavulanic Acid Tablets What is this medicine? AMOXICILLIN; CLAVULANIC ACID (a mox i SIL in; KLAV yoo lan ic AS id) is a penicillin antibiotic. It treats some infections caused by bacteria. It will not work for colds, the flu, or other viruses. This medicine may be used for other purposes; ask your health care provider or pharmacist if you have questions. COMMON BRAND NAME(S): Augmentin What should I tell my health care provider before I take this medicine? They need to know if you have any of these conditions: kidney disease liver disease mononucleosis stomach or intestine problems such as colitis an unusual or allergic reaction to amoxicillin, other penicillin or cephalosporin antibiotics, clavulanic acid, other medicines, foods, dyes, or preservatives pregnant or trying to get pregnant breast-feeding How should I use this medicine? Take this drug by mouth. Take it as directed on the prescription label at the same time every day. Take it with food at the start of a meal or snack. Take all of this drug unless your health care provider tells you to stop it early. Keep taking it even if you think you are better. Talk to your health care provider about the use of this drug in children. While it may be prescribed for selected conditions, precautions do apply. Overdosage: If you think you have taken too much of this medicine contact a poison control center or emergency room at once. NOTE: This medicine is only for you. Do not share this medicine with others. What if I miss a dose? If you miss a dose, take it as soon as you can. If it is almost time for your next dose, take only that dose. Do not take double or extra doses. What may interact with this medicine? allopurinol anticoagulants birth control pills methotrexate probenecid This list may not describe all possible interactions. Give your health care provider a list of all the medicines, herbs, non-prescription drugs, or  dietary supplements you use. Also tell them if you smoke, drink alcohol, or use illegal drugs. Some items may interact with your medicine. What should I watch for while using this medicine? Tell your health care provider if your symptoms do not start to get better or if they get worse. This medicine may cause serious skin reactions. They can happen weeks to months after starting the medicine. Contact your health care provider right away if you notice fevers or flu-like symptoms with a rash. The rash may be red or purple and then turn into blisters or peeling of the skin. Or, you might notice a red rash with swelling of the face, lips or lymph nodes in your neck or under your arms. Do not treat diarrhea with over the counter products. Contact your health care provider if you have diarrhea that lasts more than 2 days or if it is severe and watery. If you have diabetes, you may get a false-positive result for sugar in your urine. Check with your health care provider. Birth control may not work properly while you are taking this medicine. Talk to your health care provider about using an extra method of birth control. What side effects may I notice from receiving this medicine? Side effects that you should report to your doctor or health care provider as soon as possible: allergic reactions (skin rash, itching or hives; swelling of the face, lips, or tongue) bloody or watery diarrhea dark urine infection (fever, chills, cough, sore throat, or pain) kidney injury (trouble passing urine or change in the amount of urine) redness,   blistering, peeling, or loosening of the skin, including inside the mouth seizures thrush (white patches in the mouth or mouth sores) trouble breathing unusual bruising or bleeding unusually weak or tired Side effects that usually do not require medical attention (report to your doctor or health care provider if they continue or are  bothersome): diarrhea dizziness headache nausea, vomiting unusual vaginal discharge, itching, or odor upset stomach This list may not describe all possible side effects. Call your doctor for medical advice about side effects. You may report side effects to FDA at 1-800-FDA-1088. Where should I keep my medicine? Keep out of the reach of children and pets. Store at room temperature between 20 and 25 degrees C (68 and 77 degrees F). Throw away any unused drug after the expiration date. NOTE: This sheet is a summary. It may not cover all possible information. If you have questions about this medicine, talk to your doctor, pharmacist, or health care provider.  2021 Elsevier/Gold Standard (2020-02-08 09:45:03) Sinusitis, Adult Sinusitis is soreness and swelling (inflammation) of your sinuses. Sinuses are hollow spaces in the bones around your face. They are located: Around your eyes. In the middle of your forehead. Behind your nose. In your cheekbones. Your sinuses and nasal passages are lined with a fluid called mucus. Mucus drains out of your sinuses. Swelling can trap mucus in your sinuses. This lets germs (bacteria, virus, or fungus) grow, which leads to infection. Most of the time, this condition is caused by a virus. What are the causes? This condition is caused by: Allergies. Asthma. Germs. Things that block your nose or sinuses. Growths in the nose (nasal polyps). Chemicals or irritants in the air. Fungus (rare). What increases the risk? You are more likely to develop this condition if: You have a weak body defense system (immune system). You do a lot of swimming or diving. You use nasal sprays too much. You smoke. What are the signs or symptoms? The main symptoms of this condition are pain and a feeling of pressure around the sinuses. Other symptoms include: Stuffy nose (congestion). Runny nose (drainage). Swelling and warmth in the sinuses. Headache. Toothache. A cough  that may get worse at night. Mucus that collects in the throat or the back of the nose (postnasal drip). Being unable to smell and taste. Being very tired (fatigue). A fever. Sore throat. Bad breath. How is this diagnosed? This condition is diagnosed based on: Your symptoms. Your medical history. A physical exam. Tests to find out if your condition is short-term (acute) or long-term (chronic). Your doctor may: Check your nose for growths (polyps). Check your sinuses using a tool that has a light (endoscope). Check for allergies or germs. Do imaging tests, such as an MRI or CT scan. How is this treated? Treatment for this condition depends on the cause and whether it is short-term or long-term. If caused by a virus, your symptoms should go away on their own within 10 days. You may be given medicines to relieve symptoms. They include: Medicines that shrink swollen tissue in the nose. Medicines that treat allergies (antihistamines). A spray that treats swelling of the nostrils.  Rinses that help get rid of thick mucus in your nose (nasal saline washes). If caused by bacteria, your doctor may wait to see if you will get better without treatment. You may be given antibiotic medicine if you have: A very bad infection. A weak body defense system. If caused by growths in the nose, you may need to have surgery.   Follow these instructions at home: Medicines Take, use, or apply over-the-counter and prescription medicines only as told by your doctor. These may include nasal sprays. If you were prescribed an antibiotic medicine, take it as told by your doctor. Do not stop taking the antibiotic even if you start to feel better. Hydrate and humidify Drink enough water to keep your pee (urine) pale yellow. Use a cool mist humidifier to keep the humidity level in your home above 50%. Breathe in steam for 10-15 minutes, 3-4 times a day, or as told by your doctor. You can do this in the bathroom while  a hot shower is running. Try not to spend time in cool or dry air.   Rest Rest as much as you can. Sleep with your head raised (elevated). Make sure you get enough sleep each night. General instructions Put a warm, moist washcloth on your face 3-4 times a day, or as often as told by your doctor. This will help with discomfort. Wash your hands often with soap and water. If there is no soap and water, use hand sanitizer. Do not smoke. Avoid being around people who are smoking (secondhand smoke). Keep all follow-up visits as told by your doctor. This is important.   Contact a doctor if: You have a fever. Your symptoms get worse. Your symptoms do not get better within 10 days. Get help right away if: You have a very bad headache. You cannot stop throwing up (vomiting). You have very bad pain or swelling around your face or eyes. You have trouble seeing. You feel confused. Your neck is stiff. You have trouble breathing. Summary Sinusitis is swelling of your sinuses. Sinuses are hollow spaces in the bones around your face. This condition is caused by tissues in your nose that become inflamed or swollen. This traps germs. These can lead to infection. If you were prescribed an antibiotic medicine, take it as told by your doctor. Do not stop taking it even if you start to feel better. Keep all follow-up visits as told by your doctor. This is important. This information is not intended to replace advice given to you by your health care provider. Make sure you discuss any questions you have with your health care provider. Document Revised: 08/17/2017 Document Reviewed: 08/17/2017 Elsevier Patient Education  2021 [employer]  

## 2020-09-04 ENCOUNTER — Other Ambulatory Visit: Payer: Self-pay

## 2020-09-04 ENCOUNTER — Ambulatory Visit: Payer: Self-pay | Admitting: Medical

## 2020-09-04 VITALS — BP 168/82 | HR 98 | Wt 233.2 lb

## 2020-09-04 DIAGNOSIS — Z20822 Contact with and (suspected) exposure to covid-19: Secondary | ICD-10-CM

## 2020-09-04 DIAGNOSIS — R059 Cough, unspecified: Secondary | ICD-10-CM

## 2020-09-04 LAB — POC COVID19 BINAXNOW: SARS Coronavirus 2 Ag: POSITIVE — AB

## 2020-09-04 MED ORDER — BENZONATATE 100 MG PO CAPS: ORAL_CAPSULE | ORAL | 0 refills | Status: DC

## 2020-09-04 NOTE — Patient Instructions (Addendum)
Start taking  OTC Zyrtec and Flonase per package instructions. Follow up with your doctor if not improving in  3-5 dalys. Take OTC Motrin or Tylenol per package instructions for fever or discomfort, or not feeling well. Isolate and increase fluids.  May return to work on June 10th as long as all symptoms are improving ;and no fever x 24 hours with no OTC Motrin or Tylenol being taken      COVID-19: What to Do if You Are Sick If you have a fever, cough or other symptoms, you might have COVID-19. Most people have mild illness and are able to recover at home. If you are sick:  Keep track of your symptoms.  If you have an emergency warning sign (including trouble breathing), call 911. Steps to help prevent the spread of COVID-19 if you are sick If you are sick with COVID-19 or think you might have COVID-19, follow the steps below to care for yourself and to help protect other people in your home and community. Stay home except to get medical care  Stay home. Most people with COVID-19 have mild illness and can recover at home without medical care. Do not leave your home, except to get medical care. Do not visit public areas.  Take care of yourself. Get rest and stay hydrated. Take over-the-counter medicines, such as acetaminophen, to help you feel better.  Stay in touch with your doctor. Call before you get medical care. Be sure to get care if you have trouble breathing, or have any other emergency warning signs, or if you think it is an emergency.  Avoid public transportation, ride-sharing, or taxis. Separate yourself from other people As much as possible, stay in a specific room and away from other people and pets in your home. If possible, you should use a separate bathroom. If you need to be around other people or animals in or outside of the home, wear a mask. Tell your close contactsthat they may have been exposed to COVID-19. An infected person can spread COVID-19 starting 48 hours (or  2 days) before the person has any symptoms or tests positive. By letting your close contacts know they may have been exposed to COVID-19, you are helping to protect everyone.  Additional guidance is available for those living in close quarters and shared housing.  See COVID-19 and Animals if you have questions about pets.  If you are diagnosed with COVID-19, someone from the health department may call you. Answer the call to slow the spread. Monitor your symptoms  Symptoms of COVID-19 include fever, cough, or other symptoms.  Follow care instructions from your healthcare provider and local health department. Your local health authorities may give instructions on checking your symptoms and reporting information. When to seek emergency medical attention Look for emergency warning signs* for COVID-19. If someone is showing any of these signs, seek emergency medical care immediately:  Trouble breathing  Persistent pain or pressure in the chest  New confusion  Inability to wake or stay awake  Pale, gray, or blue-colored skin, lips, or nail beds, depending on skin tone *This list is not all possible symptoms. Please call your medical provider for any other symptoms that are severe or concerning to you. Call 911 or call ahead to your local emergency facility: Notify the operator that you are seeking care for someone who has or may have COVID-19. Call ahead before visiting your doctor  Call ahead. Many medical visits for routine care are being postponed or done  by phone or telemedicine.  If you have a medical appointment that cannot be postponed, call your doctor's office, and tell them you have or may have COVID-19. This will help the office protect themselves and other patients. Get  tested  If you have symptoms of COVID-19, get tested. While waiting for test results, you stay away from others, including staying apart from those living in your household.  You can visit your state,  tribal, local, and territorialhealth department's website to look for the latest local information on testing sites. If you are sick, wear a mask over your nose and mouth  You should wear a mask over your nose and mouth if you must be around other people or animals, including pets (even at home).  You don't need to wear the mask if you are alone. If you can't put on a mask (because of trouble breathing, for example), cover your coughs and sneezes in some other way. Try to stay at least 6 feet away from other people. This will help protect the people around you.  Masks should not be placed on young children under age 88 years, anyone who has trouble breathing, or anyone who is not able to remove the mask without help. Note: During the COVID-19 pandemic, medical grade facemasks are reserved for healthcare workers and some first responders. Cover your coughs and sneezes  Cover your mouth and nose with a tissue when you cough or sneeze.  Throw away used tissues in a lined trash can.  Immediately wash your hands with soap and water for at least 20 seconds. If soap and water are not available, clean your hands with an alcohol-based hand sanitizer that contains at least 60% alcohol. Clean your hands often  Wash your hands often with soap and water for at least 20 seconds. This is especially important after blowing your nose, coughing, or sneezing; going to the bathroom; and before eating or preparing food.  Use hand sanitizer if soap and water are not available. Use an alcohol-based hand sanitizer with at least 60% alcohol, covering all surfaces of your hands and rubbing them together until they feel dry.  Soap and water are the best option, especially if hands are visibly dirty.  Avoid touching your eyes, nose, and mouth with unwashed hands.  Handwashing Tips Avoid sharing personal household items  Do not share dishes, drinking glasses, cups, eating utensils, towels, or bedding with other  people in your home.  Wash these items thoroughly after using them with soap and water or put in the dishwasher. Clean all "high-touch" surfaces everyday  Clean and disinfect high-touch surfaces in your "sick room" and bathroom; wear disposable gloves. Let someone else clean and disinfect surfaces in common areas, but you should clean your bedroom and bathroom, if possible.  If a caregiver or other person needs to clean and disinfect a sick person's bedroom or bathroom, they should do so on an as-needed basis. The caregiver/other person should wear a mask and disposable gloves prior to cleaning. They should wait as long as possible after the person who is sick has used the bathroom before coming in to clean and use the bathroom. ? High-touch surfaces include phones, remote controls, counters, tabletops, doorknobs, bathroom fixtures, toilets, keyboards, tablets, and bedside tables.  Clean and disinfect areas that may have blood, stool, or body fluids on them.  Use household cleaners and disinfectants. Clean the area or item with soap and water or another detergent if it is dirty. Then, use a  household disinfectant. ? Be sure to follow the instructions on the label to ensure safe and effective use of the product. Many products recommend keeping the surface wet for several minutes to ensure germs are killed. Many also recommend precautions such as wearing gloves and making sure you have good ventilation during use of the product. ? Use a product from [employer] List N: Disinfectants for Coronavirus (COVID-19). ? Complete Disinfection Guidance When you can be around others after being sick with COVID-19 Deciding when you can be around others is different for different situations. Find out when you can safely end home isolation. For any additional questions about your care, contact your healthcare provider or state or local health department. 06/15/2019 Content source: [facility] for Immunization and  Respiratory Diseases ([facility]), Division of Viral Diseases This information is not intended to replace advice given to you by your health care provider. Make sure you discuss any questions you have with your health care provider. Document Revised: 01/30/2020 Document Reviewed: 01/30/2020 Elsevier Patient Education  2021 [employer].

## 2020-09-04 NOTE — Progress Notes (Signed)
   Subjective:    Patient ID: Jose Edwards, male    DOB: February 26, 1956, 65 y.o.   MRN: [id number]  HPI 64 yo male in non acute distress. Saturday with HA, ST from coughing a lot. Nasal congestion and runny nose.   Review of Systems  Constitutional: Positive for fever (on saturday). Negative for chills.  HENT: Positive for congestion, postnasal drip, rhinorrhea, sinus pressure, sinus pain and sore throat.   Respiratory: Positive for cough. Negative for shortness of breath.   Cardiovascular: Negative for chest pain.  Gastrointestinal: Negative for abdominal pain and diarrhea.  Allergic/Immunologic: Positive for environmental allergies (taking nothing).  Neurological: Positive for headaches. Negative for dizziness and light-headedness.       Objective:   Physical Exam Vitals and nursing note reviewed.  Constitutional:      Appearance: Normal appearance.  Cardiovascular:     Pulses: Normal pulses.  Musculoskeletal:        General: Normal range of motion.  Skin:    General: Skin is warm and dry.  Neurological:     General: No focal deficit present.     Mental Status: He is alert and oriented to person, place, and time. Mental status is at baseline.  Psychiatric:        Mood and Affect: Mood normal.        Behavior: Behavior normal.        Thought Content: Thought content normal.        Judgment: Judgment normal.           Assessment & Plan:  Encounter for Covid-19 Covid-19 infection Start taking  OTC Zyrtec and Flonase per package instructions. Follow up with your doctor if not improving in  3-5 dalys. Take OTC Motrin or Tylenol per package instructions for fever or discomfort, or not feeling well. Isolate and increase fluids.  May return to work on June 10th as long as all symptoms are improving ;and no fever x 24 hours with no OTC Motrin or Tylenol being taken.Patient verbalizes understanding and has no questions at discharge.

## 2020-10-31 ENCOUNTER — Encounter: Payer: Self-pay | Admitting: Internal Medicine

## 2020-11-26 ENCOUNTER — Emergency Department (HOSPITAL_COMMUNITY): Payer: BC Managed Care – PPO

## 2020-11-26 ENCOUNTER — Inpatient Hospital Stay (HOSPITAL_COMMUNITY)
Admission: EM | Admit: 2020-11-26 | Discharge: 2020-11-28 | DRG: 247 | Disposition: A | Discharge status: Home / Self Care | Payer: BC Managed Care – PPO | Attending: Cardiovascular Disease | Admitting: Cardiovascular Disease

## 2020-11-26 ENCOUNTER — Encounter (HOSPITAL_COMMUNITY)
Admission: EM | Disposition: A | Discharge status: Home / Self Care | Payer: Self-pay | Source: Home / Self Care | Attending: Cardiovascular Disease

## 2020-11-26 ENCOUNTER — Other Ambulatory Visit: Payer: Self-pay

## 2020-11-26 DIAGNOSIS — Z809 Family history of malignant neoplasm, unspecified: Secondary | ICD-10-CM | POA: Diagnosis not present

## 2020-11-26 DIAGNOSIS — I2510 Atherosclerotic heart disease of native coronary artery without angina pectoris: Secondary | ICD-10-CM

## 2020-11-26 DIAGNOSIS — R0789 Other chest pain: Secondary | ICD-10-CM | POA: Diagnosis not present

## 2020-11-26 DIAGNOSIS — Z9861 Coronary angioplasty status: Secondary | ICD-10-CM | POA: Diagnosis not present

## 2020-11-26 DIAGNOSIS — Z955 Presence of coronary angioplasty implant and graft: Secondary | ICD-10-CM | POA: Diagnosis not present

## 2020-11-26 DIAGNOSIS — I472 Ventricular tachycardia: Secondary | ICD-10-CM | POA: Diagnosis not present

## 2020-11-26 DIAGNOSIS — I10 Essential (primary) hypertension: Secondary | ICD-10-CM | POA: Diagnosis not present

## 2020-11-26 DIAGNOSIS — E785 Hyperlipidemia, unspecified: Secondary | ICD-10-CM | POA: Diagnosis not present

## 2020-11-26 DIAGNOSIS — N281 Cyst of kidney, acquired: Secondary | ICD-10-CM | POA: Diagnosis not present

## 2020-11-26 DIAGNOSIS — M25571 Pain in right ankle and joints of right foot: Secondary | ICD-10-CM | POA: Diagnosis not present

## 2020-11-26 DIAGNOSIS — M1990 Unspecified osteoarthritis, unspecified site: Secondary | ICD-10-CM | POA: Diagnosis present

## 2020-11-26 DIAGNOSIS — R079 Chest pain, unspecified: Secondary | ICD-10-CM | POA: Diagnosis not present

## 2020-11-26 DIAGNOSIS — K219 Gastro-esophageal reflux disease without esophagitis: Secondary | ICD-10-CM | POA: Diagnosis present

## 2020-11-26 DIAGNOSIS — E669 Obesity, unspecified: Secondary | ICD-10-CM | POA: Diagnosis present

## 2020-11-26 DIAGNOSIS — Z79899 Other long term (current) drug therapy: Secondary | ICD-10-CM | POA: Diagnosis not present

## 2020-11-26 DIAGNOSIS — K4020 Bilateral inguinal hernia, without obstruction or gangrene, not specified as recurrent: Secondary | ICD-10-CM | POA: Diagnosis not present

## 2020-11-26 DIAGNOSIS — I255 Ischemic cardiomyopathy: Secondary | ICD-10-CM | POA: Clinically undetermined

## 2020-11-26 DIAGNOSIS — Z888 Allergy status to other drugs, medicaments and biological substances status: Secondary | ICD-10-CM | POA: Diagnosis not present

## 2020-11-26 DIAGNOSIS — Z833 Family history of diabetes mellitus: Secondary | ICD-10-CM | POA: Diagnosis not present

## 2020-11-26 DIAGNOSIS — I2102 ST elevation (STEMI) myocardial infarction involving left anterior descending coronary artery: Secondary | ICD-10-CM | POA: Diagnosis not present

## 2020-11-26 DIAGNOSIS — I25119 Atherosclerotic heart disease of native coronary artery with unspecified angina pectoris: Secondary | ICD-10-CM | POA: Diagnosis not present

## 2020-11-26 DIAGNOSIS — Z8709 Personal history of other diseases of the respiratory system: Secondary | ICD-10-CM | POA: Diagnosis not present

## 2020-11-26 DIAGNOSIS — I714 Abdominal aortic aneurysm, without rupture: Secondary | ICD-10-CM | POA: Diagnosis not present

## 2020-11-26 DIAGNOSIS — I2109 ST elevation (STEMI) myocardial infarction involving other coronary artery of anterior wall: Principal | ICD-10-CM | POA: Diagnosis present

## 2020-11-26 DIAGNOSIS — Z20822 Contact with and (suspected) exposure to covid-19: Secondary | ICD-10-CM | POA: Diagnosis present

## 2020-11-26 DIAGNOSIS — I213 ST elevation (STEMI) myocardial infarction of unspecified site: Secondary | ICD-10-CM | POA: Diagnosis not present

## 2020-11-26 DIAGNOSIS — K5730 Diverticulosis of large intestine without perforation or abscess without bleeding: Secondary | ICD-10-CM | POA: Diagnosis not present

## 2020-11-26 HISTORY — DX: Atherosclerotic heart disease of native coronary artery with unspecified angina pectoris: I25.119

## 2020-11-26 HISTORY — DX: ST elevation (STEMI) myocardial infarction involving other coronary artery of anterior wall: I21.09

## 2020-11-26 HISTORY — PX: CORONARY STENT INTERVENTION: CATH118234

## 2020-11-26 HISTORY — PX: LEFT HEART CATH AND CORONARY ANGIOGRAPHY: CATH118249

## 2020-11-26 LAB — CBC WITH DIFFERENTIAL/PLATELET
Abs Immature Granulocytes: 0.01 10*3/uL (ref 0.00–0.07)
Basophils Absolute: 0 10*3/uL (ref 0.0–0.1)
Basophils Relative: 1 %
Eosinophils Absolute: 0.1 10*3/uL (ref 0.0–0.5)
Eosinophils Relative: 2 %
HCT: 40.4 % (ref 39.0–52.0)
Hemoglobin: 13.9 g/dL (ref 13.0–17.0)
Immature Granulocytes: 0 %
Lymphocytes Relative: 16 %
Lymphs Abs: 0.9 10*3/uL (ref 0.7–4.0)
MCH: 30.3 pg (ref 26.0–34.0)
MCHC: 34.4 g/dL (ref 30.0–36.0)
MCV: 88.2 fL (ref 80.0–100.0)
Monocytes Absolute: 0.4 10*3/uL (ref 0.1–1.0)
Monocytes Relative: 7 %
Neutro Abs: 4.2 10*3/uL (ref 1.7–7.7)
Neutrophils Relative %: 74 %
Platelets: 224 10*3/uL (ref 150–400)
RBC: 4.58 MIL/uL (ref 4.22–5.81)
RDW: 12.7 % (ref 11.5–15.5)
WBC: 5.7 10*3/uL (ref 4.0–10.5)
nRBC: 0 % (ref 0.0–0.2)

## 2020-11-26 LAB — POCT ACTIVATED CLOTTING TIME
Activated Clotting Time: 254 seconds
Activated Clotting Time: 341 seconds

## 2020-11-26 LAB — BASIC METABOLIC PANEL
Anion gap: 9 (ref 5–15)
BUN: 12 mg/dL (ref 8–23)
CO2: 22 mmol/L (ref 22–32)
Calcium: 9.3 mg/dL (ref 8.9–10.3)
Chloride: 106 mmol/L (ref 98–111)
Creatinine, Ser: 1.01 mg/dL (ref 0.61–1.24)
GFR, Estimated: >60 mL/min (ref >60)
Glucose, Bld: 138 mg/dL — ABNORMAL HIGH (ref 70–99)
Potassium: 3.6 mmol/L (ref 3.5–5.1)
Sodium: 137 mmol/L (ref 135–145)

## 2020-11-26 LAB — PROTIME-INR
INR: 1.1 (ref 0.8–1.2)
Prothrombin Time: 13.8 seconds (ref 11.4–15.2)

## 2020-11-26 LAB — LIPID PANEL
Cholesterol: 198 mg/dL (ref 0–200)
HDL: 38 mg/dL — ABNORMAL LOW (ref >40)
LDL Cholesterol: 134 mg/dL — ABNORMAL HIGH (ref 0–99)
Total CHOL/HDL Ratio: 5.2 RATIO
Triglycerides: 132 mg/dL (ref <150)
VLDL: 26 mg/dL (ref 0–40)

## 2020-11-26 LAB — MRSA NEXT GEN BY PCR, NASAL: MRSA by PCR Next Gen: NOT DETECTED

## 2020-11-26 LAB — HEMOGLOBIN A1C
Hgb A1c MFr Bld: 5.6 % (ref 4.8–5.6)
Mean Plasma Glucose: 114.02 mg/dL

## 2020-11-26 LAB — APTT: aPTT: 29 seconds (ref 24–36)

## 2020-11-26 LAB — TROPONIN I (HIGH SENSITIVITY)
Troponin I (High Sensitivity): 79 ng/L — ABNORMAL HIGH (ref <18)
Troponin I (High Sensitivity): >24000 ng/L (ref <18)

## 2020-11-26 SURGERY — LEFT HEART CATH AND CORONARY ANGIOGRAPHY
Anesthesia: LOCAL

## 2020-11-26 MED ORDER — TICAGRELOR 90 MG PO TABS
ORAL_TABLET | ORAL | Status: AC
  Filled 2020-11-26: qty 2

## 2020-11-26 MED ORDER — MORPHINE SULFATE (PF) 2 MG/ML IV SOLN: 2.0000 mg | INTRAVENOUS | Status: DC | PRN

## 2020-11-26 MED ORDER — CHLORHEXIDINE GLUCONATE CLOTH 2 % EX PADS
6.0000 | MEDICATED_PAD | Freq: Every day | CUTANEOUS | Status: DC
  Administered 2020-11-26 – 2020-11-27 (×2): 6 via TOPICAL

## 2020-11-26 MED ORDER — LIDOCAINE HCL (PF) 1 % IJ SOLN
INTRAMUSCULAR | Status: DC | PRN
  Administered 2020-11-26: 2 mL

## 2020-11-26 MED ORDER — SODIUM CHLORIDE 0.9 % IV SOLN
INTRAVENOUS | Status: DC
Start: 2020-11-26 — End: 2020-11-28
  Administered 2020-11-26: 1000 mL via INTRAVENOUS

## 2020-11-26 MED ORDER — HEPARIN SODIUM (PORCINE) 1000 UNIT/ML IJ SOLN
INTRAMUSCULAR | Status: DC | PRN
  Administered 2020-11-26: 4000 [IU] via INTRAVENOUS
  Administered 2020-11-26: 3000 [IU] via INTRAVENOUS
  Administered 2020-11-26: 4000 [IU] via INTRAVENOUS

## 2020-11-26 MED ORDER — IOHEXOL 350 MG/ML SOLN
100.0000 mL | Freq: Once | INTRAVENOUS | Status: AC | PRN
  Administered 2020-11-26: 100 mL via INTRAVENOUS

## 2020-11-26 MED ORDER — POTASSIUM CHLORIDE CRYS ER 20 MEQ PO TBCR
40.0000 meq | EXTENDED_RELEASE_TABLET | Freq: Every day | ORAL | Status: DC
  Administered 2020-11-26: 40 meq via ORAL
  Filled 2020-11-26: qty 2

## 2020-11-26 MED ORDER — FENTANYL CITRATE (PF) 100 MCG/2ML IJ SOLN
INTRAMUSCULAR | Status: DC | PRN
  Administered 2020-11-26 (×2): 25 ug via INTRAVENOUS

## 2020-11-26 MED ORDER — SODIUM CHLORIDE 0.9% FLUSH: 3.0000 mL | INTRAVENOUS | Status: DC | PRN

## 2020-11-26 MED ORDER — TIROFIBAN (AGGRASTAT) BOLUS VIA INFUSION
25.0000 ug/kg | Freq: Once | INTRAVENOUS | Status: AC
  Administered 2020-11-26: 2495 ug via INTRAVENOUS
  Filled 2020-11-26: qty 50

## 2020-11-26 MED ORDER — FENTANYL CITRATE (PF) 100 MCG/2ML IJ SOLN
INTRAMUSCULAR | Status: AC
  Filled 2020-11-26: qty 2

## 2020-11-26 MED ORDER — SODIUM CHLORIDE 0.9 % IV SOLN: 250.0000 mL | INTRAVENOUS | Status: DC | PRN

## 2020-11-26 MED ORDER — TICAGRELOR 90 MG PO TABS
ORAL_TABLET | ORAL | Status: DC | PRN
  Administered 2020-11-26: 180 mg via ORAL

## 2020-11-26 MED ORDER — TICAGRELOR 90 MG PO TABS
90.0000 mg | ORAL_TABLET | Freq: Two times a day (BID) | ORAL | Status: DC
  Administered 2020-11-27 – 2020-11-28 (×3): 90 mg via ORAL
  Filled 2020-11-26 (×4): qty 1

## 2020-11-26 MED ORDER — SODIUM CHLORIDE 0.9% FLUSH
3.0000 mL | Freq: Two times a day (BID) | INTRAVENOUS | Status: DC
  Administered 2020-11-26 – 2020-11-28 (×4): 3 mL via INTRAVENOUS

## 2020-11-26 MED ORDER — SODIUM CHLORIDE 0.9 % IV SOLN: INTRAVENOUS | Status: AC

## 2020-11-26 MED ORDER — HEPARIN SODIUM (PORCINE) 5000 UNIT/ML IJ SOLN
4000.0000 [IU] | Freq: Once | INTRAMUSCULAR | Status: AC
  Administered 2020-11-26: 4000 [IU] via INTRAVENOUS

## 2020-11-26 MED ORDER — LABETALOL HCL 5 MG/ML IV SOLN: 10.0000 mg | INTRAVENOUS | Status: AC | PRN

## 2020-11-26 MED ORDER — HYDRALAZINE HCL 20 MG/ML IJ SOLN: 10.0000 mg | INTRAMUSCULAR | Status: AC | PRN

## 2020-11-26 MED ORDER — POTASSIUM CHLORIDE ER 10 MEQ PO TBCR: 40.0000 meq | EXTENDED_RELEASE_TABLET | Freq: Every day | ORAL | Status: DC

## 2020-11-26 MED ORDER — ATORVASTATIN CALCIUM 80 MG PO TABS
80.0000 mg | ORAL_TABLET | Freq: Every day | ORAL | Status: DC
  Administered 2020-11-26 – 2020-11-28 (×3): 80 mg via ORAL
  Filled 2020-11-26 (×3): qty 1

## 2020-11-26 MED ORDER — ACETAMINOPHEN 325 MG PO TABS
650.0000 mg | ORAL_TABLET | ORAL | Status: DC | PRN
Start: 2020-11-26 — End: 2020-11-28

## 2020-11-26 MED ORDER — LIDOCAINE HCL (PF) 1 % IJ SOLN
INTRAMUSCULAR | Status: AC
  Filled 2020-11-26: qty 30

## 2020-11-26 MED ORDER — TIROFIBAN HCL IN NACL 5-0.9 MG/100ML-% IV SOLN
0.1500 ug/kg/min | INTRAVENOUS | Status: AC
  Administered 2020-11-26 (×2): 0.15 ug/kg/min via INTRAVENOUS
  Filled 2020-11-26 (×2): qty 100

## 2020-11-26 MED ORDER — ONDANSETRON HCL 4 MG/2ML IJ SOLN: 4.0000 mg | Freq: Four times a day (QID) | INTRAMUSCULAR | Status: DC | PRN

## 2020-11-26 MED ORDER — ASPIRIN EC 81 MG PO TBEC
81.0000 mg | DELAYED_RELEASE_TABLET | Freq: Every day | ORAL | Status: DC
  Administered 2020-11-27 – 2020-11-28 (×2): 81 mg via ORAL
  Filled 2020-11-26 (×2): qty 1

## 2020-11-26 MED ORDER — METOPROLOL TARTRATE 25 MG PO TABS
25.0000 mg | ORAL_TABLET | Freq: Two times a day (BID) | ORAL | Status: DC
  Administered 2020-11-26 – 2020-11-27 (×3): 25 mg via ORAL
  Filled 2020-11-26 (×3): qty 1

## 2020-11-26 MED ORDER — FENTANYL CITRATE (PF) 100 MCG/2ML IJ SOLN
INTRAMUSCULAR | Status: AC
  Administered 2020-11-26: 75 ug
  Filled 2020-11-26: qty 2

## 2020-11-26 MED ORDER — HEPARIN (PORCINE) IN NACL 1000-0.9 UT/500ML-% IV SOLN
INTRAVENOUS | Status: DC | PRN
  Administered 2020-11-26 (×2): 500 mL

## 2020-11-26 MED ORDER — HEPARIN (PORCINE) IN NACL 1000-0.9 UT/500ML-% IV SOLN
INTRAVENOUS | Status: AC
  Filled 2020-11-26: qty 1000

## 2020-11-26 MED ORDER — ASPIRIN 81 MG PO CHEW
324.0000 mg | CHEWABLE_TABLET | Freq: Once | ORAL | Status: AC
  Administered 2020-11-26: 324 mg via ORAL

## 2020-11-26 MED ORDER — HEPARIN SODIUM (PORCINE) 1000 UNIT/ML IJ SOLN
INTRAMUSCULAR | Status: AC
  Filled 2020-11-26: qty 1

## 2020-11-26 MED ORDER — NITROGLYCERIN 1 MG/10 ML FOR IR/CATH LAB
INTRA_ARTERIAL | Status: AC
  Filled 2020-11-26: qty 10

## 2020-11-26 MED ORDER — NITROGLYCERIN 0.4 MG SL SUBL
0.4000 mg | SUBLINGUAL_TABLET | Freq: Once | SUBLINGUAL | Status: AC
  Administered 2020-11-26: 0.4 mg via SUBLINGUAL

## 2020-11-26 MED ORDER — VERAPAMIL HCL 2.5 MG/ML IV SOLN
INTRA_ARTERIAL | Status: DC | PRN
  Administered 2020-11-26: 10 mL via INTRA_ARTERIAL

## 2020-11-26 MED ORDER — VERAPAMIL HCL 2.5 MG/ML IV SOLN
INTRAVENOUS | Status: AC
  Filled 2020-11-26: qty 2

## 2020-11-26 MED ORDER — FENTANYL CITRATE PF 50 MCG/ML IJ SOSY: 75.0000 ug | PREFILLED_SYRINGE | Freq: Once | INTRAMUSCULAR | Status: DC

## 2020-11-26 MED ORDER — IOHEXOL 350 MG/ML SOLN
INTRAVENOUS | Status: DC | PRN
  Administered 2020-11-26: 140 mL via INTRA_ARTERIAL

## 2020-11-26 SURGICAL SUPPLY — 18 items
BALLN SAPPHIRE 2.0X12 (BALLOONS) ×2
BALLOON SAPPHIRE 2.0X12 (BALLOONS) IMPLANT
CATH INFINITI 5FR ANG PIGTAIL (CATHETERS) ×1 IMPLANT
CATH OPTITORQUE TIG 4.0 5F (CATHETERS) ×1 IMPLANT
CATH VISTA GUIDE 6FR XBLAD3.5 (CATHETERS) ×1 IMPLANT
DEVICE RAD COMP TR BAND LRG (VASCULAR PRODUCTS) ×1 IMPLANT
GLIDESHEATH SLEND A-KIT 6F 22G (SHEATH) ×1 IMPLANT
GUIDEWIRE INQWIRE 1.5J.035X260 (WIRE) IMPLANT
INQWIRE 1.5J .035X260CM (WIRE) ×2
KIT ENCORE 26 ADVANTAGE (KITS) ×2 IMPLANT
KIT HEART LEFT (KITS) ×2 IMPLANT
PACK CARDIAC CATHETERIZATION (CUSTOM PROCEDURE TRAY) ×2 IMPLANT
SHEATH PROBE COVER 6X72 (BAG) ×1 IMPLANT
STENT ONYX FRONTIER 2.25X18 (Permanent Stent) ×1 IMPLANT
TRANSDUCER W/STOPCOCK (MISCELLANEOUS) ×2 IMPLANT
TUBING CIL FLEX 10 FLL-RA (TUBING) ×2 IMPLANT
WIRE ASAHI PROWATER 180CM (WIRE) ×1 IMPLANT
WIRE HI TORQ VERSACORE-J 145CM (WIRE) ×1 IMPLANT

## 2020-11-26 NOTE — ED Provider Notes (Addendum)
Emergency Medicine Provider Triage Evaluation Note  Jose Edwards , a 65 y.o. male  was evaluated in triage.  Pt complains of intermittient pain between shoulder blades for a couple of weeks. CP and SOB today. Not dizzy. No hx of hypertension, no smoking hx  Review of Systems  Positive: Cp, sob, back pain Negative: Dizziness, presyncope, headache  Physical Exam  BP (!) 184/103 (BP Location: Right Arm)   Pulse 100   Temp 97.9 F (36.6 C) (Oral)   Resp 17   SpO2 97%  Gen:   Awake, no distress   Resp:  Normal effort, RRR MSK:   Moves extremities without difficulty  Other:  Strong radial pulses bilat  Medical Decision Making  Medically screening exam initiated at 4:42 PM.  Appropriate orders placed.  Jose Edwards was informed that the remainder of the evaluation will be completed by another provider, this initial triage assessment does not replace that evaluation, and the importance of remaining in the ED until their evaluation is complete.  Does not appear to dissecting. Elevated BP, otherwise stable and NAD  EKG STEMI   Saddie Benders, PA-C 11/26/20 1646    Pinchus Weckwerth A, PA-C 11/26/20 1650    Kommor, Wyn Forster, MD 11/27/20 [phone]

## 2020-11-26 NOTE — ED Provider Notes (Signed)
[facility]   CSN: [id number] Arrival date & time: 11/26/20  1634     History Chief Complaint  Patient presents with   Chest Pain    Jose Edwards is a 65 y.o. male with PMHx GERD who presents for evaluation of chest pain.  Patient states that he experienced acute onset of bilateral shoulder pain at approximately 9 AM this morning.  He states that it later radiated to the center of his back and then to the center of his chest.  He felt associated nausea and shortness of breath.  No syncope, numbness, weakness, or diaphoresis.  He subsequently presented to the emergency department for further evaluation.       Past Medical History:  Diagnosis Date   Benign positional vertigo    ED (erectile dysfunction)    GERD (gastroesophageal reflux disease)    Osteoarthrosis, unspecified whether generalized or localized, unspecified site     Patient Active Problem List   Diagnosis Date Noted   Derangement of knee 09/24/2017   Meralgia paresthetica of both lower extremities 09/12/2016   Bilateral carpal tunnel syndrome 09/12/2016   Sleep disturbance 05/21/2016   Routine general medical examination at a health care facility 06/22/2012   Obesity (BMI 30-39.9) 06/22/2012   ED (erectile dysfunction)    GERD 02/27/2010   Osteoarthritis, multiple sites 02/27/2010    Past Surgical History:  Procedure Laterality Date   ANKLE SURGERY  2008   fracture left ankle ( 2 pins)   ROTATOR CUFF REPAIR  2005   right shoulder       Family History  Problem Relation Age of Onset   Cancer Mother    Diabetes Sister    Diabetes Paternal Grandmother    Cancer Father    Coronary artery disease Neg Hx    Hypertension Neg Hx     Social History   Tobacco Use   Smoking status: Never   Smokeless tobacco: Never  Vaping Use   Vaping Use: Never used  Substance Use Topics   Alcohol use: Yes    Comment: Beer socially (once weekly)    Drug use: No    Home Medications Prior to Admission medications   Medication Sig Start Date End Date Taking? Authorizing Provider  benzonatate (TESSALON PERLES) 100 MG capsule 1-2 capsules every  8 hours as needed for cough 09/04/20   Ratcliffe, Heather R, PA-C  cholecalciferol (VITAMIN D) 1000 UNITS tablet Take 2,000 Units by mouth 2 (two) times a week.     [provider]  cyclobenzaprine (FLEXERIL) 5 MG tablet Take 1 tablet (5 mg total) by mouth at bedtime as needed for muscle spasms. Patient not taking: Reported on 05/11/2020 01/21/19   Lorre MunroeBaity, Regina W, NP  fluticasone [facility](FLONASE) 50 MCG/ACT nasal spray Place 2 sprays into both nostrils daily. 09/24/17   Flinchum, Eula FriedMichelle S, FNP  olopatadine (PATADAY) 0.1 % ophthalmic solution Place 1 drop into both eyes 2 (two) times daily. 05/11/20   Ratcliffe, Heather R, PA-C  omeprazole (PRILOSEC) 20 MG capsule Take 20 mg by mouth daily.    [provider]  traZODone (DESYREL) 50 MG tablet TAKE 1/2 TO 1 (ONE-HALF TO ONE) TABLET BY MOUTH AT BEDTIME AS NEEDED FOR SLEEP 08/05/16   Karie SchwalbeLetvak, Richard I, MD  vitamin B-12 (CYANOCOBALAMIN) 100 MCG tablet Take 100 mcg by mouth every other day.     [provider]    Allergies    Gabapentin and Pseudoephedrine  Review of  Systems   Review of Systems  Constitutional:  Negative for chills and fever.  HENT:  Negative for ear pain and sore throat.   Eyes:  Negative for pain and visual disturbance.  Respiratory:  Positive for shortness of breath. Negative for cough.   Cardiovascular:  Positive for chest pain. Negative for palpitations.  Gastrointestinal:  Positive for nausea. Negative for abdominal pain and vomiting.  Genitourinary:  Negative for dysuria and hematuria.  Musculoskeletal:  Negative for arthralgias and back pain.  Skin:  Negative for color change and rash.  Neurological:  Negative for seizures and syncope.  All other systems reviewed and are negative.  Physical Exam Updated  Vital Signs BP (!) 184/103 (BP Location: Right Arm)   Pulse 100   Temp 97.9 F (36.6 C) (Oral)   Resp 17   Ht 5\' 7"  (1.702 m)   Wt 99.8 kg   SpO2 97%   BMI 34.46 kg/m   Physical Exam Vitals and nursing note reviewed.  Constitutional:      General: He is not in acute distress.    Appearance: He is well-developed. He is obese.  HENT:     Head: Normocephalic and atraumatic.  Eyes:     Conjunctiva/sclera: Conjunctivae normal.  Cardiovascular:     Rate and Rhythm: Normal rate and regular rhythm.     Heart sounds: No murmur heard. Pulmonary:     Effort: Pulmonary effort is normal. No respiratory distress.     Breath sounds: Normal breath sounds.  Abdominal:     Palpations: Abdomen is soft.     Tenderness: There is no abdominal tenderness.  Musculoskeletal:     Cervical back: Neck supple.  Skin:    General: Skin is warm and dry.  Neurological:     Mental Status: He is alert.    ED Results / Procedures / Treatments   Labs (all labs ordered are listed, but only abnormal results are displayed) Labs Reviewed  RESP PANEL BY RT-PCR (FLU A&B, COVID) ARPGX2  BASIC METABOLIC PANEL  CBC WITH DIFFERENTIAL/PLATELET  HEMOGLOBIN A1C  PROTIME-INR  APTT  LIPID PANEL  LIPID PANEL  TROPONIN I (HIGH SENSITIVITY)    EKG EKG Interpretation  Date/Time:  Monday November 26 2020 16:46:16 EDT Ventricular Rate:  93 PR Interval:  154 QRS Duration: 82 QT Interval:  348 QTC Calculation: 432 R Axis:   -20 Text Interpretation: Critical Test Result: STEMI Normal sinus rhythm Anteroseptal infarct , possibly acute Lateral injury pattern ** ** ACUTE MI / STEMI ** ** Abnormal ECG Confirmed by 03-10-1975 [id number]) on 11/27/2020 1:50:19 PM  Radiology DG Chest Portable 1 View  Result Date: 11/26/2020 CLINICAL DATA:  Intermittent chest pain. EXAM: PORTABLE CHEST 1 VIEW COMPARISON:  January 19, 2019 FINDINGS: Low lung volumes are noted with mild areas of atelectasis and/or infiltrate are noted within  the bilateral lung bases. There is no evidence of a pleural effusion or pneumothorax. The heart size and mediastinal contours are within normal limits. The visualized skeletal structures are unremarkable. IMPRESSION: Low lung volumes with mild bibasilar atelectasis and/or infiltrate. Electronically Signed   By: January 21, 2019 M.D.   On: 11/26/2020 17:29   CT Angio Chest/Abd/Pel for Dissection W and/or Wo Contrast  Result Date: 11/26/2020 CLINICAL DATA:  STEMI, widened mediastinum on chest radiographs, concerning for type A dissection EXAM: CT ANGIOGRAPHY CHEST, ABDOMEN AND PELVIS TECHNIQUE: Non-contrast CT of the chest was initially obtained. Multidetector CT imaging through the chest, abdomen and pelvis was performed using the  standard protocol during bolus administration of intravenous contrast. Multiplanar reconstructed images and MIPs were obtained and reviewed to evaluate the vascular anatomy. CONTRAST:  OMNIPAQUE IOHEXOL 350 MG/ML SOLN COMPARISON:  None. FINDINGS: CTA CHEST FINDINGS Cardiovascular: Preferential opacification of the thoracic aorta. 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 The remainder of the vessel is normal in caliber, descending thoracic aorta measuring up to 2.6 x 2.5 cm. No evidence of aortic dissection or other acute aortic pathology. No significant atherosclerosis. Normal heart size. Three-vessel coronary artery calcifications. No pericardial effusion. Mediastinum/Nodes: No enlarged mediastinal, hilar, or axillary lymph nodes. Thyroid gland, trachea, and esophagus demonstrate no significant findings. Lungs/Pleura: Lungs are clear. No pleural effusion or pneumothorax. Musculoskeletal: No chest wall abnormality. No acute or significant osseous findings. Review of the MIP images confirms the above findings. CTA ABDOMEN AND PELVIS FINDINGS VASCULAR Normal contour and caliber of the abdominal aorta. No evidence of aneurysm,  dissection, or other acute aortic pathology. Standard branching pattern of the abdominal aorta with solitary bilateral renal arteries. Review of the MIP images confirms the above findings. NON-VASCULAR Hepatobiliary: No solid liver abnormality is seen. No gallstones, gallbladder wall thickening, or biliary dilatation. Pancreas: Unremarkable. No pancreatic ductal dilatation or surrounding inflammatory changes. Spleen: Normal in size without significant abnormality. Adrenals/Urinary Tract: Adrenal glands are unremarkable. Simple cyst of the midportion of the right kidney. Kidneys are otherwise normal, without renal calculi, solid lesion, or hydronephrosis. Bladder is unremarkable. Stomach/Bowel: Stomach is within normal limits. Appendix appears normal. No evidence of bowel wall thickening, distention, or inflammatory changes. Sigmoid diverticula. Lymphatic: No enlarged abdominal or pelvic lymph nodes. Reproductive: Mild prostatomegaly. Other: Small, fat containing bilateral inguinal hernias. No abdominopelvic ascites. Musculoskeletal: No acute or significant osseous findings. Review of the MIP images confirms the above findings. IMPRESSION: 1. 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 The remainder of the thoracic and abdominal vessel is normal in caliber. Mild mixed atherosclerosis of the abdominal aorta. Recommend annual imaging followup by CTA or MRA. This recommendation follows 2010 ACCF/AHA/AATS/ACR/ASA/SCA/SCAI/SIR/STS/SVM Guidelines for the Diagnosis and Management of Patients with Thoracic Aortic Disease. Circulation. 2010; 121: M094-B096. Aortic aneurysm NOS (ICD10-I71.9) 2. No evidence of aortic dissection or other acute aortic pathology. 3. Coronary artery disease. 4. Mild prostatomegaly. 5. Sigmoid diverticulosis without evidence of diverticulitis. These results were called by telephone at the time of interpretation on 11/26/2020 at 5:45 pm to Dr. Allyson Sabal, who  verbally acknowledged these results. Electronically Signed   By: Lauralyn Primes M.D.   On: 11/26/2020 17:56    Procedures Procedures   Medications Ordered in ED Medications  0.9 %  sodium chloride infusion (1,000 mLs Intravenous New Bag/Given 11/26/20 1659)  aspirin chewable tablet 324 mg (324 mg Oral Given 11/26/20 1655)  heparin injection 4,000 Units (4,000 Units Intravenous Given 11/26/20 1657)  nitroGLYCERIN (NITROSTAT) SL tablet 0.4 mg (0.4 mg Sublingual Given 11/26/20 1702)  fentaNYL (SUBLIMAZE) 100 MCG/2ML injection (75 mcg  Given 11/26/20 1707)    ED Course  I have reviewed the triage vital signs and the nursing notes.  Pertinent labs & imaging results that were available during my care of the patient were reviewed by me and considered in my medical decision making (see chart for details).    MDM Rules/Calculators/A&P                          65 y.o.  male with significant past medical history as above who presents for evaluation of profound back and chest pain.  He is hemodynamically stable.  Nonhypoxic on room air.  EKG obtained in triage is concerning for anterior lateral STEMI.  The Cath Lab was activated.  Patient was given 324 mg of chewable aspirin, a single nitroglycerin sublingual tablet, and heparin bolus.  Due to persistent pain.  He was given 75 mcg of fentanyl as well.  CBC was unremarkable, with remainder of labs pending.  Chest x-ray was obtained and was concerning for widened mediastinum.  An urgent CTA chest/abdomen/pelvis was obtained to rule out type A dissection contributing to the patient's presentation, especially given that the patient continued to complain of profound back and chest pain.  Both CT technician and radiologist were called to facilitate obtaining and interpreting this test in the most expedient manner.  CTA with mild enlargement of the ascending thoracic aorta but no evidence of ongoing dissection.  Patient was subsequently transported to Cath Lab for  further management.  Patient was hemodynamically stable at the time of transfer.   Final Clinical Impression(s) / ED Diagnoses Final diagnoses:  Acute ST elevation myocardial infarction (STEMI) of anterolateral wall (HCC)  CAD S/P DES PCI D1     Rx / DC Orders ED Discharge Orders     None        Holley Dexter, MD 11/27/20 1818    Blane Ohara, MD 11/28/20 0003

## 2020-11-26 NOTE — ED Notes (Signed)
Called to activate a code stemi per dr. Cherre Robins

## 2020-11-26 NOTE — ED Triage Notes (Signed)
Pt arrives with intermittent pain between shoulder blades and BUE. Today developed diffuse chest pain with some shob. Had same presentation a few years ago and was told he had muscle spasms.

## 2020-11-26 NOTE — Progress Notes (Signed)
Chaplain responded to STEMi that was paged out at 5 PM when she came on shift.  Patient was in Trauma room with his wife bedside.  Chaplain provided presence and prayer as husband was transported back and forth to catscan before being taken up to Cath Lab. Wife Clydie Braun says that they have been married for over 40 year.  Three sons, two live nearby and are coming in. The middle child lives at [facility].  Amalia Hailey and Jonny Ruiz will arrive shortly, bringing Clydie Braun dinner.  Wife says husband does not like doctors and has never had an issue before.  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 Chaplain will be available if needed. Rev. Lynnell Chad  Pager [phone]

## 2020-11-26 NOTE — Plan of Care (Signed)
Paged by nursing regarding K 3.6, NSVT following procedure.  Patient chest pain free otherwise well without complaint with robust blood pressure.  40 mEq KCL ordered.  Started on metoprolol 25 mg BID for post MI & NSVT suppression indications.  Luanna Salk, MD Cardiology Fellow [facility]

## 2020-11-26 NOTE — Plan of Care (Signed)

## 2020-11-26 NOTE — H&P (Addendum)
Cardiology H & P:   Patient ID: Jose Edwards MRN: [id number]; DOB: Dec 10, 1955  Admit date: 11/26/2020 Date of Consult: 11/26/2020  PCP:  Karie Schwalbe, MD   [facility] Providers Cardiologist: New  Patient Profile:   Jose Edwards is a 65 y.o. male with no significant past medical history who is being seen 11/26/2020 for the evaluation of STEMI at the request of Dr. Earlyne Iba..  History of Present Illness:   Jose Edwards dealing with intermittent pain with shoulder blade for past couple of weeks.  Occurring with and without exertion.  Today he noted substernal chest pain with shortness of breath and came to ER for further evaluation.  EKG showed anterior septal ST elevation with reciprocal depression in inferior leads.  Code STEMI activated.  Patient received aspirin 324 mg, sublingual nitroglycerin x1 and heparin bolus.  He was also given fentanyl.  Chest x-ray showed widening mediastinum.  Pending CT of chest abdomen pelvis for dissection rule out.  History  Obtained by Dr. Allyson Sabal in acute setting.  He works as a Music therapist at [employer].  Denies tobacco smoking or illicit drug use.   Past Medical History:  Diagnosis Date   Benign positional vertigo    ED (erectile dysfunction)    GERD (gastroesophageal reflux disease)    Osteoarthrosis, unspecified whether generalized or localized, unspecified site     Past Surgical History:  Procedure Laterality Date   ANKLE SURGERY  2008   fracture left ankle ( 2 pins)   ROTATOR CUFF REPAIR  2005   right shoulder     Inpatient Medications: Scheduled Meds:  Continuous Infusions:  sodium chloride 1,000 mL (11/26/20 1659)   PRN Meds:   Allergies:    Allergies  Allergen Reactions   Gabapentin Other (See Comments)    Causes aggressive feelings   Pseudoephedrine Other (See Comments)    Makes the patient drowsy    Social History:   Social History   Socioeconomic History   Marital status: Married     Spouse name: Not on file   Number of children: 3   Years of education: 12   Highest education level: Not on file  Occupational History   Occupation: CARPENTER    Employer: [employer]  Tobacco Use   Smoking status: Never   Smokeless tobacco: Never  Vaping Use   Vaping Use: Never used  Substance and Sexual Activity   Alcohol use: Yes    Comment: Beer socially (once weekly)   Drug use: No   Sexual activity: Not on file  Other Topics Concern   Not on file  Social History Narrative   Lives with wife in a one story home.  Has 3 sons.  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  Education: high school.   Social Determinants of Health   Financial Resource Strain: Not on file  Food Insecurity: Not on file  Transportation Needs: Not on file  Physical Activity: Not on file  Stress: Not on file  Social Connections: Not on file  Intimate Partner Violence: Not on file    Family History:    Family History  Problem Relation Age of Onset   Cancer Mother    Diabetes Sister    Diabetes Paternal Grandmother    Cancer Father    Coronary artery disease Neg Hx    Hypertension Neg Hx      ROS:  Please see the history of present illness.  All other ROS reviewed and negative.  Physical Exam/Data:   Vitals:   11/26/20 1700 11/26/20 1705 11/26/20 1710 11/26/20 1715  BP:  (!) 174/100 (!) 160/95 (!) 165/94  Pulse:  (!) 108 (!) 102 98  Resp:  17 15 16   Temp:      TempSrc:      SpO2:  99% 97% 97%  Weight: 99.8 kg     Height: 5\' 7"  (1.702 m)      No intake or output data in the 24 hours ending 11/26/20 1737 Last 3 Weights 11/26/2020 11/26/2020 09/04/2020  Weight (lbs) 220 lb 220 lb 233 lb 3.2 oz  Weight (kg) 99.791 kg 99.791 kg 105.779 kg     Body mass index is 34.46 kg/m.  General:  Well nourished, well developed, in no acute distress HEENT: normal Lymph: no adenopathy Neck: no JVD Endocrine:  No thryomegaly Vascular: No carotid bruits; FA pulses 2+ bilaterally  without bruits  Cardiac:  normal S1, S2; RRR; no murmur  Lungs:  clear to auscultation bilaterally, no wheezing, rhonchi or rales  Abd: soft, nontender, no hepatomegaly  Ext: no edema Musculoskeletal:  No deformities, BUE and BLE strength normal and equal Skin: warm and dry  Neuro:  CNs 2-12 intact, no focal abnormalities noted Psych:  Normal affect   EKG:  The EKG was personally reviewed and demonstrates: Sinus rhythm, anterior septal ST elevation, depression in inferior leads Telemetry:  Telemetry was personally reviewed and demonstrates:  N/A  Relevant CV Studies: N/A  Laboratory Data:  Hematology Recent Labs  Lab 11/26/20 1639  WBC 5.7  RBC 4.58  HGB 13.9  HCT 40.4  MCV 88.2  MCH 30.3  MCHC 34.4  RDW 12.7  PLT 224    Radiology/Studies:  DG Chest Portable 1 View  Result Date: 11/26/2020 CLINICAL DATA:  Intermittent chest pain. EXAM: PORTABLE CHEST 1 VIEW COMPARISON:  January 19, 2019 FINDINGS: Low lung volumes are noted with mild areas of atelectasis and/or infiltrate are noted within the bilateral lung bases. There is no evidence of a pleural effusion or pneumothorax. The heart size and mediastinal contours are within normal limits. The visualized skeletal structures are unremarkable. IMPRESSION: Low lung volumes with mild bibasilar atelectasis and/or infiltrate. Electronically Signed   By: 11/28/2020 M.D.   On: 11/26/2020 17:29     Assessment and Plan:   STEMI - No prior cardiac hx. Few weeks hx of intermittent pain between shoulder blade. Today around 9 am he started to having substernal chest pressure and SOB. EKG showed anterior septal ST elevation with reciprocal depression in inferior leads. CT angio of chest/abd/pelvis without dissection. Pending emergent cath.    Risk Assessment/Risk Scores:   TIMI Risk Score for ST  Elevation MI:   The patient's TIMI risk score is 3, which indicates a 4.4% risk of all cause mortality   For questions or updates,  please contact [facility] Please consult [email or web address] for contact info under    Aram Candela, 11/28/2020  11/26/2020 5:37 PM   Agree with note by [place] PA-C  Jose Edwards is a 65 year old moderately overweight married Caucasian male who presented with an anterolateral ST segment elevation myocardial infarction late this afternoon.  His pain began around 9:00 this morning.  He has no cardiac risk factors.  He was hemodynamically stable.  He was brought urgently to the Cath Lab after a chest CTA ruled out dissection because of "widened mediastinum on portable chest x-ray".  Okey Dupre, M.D., FACP, [facility], [facility] [address] [place], [place]  [address]  [phone] 11/26/2020 6:55 PM

## 2020-11-26 NOTE — ED Notes (Signed)
Pt transported to cath lab w/ RN

## 2020-11-27 ENCOUNTER — Inpatient Hospital Stay (HOSPITAL_COMMUNITY): Payer: BC Managed Care – PPO

## 2020-11-27 ENCOUNTER — Encounter (HOSPITAL_COMMUNITY): Payer: Self-pay | Admitting: Cardiovascular Disease

## 2020-11-27 ENCOUNTER — Other Ambulatory Visit (HOSPITAL_COMMUNITY): Payer: Self-pay

## 2020-11-27 ENCOUNTER — Ambulatory Visit: Payer: BC Managed Care – PPO | Admitting: Nurse Practitioner

## 2020-11-27 DIAGNOSIS — I2510 Atherosclerotic heart disease of native coronary artery without angina pectoris: Secondary | ICD-10-CM

## 2020-11-27 DIAGNOSIS — E785 Hyperlipidemia, unspecified: Secondary | ICD-10-CM | POA: Diagnosis not present

## 2020-11-27 DIAGNOSIS — Z9861 Coronary angioplasty status: Secondary | ICD-10-CM

## 2020-11-27 DIAGNOSIS — I2109 ST elevation (STEMI) myocardial infarction involving other coronary artery of anterior wall: Secondary | ICD-10-CM | POA: Diagnosis not present

## 2020-11-27 DIAGNOSIS — I25119 Atherosclerotic heart disease of native coronary artery with unspecified angina pectoris: Secondary | ICD-10-CM

## 2020-11-27 HISTORY — DX: Atherosclerotic heart disease of native coronary artery without angina pectoris: I25.10

## 2020-11-27 LAB — BASIC METABOLIC PANEL
Anion gap: 9 (ref 5–15)
BUN: 11 mg/dL (ref 8–23)
CO2: 21 mmol/L — ABNORMAL LOW (ref 22–32)
Calcium: 8.8 mg/dL — ABNORMAL LOW (ref 8.9–10.3)
Chloride: 107 mmol/L (ref 98–111)
Creatinine, Ser: 0.83 mg/dL (ref 0.61–1.24)
GFR, Estimated: >60 mL/min (ref >60)
Glucose, Bld: 127 mg/dL — ABNORMAL HIGH (ref 70–99)
Potassium: 4 mmol/L (ref 3.5–5.1)
Sodium: 137 mmol/L (ref 135–145)

## 2020-11-27 LAB — CBC
HCT: 40.6 % (ref 39.0–52.0)
Hemoglobin: 13.5 g/dL (ref 13.0–17.0)
MCH: 29.8 pg (ref 26.0–34.0)
MCHC: 33.3 g/dL (ref 30.0–36.0)
MCV: 89.6 fL (ref 80.0–100.0)
Platelets: 231 10*3/uL (ref 150–400)
RBC: 4.53 MIL/uL (ref 4.22–5.81)
RDW: 12.7 % (ref 11.5–15.5)
WBC: 9.1 10*3/uL (ref 4.0–10.5)
nRBC: 0 % (ref 0.0–0.2)

## 2020-11-27 LAB — ECHOCARDIOGRAM COMPLETE
Area-P 1/2: 4.46 cm2
Height: 67 in
S' Lateral: 3.5 cm
Weight: 3657.87 oz

## 2020-11-27 LAB — RESP PANEL BY RT-PCR (FLU A&B, COVID) ARPGX2
Influenza A by PCR: NEGATIVE
Influenza B by PCR: NEGATIVE
SARS Coronavirus 2 by RT PCR: NEGATIVE

## 2020-11-27 LAB — GLUCOSE, CAPILLARY
Glucose-Capillary: 142 mg/dL — ABNORMAL HIGH (ref 70–99)
Glucose-Capillary: 133 mg/dL — ABNORMAL HIGH (ref 70–99)
Glucose-Capillary: 125 mg/dL — ABNORMAL HIGH (ref 70–99)

## 2020-11-27 MED ORDER — ENOXAPARIN SODIUM 40 MG/0.4ML IJ SOSY
40.0000 mg | PREFILLED_SYRINGE | INTRAMUSCULAR | Status: DC
  Administered 2020-11-27 – 2020-11-28 (×2): 40 mg via SUBCUTANEOUS
  Filled 2020-11-27 (×2): qty 0.4

## 2020-11-27 NOTE — Discharge Instructions (Signed)

## 2020-11-27 NOTE — TOC Benefit Eligibility Note (Signed)
Patient Product/process development scientist completed.    The patient is currently admitted and upon discharge could be taking Brilinta 90 mg.  The current 30 day co-pay is, $35.00.   The patient is insured through [employer] [place] Commercial [employer]     Roland Earl, CPhT Pharmacy Patient Advocate Specialist [facility] Antimicrobial Stewardship Team Direct Number: [phone]  Fax: [phone]

## 2020-11-27 NOTE — Progress Notes (Signed)
CARDIAC REHAB PHASE I   PRE:  Rate/Rhythm: 72 SR    BP: sitting 135/79    SaO2:   MODE:  Ambulation: 370 ft   POST:  Rate/Rhythm: 92 SR    BP: sitting 135/77     SaO2:   Tolerated well. Feels good. Discussed MI, stent, restrictions, Brilinta, diet, exercise, NTG and CRPII. Pt and wife receptive. Will refer to [facility] CRPII. He understands the importance of Brilinta. [id number]   Harriet Masson CES, ACSM 11/27/2020 2:41 PM

## 2020-11-27 NOTE — Progress Notes (Signed)
Pt received from 2H. VSS. Pt and family oriented to room and unit. Call light in reach.  Versie Starks, RN

## 2020-11-27 NOTE — Progress Notes (Signed)
  Echocardiogram 2D Echocardiogram has been performed.  Pieter Partridge 11/27/2020, 9:31 AM

## 2020-11-27 NOTE — Progress Notes (Signed)
Progress Note  Patient Name: Moustapha Tooker Date of Encounter: 11/27/2020  [facility] Cardiologist: None Quay Burow  Patient Profile     65 y.o. obese male with no significant PMH who presented late afternoon 11/26/2020 with anterolateral ST elevation MI-found to have dilated mediastinum on chest x-ray (with only mild enlargement of the ascending thoracic aorta noted on CT scan 4.1 x 4.0 cm) after these results are found, he was taken directly to cardiac catheterization lab where he is found to have an occluded first diagonal branch treated with a DES stent.   Assessment & Plan    Principal Problem:   Acute ST elevation myocardial infarction (STEMI) of anterolateral wall (HCC) Active Problems:   Coronary artery disease involving native coronary artery of native heart with angina pectoris (HCC)   CAD S/P DES PCI D1    Hyperlipidemia with target LDL less than 70   Principal Problem:   Acute ST elevation myocardial infarction (STEMI) of anterolateral wall (HCC) => Coronary artery disease involving native coronary artery of native heart with angina pectoris (HCC) => CAD S/P DES PCI D1 ; troponin peaked at 24,000.  Echo pending DES PCI of the LAD.  On DAPT: ASA/Brilinta. Initially hypertensive and tachycardic, now blood pressures/heart rate seem to be stabilizing -> Started on low-dose beta-blocker (Lopressor 25 mg twice daily) NSVT overnight, monitor for stabilization. High-dose high intensity statin -  80 mg atorvastatin started [facility] consult as inpatient, will recommend [place] to consult as outpatient.   Echocardiogram pending     Hyperlipidemia with target LDL less than 70 -> started on atorvastatin 80 mg.  Dispo: Based on branch vessel occlusion with successful PCI, pending echocardiogram assessment, anticipate transfer to telemetry today with potential discharge in the morning as fast track discharge.   Subjective   No further chest pain.  No dyspnea.  No sense of  palpitations.  Inpatient Medications    Scheduled Meds:  aspirin EC  81 mg Oral Daily   atorvastatin  80 mg Oral Daily   Chlorhexidine Gluconate Cloth  6 each Topical Daily   enoxaparin (LOVENOX) injection  40 mg Subcutaneous Q24H   metoprolol tartrate  25 mg Oral BID   sodium chloride flush  3 mL Intravenous Q12H   ticagrelor  90 mg Oral BID   Continuous Infusions:  sodium chloride 1,000 mL (11/26/20 1659)   sodium chloride     PRN Meds: sodium chloride, acetaminophen, morphine injection, ondansetron (ZOFRAN) IV, sodium chloride flush   Vital Signs    Vitals:   11/27/20 0600 11/27/20 0630 11/27/20 0700 11/27/20 0817  BP: 125/60 116/71 131/80   Pulse: 67 69 73   Resp: _0 Temp:    97.9 F (36.6 C)  TempSrc:    Oral  SpO2: 96% 95% 97%   Weight:      Height:        Intake/Output Summary (Last 24 hours) at 11/27/2020 0840 Last data filed at 11/27/2020 0817 Gross per 24 hour  Intake 3111.88 ml  Output 1325 ml  Net 1786.88 ml   Last 3 Weights 11/27/2020 11/26/2020 11/26/2020  Weight (lbs) 228 lb 9.9 oz 220 lb 220 lb  Weight (kg) 103.7 kg 99.791 kg 99.791 kg      Telemetry    Sinus rhythm, originally in the 90-100 range, now down into the 70s.  Has had at least 2 if not 3 runs of 10-15 beats nonsustained V. tach overnight. - Personally Reviewed  ECG    Sinus rhythm, rate 76 bpm.  Q waves noted in aVL, V2 V3.  Read as anterolateral infarct, age undetermined.  More consistent with recent as there is subtle ST elevations remaining in 1 and aVL.- Personally Reviewed  Physical Exam   GEN: No acute distress.  Resting comfortably bed.  Mildly obese. Neck: No JVD, no bruit Cardiac: Normal S1 and S2 RRR, no murmurs, rubs, or gallops.  Respiratory: Clear to auscultation bilaterally.  Nonlabored, good air movement. GI: Soft, nontender, non-distended  MS: No edema; No deformity.  Radial cath site clean dry and intact.  Minimal bruising. Neuro:  Nonfocal  Psych: Normal  affect   Labs    High Sensitivity Troponin:   Recent Labs  Lab 11/26/20 1639 11/26/20 1918  TROPONINIHS 79* >24,000*      Chemistry Recent Labs  Lab 11/26/20 1639 11/27/20 0030  NA 137 137  K 3.6 4.0  CL 106 107  CO2 22 21*  GLUCOSE 138* 127*  BUN 12 11  CREATININE 1.01 0.83  CALCIUM 9.3 8.8*  GFRNONAA >60 >60  ANIONGAP 9 9     Hematology Recent Labs  Lab 11/26/20 1639 11/27/20 0030  WBC 5.7 9.1  RBC 4.58 4.53  HGB 13.9 13.5  HCT 40.4 40.6  MCV 88.2 89.6  MCH 30.3 29.8  MCHC 34.4 33.3  RDW 12.7 12.7  PLT 224 231    BNPNo results for input(s): BNP, PROBNP in the last 168 hours.   DDimer No results for input(s): DDIMER in the last 168 hours.   Radiology    CARDIAC CATHETERIZATION  Addendum Date: 11/26/2020     Mid LM lesion is 30% stenosed.   Ost LAD to Prox LAD lesion is 30% stenosed.   Ost LM lesion is 30% stenosed.   Ramus lesion is 30% stenosed.   1st Diag lesion is 100% stenosed.   A drug-eluting stent was successfully placed using a STENT ONYX FRONTIER 2.25X18.   Post intervention, there is a 0% residual stenosis. Evertt Chouinard is a 65 y.o. male  [id number] LOCATION:  FACILITY: [facility] PHYSICIAN: Quay Burow, M.D. [birth year] DATE OF PROCEDURE:  11/26/2020 DATE OF DISCHARGE: CARDIAC CATHETERIZATION / PCI DES Diag # 1 History obtained from chart review.  Mr. 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  He has no cardiac risk factors and no prior cardiac history.  He developed chest and back pain this morning which is off and on throughout the day until he presented to [facility] emergency room where he was found to have anterolateral ST segment elevation with with reciprocal inferior depression.  He did have a widened mediastinum on portable chest x-ray which led to a stat CTA to rule out dissection which prolonged the time to get to the Cath Lab and thus the door to  balloon time.  After the CT scan showed no dissection he was brought urgently to the Cath Lab for angiography and intervention. PROCEDURE DESCRIPTION: The patient was brought to the second floor [facility] cath lab in the postabsorptive state. He was not premedicated . His right wrist was prepped and shaved in usual sterile fashion. Xylocaine 1% was used for local anesthesia. A 6 French sheath was inserted into the right radial  artery using standard Seldinger technique. The patient received 3000 units  of heparin intravenously.  A 5 [place] TIG catheter and pigtail catheters were used for selective  coronary angiography, and obtain left heart pressures.  Isovue dye was used for the entirety of the case (140 cc total administered to the patient).  Retrograde aortic, ventricular and pullback pressures were recorded.  Radial cocktail was administered via the SideArm sheath. Patient received a total of 11,000 use of heparin with an ACT over 300.  He received Brilinta 180 mg p.o. load.  Isovue dye is used for the entirety of the intervention.  Retroaortic pressures monitored during the case. Using a 6 [place] XB LAD 3.5 cm guide catheter along with a 0.14 Prowater guidewire and a 2 mm x 12 mm balloon the diagonal branch was easily crossed and dilated establishing antegrade flow.  I then stented the proximal diagonal branch with a 2.25 mm x 18 mm long Medtronic frontier drug-eluting stent at 18 atm (2.43 mm) resulting reduction with total occlusion to 0% residual TIMI-3 flow.  This turned out to be a fairly large diagonal branch.  With restoration of antegrade flow the patient's chest and back pain resolved and his EKG normalized. IMPRESSION: Successful first diagonal branch PCI drug-eluting stenting in the setting of anterolateral STEMI with a prolonged sort of balloon time because of needing to perform a chest CTA to rule out dissection.  The remainder of his coronary anatomy was free of significant disease.  A 2D  echo will be ordered.  He will need to be on dual antiplatelet therapy uninterrupted for at least 12 months.  The radial sheath was removed and a TR band was placed on the right wrist to achieve patent hemostasis.  The patient left lab in stable condition.  I anticipate discharge in 48 hours. Quay Burow. MD, [facility] 11/26/2020 6:50 PM Addendum: After careful review of the post intervention angiogram there appeared to be a faint filling defect in the diagonal branch beyond the stented segment. This suggested thrombus. Based on this I elected to begin a short course of IV aggrastat. Lorretta Harp, M.D., [place], [facility], Laverta [place] Walker [address] [place], [place]  [phone] 11/26/2020 8:52 PM   Result Date: 11/26/2020 Images from the original result were not included.   Mid LM lesion is 30% stenosed.   Ost LAD to Prox LAD lesion is 30% stenosed.   Ost LM lesion is 30% stenosed.   Ramus lesion is 30% stenosed.   1st Diag lesion is 100% stenosed.   A drug-eluting stent was successfully placed using a STENT ONYX FRONTIER 2.25X18.   Post intervention, there is a 0% residual stenosis. Philo Kurtz is a 65 y.o. male  [id number] LOCATION:  FACILITY: [place] PHYSICIAN: Quay Burow, M.D. 1955/04/04 DATE OF PROCEDURE:  11/26/2020 DATE OF DISCHARGE: CARDIAC CATHETERIZATION / PCI DES Diag # 1 History obtained from chart review.  Mr. 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  He has no cardiac risk factors and no prior cardiac history.  He developed chest and back pain this morning which is off and on throughout the day until he presented to [facility] emergency room where he was found to have anterolateral ST segment elevation with with reciprocal inferior depression.  He did have a widened mediastinum on portable chest x-ray which led to a stat CTA to rule out dissection which  prolonged the time to get to the Cath Lab and thus the door to balloon time.  After the CT scan showed no dissection he  was brought urgently to the Cath Lab for angiography and intervention. PROCEDURE DESCRIPTION: The patient was brought to the second floor [facility] cath lab in the postabsorptive state. He was not premedicated . His right wrist was prepped and shaved in usual sterile fashion. Xylocaine 1% was used for local anesthesia. A 6 French sheath was inserted into the right radial  artery using standard Seldinger technique. The patient received 3000 units  of heparin intravenously.  A 5 [place] TIG catheter and pigtail catheters were used for selective coronary angiography, and obtain left heart pressures.  Isovue dye was used for the entirety of the case (140 cc total administered to the patient).  Retrograde aortic, ventricular and pullback pressures were recorded.  Radial cocktail was administered via the SideArm sheath. Patient received a total of 11,000 use of heparin with an ACT over 300.  He received Brilinta 180 mg p.o. load.  Isovue dye is used for the entirety of the intervention.  Retroaortic pressures monitored during the case. Using a 6 [place] XB LAD 3.5 cm guide catheter along with a 0.14 Prowater guidewire and a 2 mm x 12 mm balloon the diagonal branch was easily crossed and dilated establishing antegrade flow.  I then stented the proximal diagonal branch with a 2.25 mm x 18 mm long Medtronic frontier drug-eluting stent at 18 atm (2.43 mm) resulting reduction with total occlusion to 0% residual TIMI-3 flow.  This turned out to be a fairly large diagonal branch.  With restoration of antegrade flow the patient's chest and back pain resolved and his EKG normalized.   Successful first diagonal branch PCI drug-eluting stenting in the setting of anterolateral STEMI with a prolonged sort of balloon time because of needing to perform a chest CTA to rule out dissection.  The remainder of his  coronary anatomy was free of significant disease.  A 2D echo will be ordered.  He will need to be on dual antiplatelet therapy uninterrupted for at least 12 months.  The radial sheath was removed and a TR band was placed on the right wrist to achieve patent hemostasis.  The patient left lab in stable condition.  I anticipate discharge in 48 hours. Quay Burow. MD, [facility] 11/26/2020 6:50 PM    DG Chest Portable 1 View  Result Date: 11/26/2020 CLINICAL DATA:  Intermittent chest pain. EXAM: PORTABLE CHEST 1 VIEW COMPARISON:  January 19, 2019 FINDINGS: Low lung volumes are noted with mild areas of atelectasis and/or infiltrate are noted within the bilateral lung bases. There is no evidence of a pleural effusion or pneumothorax. The heart size and mediastinal contours are within normal limits. The visualized skeletal structures are unremarkable. IMPRESSION: Low lung volumes with mild bibasilar atelectasis and/or infiltrate. Electronically Signed   By: Virgina Norfolk M.D.   On: 11/26/2020 17:29   CT Angio Chest/Abd/Pel for Dissection W and/or Wo Contrast  Result Date: 11/26/2020 CLINICAL DATA:  STEMI, widened mediastinum on chest radiographs, concerning for type A dissection EXAM: CT ANGIOGRAPHY CHEST, ABDOMEN AND PELVIS TECHNIQUE: Non-contrast CT of the chest was initially obtained. Multidetector CT imaging through the chest, abdomen and pelvis was performed using the standard protocol during bolus administration of intravenous contrast. Multiplanar reconstructed images and MIPs were obtained and reviewed to evaluate the vascular anatomy. CONTRAST:  163m OMNIPAQUE IOHEXOL 350 MG/ML SOLN COMPARISON:  None. FINDINGS: CTA CHEST FINDINGS Cardiovascular: Preferential opacification of the thoracic aorta. 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 The  remainder of the vessel is normal in caliber, descending thoracic aorta measuring up to 2.6 x 2.5 cm. No evidence of  aortic dissection or other acute aortic pathology. No significant atherosclerosis. Normal heart size. Three-vessel coronary artery calcifications. No pericardial effusion. Mediastinum/Nodes: No enlarged mediastinal, hilar, or axillary lymph nodes. Thyroid gland, trachea, and esophagus demonstrate no significant findings. Lungs/Pleura: Lungs are clear. No pleural effusion or pneumothorax. Musculoskeletal: No chest wall abnormality. No acute or significant osseous findings. Review of the MIP images confirms the above findings. CTA ABDOMEN AND PELVIS FINDINGS VASCULAR Normal contour and caliber of the abdominal aorta. No evidence of aneurysm, dissection, or other acute aortic pathology. Standard branching pattern of the abdominal aorta with solitary bilateral renal arteries. Review of the MIP images confirms the above findings. NON-VASCULAR Hepatobiliary: No solid liver abnormality is seen. No gallstones, gallbladder wall thickening, or biliary dilatation. Pancreas: Unremarkable. No pancreatic ductal dilatation or surrounding inflammatory changes. Spleen: Normal in size without significant abnormality. Adrenals/Urinary Tract: Adrenal glands are unremarkable. Simple cyst of the midportion of the right kidney. Kidneys are otherwise normal, without renal calculi, solid lesion, or hydronephrosis. Bladder is unremarkable. Stomach/Bowel: Stomach is within normal limits. Appendix appears normal. No evidence of bowel wall thickening, distention, or inflammatory changes. Sigmoid diverticula. Lymphatic: No enlarged abdominal or pelvic lymph nodes. Reproductive: Mild prostatomegaly. Other: Small, fat containing bilateral inguinal hernias. No abdominopelvic ascites. Musculoskeletal: No acute or significant osseous findings. Review of the MIP images confirms the above findings. IMPRESSION: 1. 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 The remainder of the thoracic and abdominal vessel is  normal in caliber. Mild mixed atherosclerosis of the abdominal aorta. Recommend annual imaging followup by CTA or MRA. This recommendation follows 2010 ACCF/AHA/AATS/ACR/ASA/SCA/SCAI/SIR/STS/SVM Guidelines for the Diagnosis and Management of Patients with Thoracic Aortic Disease. Circulation. 2010; 121: Q867-T198. Aortic aneurysm NOS (ICD10-I71.9) 2. No evidence of aortic dissection or other acute aortic pathology. 3. Coronary artery disease. 4. Mild prostatomegaly. 5. Sigmoid diverticulosis without evidence of diverticulitis. These results were called by telephone at the time of interpretation on 11/26/2020 at 5:45 pm to Dr. Gwenlyn Found, who verbally acknowledged these results. Electronically Signed   By: Eddie Candle M.D.   On: 11/26/2020 17:56    Cardiac Studies   Left Heart Cath-PCI 11/26/20: Culprit lesion-100% D1 (DES PCI with Onyx Frontier DES 2.25 x 18 - deployed to 2.4 mm). Mid LM 30%, Ost-prox LAD 30% & RI 30%.  (Images personally reviewed.  I was present during the PCI). TTE pending  For questions or updates, please contact [place] Please consult [email or web address] for contact info under        Signed, Glenetta Hew, MD  11/27/2020, 8:40 AM

## 2020-11-28 ENCOUNTER — Telehealth: Payer: Self-pay | Admitting: *Deleted

## 2020-11-28 ENCOUNTER — Other Ambulatory Visit (HOSPITAL_COMMUNITY): Payer: Self-pay

## 2020-11-28 DIAGNOSIS — I10 Essential (primary) hypertension: Secondary | ICD-10-CM | POA: Clinically undetermined

## 2020-11-28 DIAGNOSIS — I2109 ST elevation (STEMI) myocardial infarction involving other coronary artery of anterior wall: Secondary | ICD-10-CM | POA: Diagnosis not present

## 2020-11-28 DIAGNOSIS — I25119 Atherosclerotic heart disease of native coronary artery with unspecified angina pectoris: Secondary | ICD-10-CM | POA: Diagnosis not present

## 2020-11-28 DIAGNOSIS — I2510 Atherosclerotic heart disease of native coronary artery without angina pectoris: Secondary | ICD-10-CM | POA: Diagnosis not present

## 2020-11-28 DIAGNOSIS — I255 Ischemic cardiomyopathy: Secondary | ICD-10-CM | POA: Clinically undetermined

## 2020-11-28 DIAGNOSIS — E785 Hyperlipidemia, unspecified: Secondary | ICD-10-CM | POA: Diagnosis not present

## 2020-11-28 MED ORDER — DAPAGLIFLOZIN PROPANEDIOL 10 MG PO TABS
10.0000 mg | ORAL_TABLET | Freq: Every day | ORAL | 11 refills | Status: DC
  Filled 2020-11-28: qty 30, 30d supply, fill #0

## 2020-11-28 MED ORDER — METOPROLOL SUCCINATE ER 50 MG PO TB24
50.0000 mg | ORAL_TABLET | Freq: Every day | ORAL | Status: DC
  Administered 2020-11-28: 50 mg via ORAL
  Filled 2020-11-28: qty 1

## 2020-11-28 MED ORDER — ATORVASTATIN CALCIUM 80 MG PO TABS
80.0000 mg | ORAL_TABLET | Freq: Every day | ORAL | 3 refills | Status: DC
  Filled 2020-11-28: qty 90, 90d supply, fill #0

## 2020-11-28 MED ORDER — METOPROLOL SUCCINATE ER 50 MG PO TB24
50.0000 mg | ORAL_TABLET | Freq: Every day | ORAL | 11 refills | Status: DC
  Filled 2020-11-28: qty 30, 30d supply, fill #0

## 2020-11-28 MED ORDER — TICAGRELOR 90 MG PO TABS
90.0000 mg | ORAL_TABLET | Freq: Two times a day (BID) | ORAL | 11 refills | Status: DC
  Filled 2020-11-28: qty 60, 30d supply, fill #0

## 2020-11-28 MED ORDER — ASPIRIN 81 MG PO TBEC
81.0000 mg | DELAYED_RELEASE_TABLET | Freq: Every day | ORAL | 3 refills | Status: DC
  Filled 2020-11-28: qty 90, 90d supply, fill #0

## 2020-11-28 MED ORDER — NITROGLYCERIN 0.4 MG SL SUBL
0.4000 mg | SUBLINGUAL_TABLET | SUBLINGUAL | 1 refills | Status: DC | PRN
  Filled 2020-11-28: qty 25, 8d supply, fill #0

## 2020-11-28 MED ORDER — LOSARTAN POTASSIUM 25 MG PO TABS
25.0000 mg | ORAL_TABLET | Freq: Every day | ORAL | 11 refills | Status: DC
  Filled 2020-11-28: qty 30, 30d supply, fill #0

## 2020-11-28 MED ORDER — LOSARTAN POTASSIUM 25 MG PO TABS
25.0000 mg | ORAL_TABLET | Freq: Every day | ORAL | Status: DC
  Administered 2020-11-28: 25 mg via ORAL
  Filled 2020-11-28: qty 1

## 2020-11-28 NOTE — Telephone Encounter (Signed)
-----   Message from Arty Baumgartner, NP sent at 11/28/2020 10:52 AM EDT ----- Regarding: TOC callback Needs TOC call, thx!

## 2020-11-28 NOTE — Telephone Encounter (Signed)
Next Appt With Cardiology Azalee Course, PA)12/11/2020 at  8:45 AM

## 2020-11-28 NOTE — Telephone Encounter (Signed)
Left message for patient to call back  

## 2020-11-28 NOTE — Discharge Summary (Addendum)
Discharge Summary    Patient ID: Mccormick Macon MRN: [id number]; DOB: 03-18-1956  Admit date: 11/26/2020 Discharge date: 11/28/2020  PCP:  Venia Carbon, MD   [facility] Providers Cardiologist:  Quay Burow, MD    Discharge Diagnoses    Principal Problem:   Acute ST elevation myocardial infarction (STEMI) of anterolateral wall (Centre) Active Problems:   Obesity (BMI 30-39.9)   Coronary artery disease involving native coronary artery of native heart with angina pectoris (HCC)   CAD S/P DES PCI D1    Hyperlipidemia with target LDL less than 70   Ischemic cardiomyopathy - EF 40-45% post Ant-Lat STEMI   Essential hypertension    Diagnostic Studies/Procedures    Cath: 11/26/20  Mid LM lesion is 30% stenosed.   Ost LAD to Prox LAD lesion is 30% stenosed.   Ost LM lesion is 30% stenosed.   Ramus lesion is 30% stenosed.   1st Diag lesion is 100% stenosed.   A drug-eluting stent was successfully placed using a STENT ONYX FRONTIER 2.25X18.   Post intervention, there is a 0% residual stenosis.  IMPRESSION: Successful first diagonal branch PCI drug-eluting stenting in the setting of anterolateral STEMI with a prolonged sort of balloon time because of needing to perform a chest CTA to rule out dissection.  The remainder of his coronary anatomy was free of significant disease.  A 2D echo will be ordered.  He will need to be on dual antiplatelet therapy uninterrupted for at least 12 months.  The radial sheath was removed and a TR band was placed on the right wrist to achieve patent hemostasis.  The patient left lab in stable condition.  I anticipate discharge in 48 hours.  Addendum: After careful review of the post intervention angiogram there appeared to be a faint filling defect in the diagonal branch beyond the stented segment. This suggested thrombus. Based on this I elected to begin a short course of IV aggrastat.   Lorretta Harp, M.D., [place], [facility], [place]  [place] [address] [place],   [id number]          [phone] 11/26/2020 8:52 PM   Diagnostic Dominance: Right Intervention     _____________   History of Present Illness     Shadman Ryott Rafferty is a 65 y.o. male with no significant PMH who presented with STEMI. Mr. Kaufhold dealing with intermittent pain with shoulder blade for past couple of weeks.  Occurring with and without exertion.  The day of admission he noted substernal chest pain with shortness of breath and came to ER for further evaluation.  EKG showed anterior septal ST elevation with reciprocal depression in inferior leads.  Code STEMI activated.  Patient received aspirin 324 mg, sublingual nitroglycerin x1 and heparin bolus.  He was also given fentanyl.  Chest x-ray showed widening mediastinum. He works as a Games developer at [employer].  Denies tobacco smoking or illicit drug use. He was brought to the cath lab for emergent cardiac cath.   Hospital Course     STEMI: underwent cardiac cath noted above with successful 1st diag branch PCI/DES x1. There was felt to be a faint filling defect in the diag branch beyond stented segment. He was continued on IV aggrastat post cath for several hours. Placed on DAPT with ASA/Brilinta for at least one year. No recurrent chest pain. Seen by CR.  -- continue ASA/Brilinta, statin, BB, ARB  ICM: EF 40-45% with moderate hypokinesis of the  LV and apical anterior wall. No issues with volume overload during admission. Started on Toprol titrated to 12m daily as well as losartan 272mdaily.  -- added farxgia at discharge   HLD: LDL 134 -- now on high dose statin -- FLP/LFTs in 8 weeks  HTN: continue on Toprol and losartan  General: Well developed, well nourished, male appearing in no acute distress. Head: Normocephalic, atraumatic.  Neck: Supple without bruits, JVD. Lungs:  Resp regular and unlabored, CTA. Heart: RRR, S1, S2, no  S3, S4, or murmur; no rub. Abdomen: Soft, non-tender, non-distended with normoactive bowel sounds. No hepatomegaly. No rebound/guarding. No obvious abdominal masses. Extremities: No clubbing, cyanosis, edema. Distal pedal pulses are 2+ bilaterally. Right radial cath site stable without bruising or hematoma Neuro: Alert and oriented X 3. Moves all extremities spontaneously. Psych: Normal affect.  Patient was seen by Dr. HaEllyn Hacknd deemed stable for discharge. Follow up in the office has been arranged. [facility] Educated by pharmD prior to discharge.   Did the patient have an acute coronary syndrome (MI, NSTEMI, STEMI, etc) this admission?:  Yes                               AHA/ACC Clinical Performance & Quality Measures: Aspirin prescribed? - Yes ADP Receptor Inhibitor (Plavix/Clopidogrel, Brilinta/Ticagrelor or Effient/Prasugrel) prescribed (includes medically managed patients)? - Yes Beta Blocker prescribed? - Yes High Intensity Statin (Lipitor 40-8041mr Crestor 20-66m104mrescribed? - Yes EF assessed during THIS hospitalization? - Yes For EF <40%, was ACEI/ARB prescribed? - Yes For EF <40%, Aldosterone Antagonist (Spironolactone or Eplerenone) prescribed? - Not Applicable (EF >/= 40%)47%rdiac Rehab Phase II ordered (including medically managed patients)? - Yes      _____________  Discharge Vitals Blood pressure 124/65, pulse 75, temperature 98.2 F (36.8 C), temperature source Oral, resp. rate 12, height 5' 7" (1.702 m), weight 103.7 kg, SpO2 96 %.  Filed Weights   11/26/20 1641 11/26/20 1700 11/27/20 0528  Weight: 99.8 kg 99.8 kg 103.7 kg    Labs & Radiologic Studies    CBC Recent Labs    11/26/20 1639 11/27/20 0030  WBC 5.7 9.1  NEUTROABS 4.2  --   HGB 13.9 13.5  HCT 40.4 40.6  MCV 88.2 89.6  PLT 224 231 096asic Metabolic Panel Recent Labs    11/26/20 1639 11/27/20 0030  NA 137 137  K 3.6 4.0  CL 106 107  CO2 22 21*  GLUCOSE 138*  127*  BUN 12 11  CREATININE 1.01 0.83  CALCIUM 9.3 8.8*   Liver Function Tests No results for input(s): AST, ALT, ALKPHOS, BILITOT, PROT, ALBUMIN in the last 72 hours. No results for input(s): LIPASE, AMYLASE in the last 72 hours. High Sensitivity Troponin:   Recent Labs  Lab 11/26/20 1639 11/26/20 1918  TROPONINIHS 79* >24,000*    BNP Invalid input(s): POCBNP D-Dimer No results for input(s): DDIMER in the last 72 hours. Hemoglobin A1C Recent Labs    11/26/20 1639  HGBA1C 5.6   Fasting Lipid Panel Recent Labs    11/26/20 1639  CHOL 198  HDL 38*  LDLCALC 134*  TRIG 132  CHOLHDL 5.2   Thyroid Function Tests No results for input(s): TSH, T4TOTAL, T3FREE, THYROIDAB in the last 72 hours.  Invalid input(s): FREET3 _____________  CARDIAC CATHETERIZATION  Addendum Date: 11/26/2020     Mid LM lesion is 30% stenosed.   OstColon Flattery  LAD to Prox LAD lesion is 30% stenosed.   Ost LM lesion is 30% stenosed.   Ramus lesion is 30% stenosed.   1st Diag lesion is 100% stenosed.   A drug-eluting stent was successfully placed using a STENT ONYX FRONTIER 2.25X18.   Post intervention, there is a 0% residual stenosis. Renaldo Gornick is a 65 y.o. male  [id number] LOCATION:  FACILITY: [facility] PHYSICIAN: Quay Burow, M.D. 14-Apr-1955 DATE OF PROCEDURE:  11/26/2020 DATE OF DISCHARGE: CARDIAC CATHETERIZATION / PCI DES Diag # 1 History obtained from chart review.  Mr. 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  He has no cardiac risk factors and no prior cardiac history.  He developed chest and back pain this morning which is off and on throughout the day until he presented to [facility] emergency room where he was found to have anterolateral ST segment elevation with with reciprocal inferior depression.  He did have a widened mediastinum on portable chest x-ray which led to a stat CTA to rule out dissection which  prolonged the time to get to the Cath Lab and thus the door to balloon time.  After the CT scan showed no dissection he was brought urgently to the Cath Lab for angiography and intervention. PROCEDURE DESCRIPTION: The patient was brought to the second floor [place] [facility] cath lab in the postabsorptive state. He was not premedicated . His right wrist was prepped and shaved in usual sterile fashion. Xylocaine 1% was used for local anesthesia. A 6 French sheath was inserted into the right radial  artery using standard Seldinger technique. The patient received 3000 units  of heparin intravenously.  A 5 [place] TIG catheter and pigtail catheters were used for selective coronary angiography, and obtain left heart pressures.  Isovue dye was used for the entirety of the case (140 cc total administered to the patient).  Retrograde aortic, ventricular and pullback pressures were recorded.  Radial cocktail was administered via the SideArm sheath. Patient received a total of 11,000 use of heparin with an ACT over 300.  He received Brilinta 180 mg p.o. load.  Isovue dye is used for the entirety of the intervention.  Retroaortic pressures monitored during the case. Using a 6 [place] XB LAD 3.5 cm guide catheter along with a 0.14 Prowater guidewire and a 2 mm x 12 mm balloon the diagonal branch was easily crossed and dilated establishing antegrade flow.  I then stented the proximal diagonal branch with a 2.25 mm x 18 mm long Medtronic frontier drug-eluting stent at 18 atm (2.43 mm) resulting reduction with total occlusion to 0% residual TIMI-3 flow.  This turned out to be a fairly large diagonal branch.  With restoration of antegrade flow the patient's chest and back pain resolved and his EKG normalized. IMPRESSION: Successful first diagonal branch PCI drug-eluting stenting in the setting of anterolateral STEMI with a prolonged sort of balloon time because of needing to perform a chest CTA to rule out dissection.  The remainder  of his coronary anatomy was free of significant disease.  A 2D echo will be ordered.  He will need to be on dual antiplatelet therapy uninterrupted for at least 12 months.  The radial sheath was removed and a TR band was placed on the right wrist to achieve patent hemostasis.  The patient left lab in stable condition.  I anticipate discharge in 48 hours. Quay Burow. MD, [facility] 11/26/2020 6:50 PM  Addendum: After careful review of the post intervention angiogram there appeared to be a faint filling defect in the diagonal branch beyond the stented segment. This suggested thrombus. Based on this I elected to begin a short course of IV aggrastat. Lorretta Harp, M.D., [place], [facility], [place] [address] [place], [place]  [address] [phone] 11/26/2020 8:52 PM   Result Date: 11/26/2020 Images from the original result were not included.   Mid LM lesion is 30% stenosed.   Ost LAD to Prox LAD lesion is 30% stenosed.   Ost LM lesion is 30% stenosed.   Ramus lesion is 30% stenosed.   1st Diag lesion is 100% stenosed.   A drug-eluting stent was successfully placed using a STENT ONYX FRONTIER 2.25X18.   Post intervention, there is a 0% residual stenosis. Kentavius Dettore is a 65 y.o. male  [id number] LOCATION:  FACILITY: [facility] PHYSICIAN: Quay Burow, M.D. [birth year] DATE OF PROCEDURE:  11/26/2020 DATE OF DISCHARGE: CARDIAC CATHETERIZATION / PCI DES Diag # 1 History obtained from chart review.  Mr. 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  He has no cardiac risk factors and no prior cardiac history.  He developed chest and back pain this morning which is off and on throughout the day until he presented to [facility] emergency room where he was found to have anterolateral ST segment elevation with with reciprocal inferior depression.  He did have a widened mediastinum on portable  chest x-ray which led to a stat CTA to rule out dissection which prolonged the time to get to the Cath Lab and thus the door to balloon time.  After the CT scan showed no dissection he was brought urgently to the Cath Lab for angiography and intervention. PROCEDURE DESCRIPTION: The patient was brought to the second floor [place] [facility] cath lab in the postabsorptive state. He was not premedicated . His right wrist was prepped and shaved in usual sterile fashion. Xylocaine 1% was used for local anesthesia. A 6 French sheath was inserted into the right radial  artery using standard Seldinger technique. The patient received 3000 units  of heparin intravenously.  A 5 [place] TIG catheter and pigtail catheters were used for selective coronary angiography, and obtain left heart pressures.  Isovue dye was used for the entirety of the case (140 cc total administered to the patient).  Retrograde aortic, ventricular and pullback pressures were recorded.  Radial cocktail was administered via the SideArm sheath. Patient received a total of 11,000 use of heparin with an ACT over 300.  He received Brilinta 180 mg p.o. load.  Isovue dye is used for the entirety of the intervention.  Retroaortic pressures monitored during the case. Using a 6 [place] XB LAD 3.5 cm guide catheter along with a 0.14 Prowater guidewire and a 2 mm x 12 mm balloon the diagonal branch was easily crossed and dilated establishing antegrade flow.  I then stented the proximal diagonal branch with a 2.25 mm x 18 mm long Medtronic frontier drug-eluting stent at 18 atm (2.43 mm) resulting reduction with total occlusion to 0% residual TIMI-3 flow.  This turned out to be a fairly large diagonal branch.  With restoration of antegrade flow the patient's chest and back pain resolved and his EKG normalized.   Successful first diagonal branch PCI drug-eluting stenting in the setting of anterolateral STEMI with a prolonged sort of  balloon time because of needing to  perform a chest CTA to rule out dissection.  The remainder of his coronary anatomy was free of significant disease.  A 2D echo will be ordered.  He will need to be on dual antiplatelet therapy uninterrupted for at least 12 months.  The radial sheath was removed and a TR band was placed on the right wrist to achieve patent hemostasis.  The patient left lab in stable condition.  I anticipate discharge in 48 hours. Quay Burow. MD, [facility] 11/26/2020 6:50 PM    DG Chest Portable 1 View  Result Date: 11/26/2020 CLINICAL DATA:  Intermittent chest pain. EXAM: PORTABLE CHEST 1 VIEW COMPARISON:  January 19, 2019 FINDINGS: Low lung volumes are noted with mild areas of atelectasis and/or infiltrate are noted within the bilateral lung bases. There is no evidence of a pleural effusion or pneumothorax. The heart size and mediastinal contours are within normal limits. The visualized skeletal structures are unremarkable. IMPRESSION: Low lung volumes with mild bibasilar atelectasis and/or infiltrate. Electronically Signed   By: Virgina Norfolk M.D.   On: 11/26/2020 17:29   ECHOCARDIOGRAM COMPLETE  Result Date: 11/27/2020    ECHOCARDIOGRAM REPORT   Patient Name:   FAYETTE HAMADA Luckey Date of Exam: 11/27/2020 Medical Rec #:  [id number]             Height:       67.0 in Accession #:    [id number]            Weight:       228.6 lb Date of Birth:  01-31-1956             BSA:          2.141 m Patient Age:    72 years              BP:           138/73 mmHg Patient Gender: M                     HR:           72 bpm. Exam Location:  Inpatient Procedure: 2D Echo, Cardiac Doppler and Color Doppler Indications:    CAD  History:        Patient has no prior history of Echocardiogram examinations. CAD                 and Acute MI, Signs/Symptoms:Shortness of Breath and Chest Pain;                 Risk Factors:Obesity.  Sonographer:    Dustin Flock RDCS Referring Phys: [phone] Pearletha Forge BERRY  Sonographer Comments: Patient is morbidly  obese. IMPRESSIONS  1. Left ventricular ejection fraction, by estimation, is 40 to 45%. The left ventricle has mildly decreased function. The left ventricle demonstrates regional wall motion abnormalities (see scoring diagram/findings for description). There is mild concentric left ventricular hypertrophy. Left ventricular diastolic parameters are consistent with Grade II diastolic dysfunction (pseudonormalization). Elevated left atrial pressure. There is moderate hypokinesis of the left ventricular, apical anterior  wall.  2. Right ventricular systolic function is normal. The right ventricular size is normal.  3. Left atrial size was mildly dilated.  4. The mitral valve is normal in structure. No evidence of mitral valve regurgitation. No evidence of mitral stenosis.  5. The aortic valve is normal in structure. There is mild calcification of the aortic valve. Aortic valve regurgitation is not visualized. Mild  aortic valve sclerosis is present, with no evidence of aortic valve stenosis.  6. The inferior vena cava is dilated in size with <50% respiratory variability, suggesting right atrial pressure of 15 mmHg. FINDINGS  Left Ventricle: Left ventricular ejection fraction, by estimation, is 40 to 45%. The left ventricle has mildly decreased function. The left ventricle demonstrates regional wall motion abnormalities. Moderate hypokinesis of the left ventricular, apical anterior wall. The left ventricular internal cavity size was normal in size. There is mild concentric left ventricular hypertrophy. Left ventricular diastolic parameters are consistent with Grade II diastolic dysfunction (pseudonormalization). Elevated left atrial pressure.  LV Wall Scoring: The apical lateral segment, apical anterior segment, and apex are hypokinetic. The remainder of the anterior wall and antero-lateral wall, entire septum, entire inferior wall, and posterior wall are normal. Right Ventricle: The right ventricular size is normal. No  increase in right ventricular wall thickness. Right ventricular systolic function is normal. Left Atrium: Left atrial size was mildly dilated. Right Atrium: Right atrial size was normal in size. Pericardium: There is no evidence of pericardial effusion. Mitral Valve: The mitral valve is normal in structure. No evidence of mitral valve regurgitation. No evidence of mitral valve stenosis. Tricuspid Valve: The tricuspid valve is normal in structure. Tricuspid valve regurgitation is not demonstrated. No evidence of tricuspid stenosis. Aortic Valve: The aortic valve is normal in structure. There is mild calcification of the aortic valve. Aortic valve regurgitation is not visualized. Mild aortic valve sclerosis is present, with no evidence of aortic valve stenosis. Pulmonic Valve: The pulmonic valve was normal in structure. Pulmonic valve regurgitation is not visualized. No evidence of pulmonic stenosis. Aorta: The aortic root is normal in size and structure. Venous: The inferior vena cava is dilated in size with less than 50% respiratory variability, suggesting right atrial pressure of 15 mmHg. IAS/Shunts: No atrial level shunt detected by color flow Doppler.  LEFT VENTRICLE PLAX 2D LVIDd:         4.90 cm  Diastology LVIDs:         3.50 cm  LV e' medial:    5.33 cm/s LV PW:         1.20 cm  LV E/e' medial:  13.0 LV IVS:        1.20 cm  LV e' lateral:   6.31 cm/s LVOT diam:     2.00 cm  LV E/e' lateral: 11.0 LV SV:         65 LV SV Index:   31 LVOT Area:     3.14 cm  RIGHT VENTRICLE RV Basal diam:  2.80 cm RV S prime:     11.00 cm/s TAPSE (M-mode): 1.5 cm LEFT ATRIUM             Index       RIGHT ATRIUM           Index LA diam:        3.90 cm 1.82 cm/m  RA Area:     14.90 cm LA Vol (A2C):   22.7 ml 10.60 ml/m RA Volume:   37.00 ml  17.29 ml/m LA Vol (A4C):   32.9 ml 15.37 ml/m LA Biplane Vol: 27.9 ml 13.03 ml/m  AORTIC VALVE LVOT Vmax:   92.70 cm/s LVOT Vmean:  60.000 cm/s LVOT VTI:    0.208 m  AORTA Ao Root diam:  3.30 cm MITRAL VALVE MV Area (PHT): 4.46 cm    SHUNTS MV Decel Time: 170 msec    Systemic VTI:  0.21 m MV E velocity: 69.40 cm/s  Systemic Diam: 2.00 cm MV A velocity: 78.80 cm/s MV E/A ratio:  0.88 Mihai Croitoru MD Electronically signed by Sanda Klein MD Signature Date/Time: 11/27/2020/10:05:51 AM    Final    CT Angio Chest/Abd/Pel for Dissection W and/or Wo Contrast  Result Date: 11/26/2020 CLINICAL DATA:  STEMI, widened mediastinum on chest radiographs, concerning for type A dissection EXAM: CT ANGIOGRAPHY CHEST, ABDOMEN AND PELVIS TECHNIQUE: Non-contrast CT of the chest was initially obtained. Multidetector CT imaging through the chest, abdomen and pelvis was performed using the standard protocol during bolus administration of intravenous contrast. Multiplanar reconstructed images and MIPs were obtained and reviewed to evaluate the vascular anatomy. CONTRAST:  172m OMNIPAQUE IOHEXOL 350 MG/ML SOLN COMPARISON:  None. FINDINGS: CTA CHEST FINDINGS Cardiovascular: Preferential opacification of the thoracic aorta. 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 The remainder of the vessel is normal in caliber, descending thoracic aorta measuring up to 2.6 x 2.5 cm. No evidence of aortic dissection or other acute aortic pathology. No significant atherosclerosis. Normal heart size. Three-vessel coronary artery calcifications. No pericardial effusion. Mediastinum/Nodes: No enlarged mediastinal, hilar, or axillary lymph nodes. Thyroid gland, trachea, and esophagus demonstrate no significant findings. Lungs/Pleura: Lungs are clear. No pleural effusion or pneumothorax. Musculoskeletal: No chest wall abnormality. No acute or significant osseous findings. Review of the MIP images confirms the above findings. CTA ABDOMEN AND PELVIS FINDINGS VASCULAR Normal contour and caliber of the abdominal aorta. No evidence of aneurysm, dissection, or other acute aortic pathology. Standard  branching pattern of the abdominal aorta with solitary bilateral renal arteries. Review of the MIP images confirms the above findings. NON-VASCULAR Hepatobiliary: No solid liver abnormality is seen. No gallstones, gallbladder wall thickening, or biliary dilatation. Pancreas: Unremarkable. No pancreatic ductal dilatation or surrounding inflammatory changes. Spleen: Normal in size without significant abnormality. Adrenals/Urinary Tract: Adrenal glands are unremarkable. Simple cyst of the midportion of the right kidney. Kidneys are otherwise normal, without renal calculi, solid lesion, or hydronephrosis. Bladder is unremarkable. Stomach/Bowel: Stomach is within normal limits. Appendix appears normal. No evidence of bowel wall thickening, distention, or inflammatory changes. Sigmoid diverticula. Lymphatic: No enlarged abdominal or pelvic lymph nodes. Reproductive: Mild prostatomegaly. Other: Small, fat containing bilateral inguinal hernias. No abdominopelvic ascites. Musculoskeletal: No acute or significant osseous findings. Review of the MIP images confirms the above findings. IMPRESSION: 1. 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 The remainder of the thoracic and abdominal vessel is normal in caliber. Mild mixed atherosclerosis of the abdominal aorta. Recommend annual imaging followup by CTA or MRA. This recommendation follows 2010 ACCF/AHA/AATS/ACR/ASA/SCA/SCAI/SIR/STS/SVM Guidelines for the Diagnosis and Management of Patients with Thoracic Aortic Disease. Circulation. 2010; 121:: [id number] Aortic aneurysm NOS (ICD10-I71.9) 2. No evidence of aortic dissection or other acute aortic pathology. 3. Coronary artery disease. 4. Mild prostatomegaly. 5. Sigmoid diverticulosis without evidence of diverticulitis. These results were called by telephone at the time of interpretation on 11/26/2020 at 5:45 pm to Dr. BGwenlyn Found who verbally acknowledged these results. Electronically  Signed   By: AEddie CandleM.D.   On: 11/26/2020 17:56   Disposition   Pt is being discharged home today in good condition.  Follow-up Plans & Appointments     Follow-up Information     MAlmyra Deforest PUtahFollow up on 12/11/2020.   Specialties: Cardiology, Radiology Why: at 8:45am for your follow up appt Contact information: [address] [place] [address] [phone]                Discharge Instructions     Amb Referral to Cardiac Rehabilitation   Complete by: As directed    Diagnosis:  Coronary Stents STEMI PTCA     After initial evaluation and assessments completed: Virtual Based Care may be provided alone or in conjunction with Phase 2 Cardiac Rehab based on patient barriers.: Yes   Call MD for:  difficulty breathing, headache or visual disturbances   Complete by: As directed    Call MD for:  persistant dizziness or light-headedness   Complete by: As directed    Call MD for:  redness, tenderness, or signs of infection (pain, swelling, redness, odor or green/yellow discharge around incision site)   Complete by: As directed    Diet - low sodium heart healthy   Complete by: As directed    Discharge instructions   Complete by: As directed    Radial Site Care Refer to this sheet in the next few weeks. These instructions provide you with information on caring for yourself after your procedure. Your caregiver may also give you more specific instructions. Your treatment has been planned according to current medical practices, but problems sometimes occur. Call your caregiver if you have any problems or questions after your procedure. HOME CARE INSTRUCTIONS You may shower the day after the procedure. Remove the bandage (dressing) and gently wash the site with plain soap and water. Gently pat the site dry.  Do not apply powder or lotion to the site.  Do not submerge the affected site in water for 3 to 5 days.  Inspect the site at least twice daily.  Do not  flex or bend the affected arm for 24 hours.  No lifting over 5 pounds (2.3 kg) for 5 days after your procedure.  Do not drive home if you are discharged the same day of the procedure. Have someone else drive you.  You may drive 24 hours after the procedure unless otherwise instructed by your caregiver.  What to expect: Any bruising will usually fade within 1 to 2 weeks.  Blood that collects in the tissue (hematoma) may be painful to the touch. It should usually decrease in size and tenderness within 1 to 2 weeks.  SEEK IMMEDIATE MEDICAL CARE IF: You have unusual pain at the radial site.  You have redness, warmth, swelling, or pain at the radial site.  You have drainage (other than a small amount of blood on the dressing).  You have chills.  You have a fever or persistent symptoms for more than 72 hours.  You have a fever and your symptoms suddenly get worse.  Your arm becomes pale, cool, tingly, or numb.  You have heavy bleeding from the site. Hold pressure on the site.   PLEASE DO NOT MISS ANY DOSES OF YOUR BRILINTA!!!!! Also keep a log of you blood pressures and bring back to your follow up appt. Please call the office with any questions.   Patients taking blood thinners should generally stay away from medicines like ibuprofen, Advil, Motrin, naproxen, and Aleve due to risk of stomach bleeding. You may take Tylenol as directed or talk to your primary doctor about alternatives.  PLEASE ENSURE THAT YOU DO NOT RUN OUT OF YOUR BRILINTA. This medication is very important to remain on for at least one year. IF you have issues obtaining this medication due to cost please CALL the office 3-5 business days  prior to running out in order to prevent missing doses of this medication.   Increase activity slowly   Complete by: As directed        Discharge Medications   Allergies as of 11/28/2020       Reactions   Gabapentin Other (See Comments)   Causes aggressive feelings   Pseudoephedrine  Other (See Comments)   Makes the patient drowsy        Medication List     TAKE these medications    Aspirin Low Dose 81 MG EC tablet Generic drug: aspirin Take 1 tablet (81 mg total) by mouth daily. Swallow whole. Start taking on: November 29, 2020   atorvastatin 80 MG tablet Commonly known as: LIPITOR Take 1 tablet (80 mg total) by mouth daily. Start taking on: November 29, 2020   benzonatate 100 MG capsule Commonly known as: Best boy 1-2 capsules every  8 hours as needed for cough   Brilinta 90 MG Tabs tablet Generic drug: ticagrelor Take 1 tablet (90 mg total) by mouth 2 (two) times daily.   cyclobenzaprine 5 MG tablet Commonly known as: FLEXERIL Take 1 tablet (5 mg total) by mouth at bedtime as needed for muscle spasms.   Farxiga 10 MG Tabs tablet Generic drug: dapagliflozin propanediol Take 1 tablet (10 mg total) by mouth daily before breakfast.   fluticasone 50 MCG/ACT nasal spray Commonly known as: FLONASE Place 2 sprays into both nostrils daily.   losartan 25 MG tablet Commonly known as: COZAAR Take 1 tablet (25 mg total) by mouth daily. Start taking on: November 29, 2020   metoprolol succinate 50 MG 24 hr tablet Commonly known as: TOPROL-XL Take 1 tablet (50 mg total) by mouth daily. Take with or immediately following a meal. Start taking on: November 29, 2020   nitroGLYCERIN 0.4 MG SL tablet Commonly known as: Nitrostat Place 1 tablet (0.4 mg total) under the tongue every 5 (five) minutes as needed for chest pain.   olopatadine 0.1 % ophthalmic solution Commonly known as: Pataday Place 1 drop into both eyes 2 (two) times daily.   omeprazole 20 MG tablet Commonly known as: PRILOSEC OTC Take 20 mg by mouth daily as needed (for heartburn).   traZODone 50 MG tablet Commonly known as: DESYREL TAKE 1/2 TO 1 (ONE-HALF TO ONE) TABLET BY MOUTH AT BEDTIME AS NEEDED FOR SLEEP   vitamin B-12 100 MCG tablet Commonly known as:  CYANOCOBALAMIN Take 100 mcg by mouth every other day.   vitamin C 500 MG tablet Commonly known as: ASCORBIC ACID Take 500-1,000 mg by mouth daily.   Vitamin D3 50 MCG (2000 UT) Tabs Take 2,000 Units by mouth 2 (two) times a week.           Outstanding Labs/Studies   FLP/LFTs in 8 weeks  BMET at follow up  Duration of Discharge Encounter   Greater than 30 minutes including physician time.  Signed, Reino Bellis, NP 11/28/2020, 12:41 PM   ATTENDING ATTESTATION  I have seen, examined and evaluated the patient this AM on rounds along with Reino Bellis, NP-C.  After reviewing all the available data and chart, we discussed the patients laboratory, study & physical findings as well as symptoms in detail. I agree with her findings, examination, summary as well as impression recommendations as per our discussion.     Completely stable post MI day 2.  Does have mild to moderately reduced EF, no heart failure symptoms or recurrent angina.  He is on stable  regimen.  Stable for discharge home.  Agree with discharge summary     Glenetta Hew, M.D., M.S. Interventional Cardiologist   Pager # [phone] Phone # [phone] [address] [place], [place] [address]

## 2020-11-28 NOTE — Progress Notes (Signed)
Pt out walking with his wife, feels good. No questions. Reinforced importance of Brilinta. [id number] Ethelda Chick CES, ACSM 10:50 AM 11/28/2020

## 2020-11-28 NOTE — Progress Notes (Signed)
Pt discharged home with wife. IVs and telemetry box removed. Pt and wife received discharge instructions and all questions were answered. Pt left with all of his belongings. Pt discharged via wheelchair and was accompanied by a Ferne Coe.

## 2020-11-29 NOTE — Telephone Encounter (Signed)
Called TOC, LVM to call back to discuss upcoming visit.  Left call back number.

## 2020-11-30 NOTE — Telephone Encounter (Signed)
Patient contacted regarding discharge from [facility] on 11/28/20  Patient understands to follow up with provider Lisabeth Devoid on 12/11/20 at 8:45 am at [facility]. Patient understands discharge instructions? yes  Patient understands medications and regiment? yes  Patient understands to bring all medications to this visit? yes

## 2020-12-05 ENCOUNTER — Other Ambulatory Visit (HOSPITAL_BASED_OUTPATIENT_CLINIC_OR_DEPARTMENT_OTHER): Payer: Self-pay

## 2020-12-05 ENCOUNTER — Telehealth: Payer: Self-pay | Admitting: Cardiovascular Disease

## 2020-12-05 ENCOUNTER — Telehealth (HOSPITAL_BASED_OUTPATIENT_CLINIC_OR_DEPARTMENT_OTHER): Payer: Self-pay | Admitting: Pharmacist

## 2020-12-05 ENCOUNTER — Other Ambulatory Visit (HOSPITAL_COMMUNITY): Payer: Self-pay

## 2020-12-05 MED ORDER — CLOPIDOGREL BISULFATE 75 MG PO TABS: ORAL_TABLET | ORAL | 3 refills | Status: AC

## 2020-12-05 NOTE — Telephone Encounter (Signed)
Pt c/o Shortness Of Breath: STAT if SOB developed within the last 24 hours or pt is noticeably SOB on the phone  1. Are you currently SOB (can you hear that pt is SOB on the phone)? No   2. How long have you been experiencing SOB? One week   3. Are you SOB when sitting or when up moving around? Sitting up   4. Are you currently experiencing any other symptoms?  pt had a heartattack last week and is now having difficulty breathing.. pt cannot catch his breath. Didnt feel good yesterday, soreness in his back

## 2020-12-05 NOTE — Telephone Encounter (Signed)
Pharmacy Transitions of Care Follow-up Telephone Call  Date of discharge: 11/28/2020  Discharge Diagnosis: NSTEMI  How have you been since you were released from the hospital? Recovery well. Has had some bothersome headaches since hospitalization, as well as shortness of breath. Discussed potentials of these side effects. Advised that if headache worsens and/or he started to experience blurred vision to call his doctor.   Medication changes made at discharge:  - START: ASA 81 mg, atorvastatin 80 mg, Farxiga 10 mg, losartan 25 mg, metoprolol succinate 50 mg, Brilinta 90 mg   Medication changes verified by the patient? YES    Medication Accessibility:  Home Pharmacy: [facility] [address] in [place]    Was the patient provided with refills on discharged medications? Yes   Have all prescriptions been transferred from [facility] to home pharmacy? Completed now.   Is the patient able to afford medications? YES Qualifies for manufacturer monthly copay card  Marden Noble is $35 per monthly under insurance without copay card     Medication Review:  TICAGRELOR (BRILINTA) Ticagrelor 90 mg BID initiated on 11/28/2020.  - Educated patient on expected duration of therapy of aspirin with ticagrelor.  - Discussed importance of taking medication around the same time every day, - Reviewed potential DDIs with patient - Advised patient of medications to avoid (NSAIDs, aspirin maintenance doses>100 mg daily) - Educated that Tylenol (acetaminophen) will be the preferred analgesic to prevent risk of bleeding  - Emphasized importance of monitoring for signs and symptoms of bleeding (abnormal bruising, prolonged bleeding, nose bleeds, bleeding from gums, discolored urine, black tarry stools)  - Educated patient to notify doctor if shortness of breath or abnormal heartbeat occur - Advised patient to alert all providers of antiplatelet therapy prior to starting a new medication or having a procedure   Follow-up  Appointments:  PCP Hospital f/u appt confirmed for 12/12/2020.   Cardiology f/u appt confirmed for 12/11/2020.  If their condition worsens, is the pt aware to call PCP or go to the Emergency Dept.? YES   Theodis Sato, PharmD Clinical Pharmacist [facility]  12/05/2020 10:14 AM

## 2020-12-05 NOTE — Telephone Encounter (Signed)
Per Dr. Vilma Prader Pt try switching to plavix.  600 mg plavix loading dose.  Pt has taken AM Brilinta.  He will hold tonight's dose and stop.  Start Plavix tomorrow AM-take 600 mg PO.  Then reduce to one tablet 75 mg by mouth daily.  Pt aware.  F/u visit scheduled.

## 2020-12-05 NOTE — Telephone Encounter (Signed)
Pt's wife is returning call from this morning. Please advise further

## 2020-12-05 NOTE — Telephone Encounter (Signed)
Returned call to pt's wife.  Per wife, Pt has not felt well for the last 3-4 days.  States he will be sitting and "twists or moves" and he will become short of breath.  Also complains of a "soreness" in his middle back.  And has had headache.  Pt is concerned about the shortness of breath.  Will reach out to Dr. Allyson Sabal with further advisement-Pt discharged post stent on Brilinta.

## 2020-12-11 ENCOUNTER — Ambulatory Visit (INDEPENDENT_AMBULATORY_CARE_PROVIDER_SITE_OTHER): Payer: BC Managed Care – PPO | Admitting: Physician Assistant

## 2020-12-11 ENCOUNTER — Other Ambulatory Visit: Payer: Self-pay

## 2020-12-11 ENCOUNTER — Encounter: Payer: Self-pay | Admitting: Physician Assistant

## 2020-12-11 VITALS — BP 136/76 | HR 71 | Resp 20 | Ht 68.0 in | Wt 226.2 lb

## 2020-12-11 DIAGNOSIS — I255 Ischemic cardiomyopathy: Secondary | ICD-10-CM

## 2020-12-11 DIAGNOSIS — I712 Thoracic aortic aneurysm, without rupture, unspecified: Secondary | ICD-10-CM

## 2020-12-11 DIAGNOSIS — E785 Hyperlipidemia, unspecified: Secondary | ICD-10-CM

## 2020-12-11 DIAGNOSIS — Z79899 Other long term (current) drug therapy: Secondary | ICD-10-CM | POA: Diagnosis not present

## 2020-12-11 DIAGNOSIS — I2510 Atherosclerotic heart disease of native coronary artery without angina pectoris: Secondary | ICD-10-CM | POA: Diagnosis not present

## 2020-12-11 MED ORDER — ENTRESTO 49-51 MG PO TABS: 1.0000 | ORAL_TABLET | Freq: Two times a day (BID) | ORAL | 3 refills | Status: DC

## 2020-12-11 NOTE — Patient Instructions (Signed)
Medication Instructions:  STOP Losartan START Entresto 49-51 2 times a day  *If you need a refill on your cardiac medications before your next appointment, please call your pharmacy*  Lab Work: Your physician recommends that you return for lab work in 1 WEEK:  BMET  Your physician recommends that you return for lab work in 4 WEEKS:  Fasting Lipid Panel-DO NOT EAT OR DRINK PAST MIDNIGHT. OKAY TO HAVE WATER. Hepatic (Liver) Function Test  If you have labs (blood work) drawn today and your tests are completely normal, you will receive your results only by: MyChart Message (if you have MyChart) OR A paper copy in the mail If you have any lab test that is abnormal or we need to change your treatment, we will call you to review the results.  Testing/Procedures: NONE ordered at this time of appointment   Follow-Up: At [facility], you and your health needs are our priority.  As part of our continuing mission to provide you with exceptional heart care, we have created designated Provider Care Teams.  These Care Teams include your primary Cardiologist (physician) and Advanced Practice Providers (APPs -  Physician Assistants and Nurse Practitioners) who all work together to provide you with the care you need, when you need it.  Your next appointment:   3 month(s)  The format for your next appointment:   In Person  Provider:   Bryan Lemma, MD   Other Instructions

## 2020-12-11 NOTE — Progress Notes (Signed)
Cardiology Office Note:    Date:  12/13/2020   ID:  Jose Edwards, DOB [birth year], MRN [id number]  PCP:  Karie Schwalbe, MD   [facility] Providers Cardiologist:  Nanetta Batty, MD     Referring MD: Karie Schwalbe, MD   Chief Complaint  Patient presents with   Follow-up    Seen for Dr. Allyson Sabal    History of Present Illness:    Jose Edwards is a 65 y.o. male with a hx of CAD, TAA and HLD.  Patient recently presented to the hospital on 11/26/2020 with anterolateral STEMI.  Chest x-ray showed dilated mediastinotomy.  CTA of chest abdomen pelvis showed mild enlargement of the ascending thoracic aorta measuring up to 4.1 x 4.0 cm, no evidence of aortic dissection.  He was taken directly to the Cath Lab and found to have a occluded D1 that was treated with drug-eluting stent, otherwise he had mild 30% disease in other vessels.  Postprocedure, he was placed on aspirin and Brilinta.  Serial troponin trended up to greater than 24,000. Echocardiogram obtained on 11/28/2018 showed EF 40 to 45%, mild LVH, grade 2 DD, no significant valve issue.  Given LV dysfunction, he was discharged on Toprol-XL, Farxiga and losartan.  Brilinta was later switched to Plavix due to side effect of shortness of breath and headache.  Patient presents today for cardiology follow-up along with his wife.  He still has occasional backache which was delusional symptom that brought him to the hospital, however his current backache is more noticeable with palpation making me suspect it is more musculoskeletal issue rather than cardiac.  He did not have clear chest discomfort when he had acute heart attack.  EKG shows no significant changes.  He has been compliant with aspirin and Plavix, emphasis has been placed on compliance with dual antiplatelet therapy.  His blood pressure is still mildly elevated, I will discontinue his losartan and switch to Entresto 49-51 mg twice a day.  He will need a basic metabolic  panel about a week after he started on the [place].  He is due for fasting lipid panel and LFTs in 4 weeks.  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  I recommended he wait until next Monday 12/17/2020 before going back to work.  In the first 2 weeks after he returned to work, I would also recommend lifting restriction of less than 25 pounds.  After 2 weeks, he can work without further restriction.  Otherwise, he can follow-up with Dr. Herbie Baltimore in 3 months.  Past Medical History:  Diagnosis Date   Acute ST elevation myocardial infarction (STEMI) of anterolateral wall (HCC) 11/26/2020   100% D1 - DES PCI 2.25 x 16 (2.4 mm)   Benign positional vertigo    CAD S/P DES PCI D1  11/27/2020   100% D1 (DES PCI with Onyx Frontier DES 2.25 x 18 - deployed to 2.4 mm)   Coronary artery disease involving native coronary artery of native heart with angina pectoris (HCC) 11/26/2020   8/29 (Ant-lat STEMI): Cath => Culprit lesion-100% D1 (DES PCI with Onyx Frontier DES 2.25 x 18 - deployed to 2.4 mm). Mid LM 30%, Ost-prox LAD 30% & RI 30%.     ED (erectile dysfunction)    GERD (gastroesophageal reflux disease)    Osteoarthrosis, unspecified whether generalized or localized, unspecified site     Past Surgical History:  Procedure Laterality Date   ANKLE SURGERY  2008   fracture left ankle ( 2  pins)   CORONARY STENT INTERVENTION N/A 11/26/2020   Procedure: CORONARY STENT INTERVENTION;  Surgeon: Runell Gess, MD;  Location: [facility] INVASIVE CV LAB;  Service: Cardiovascular;  Laterality: N/A;   LEFT HEART CATH AND CORONARY ANGIOGRAPHY N/A 11/26/2020   Procedure: LEFT HEART CATH AND CORONARY ANGIOGRAPHY;  Surgeon: Runell Gess, MD;  Location: [facility] INVASIVE CV LAB;  Service: Cardiovascular;  Laterality: N/A;   ROTATOR CUFF REPAIR  2005   right shoulder    Current Medications: Current Meds  Medication Sig   aspirin 81 MG EC tablet Take 1 tablet (81 mg total) by mouth daily. Swallow whole.   atorvastatin  (LIPITOR) 80 MG tablet Take 1 tablet (80 mg total) by mouth daily.   benzonatate (TESSALON PERLES) 100 MG capsule 1-2 capsules every  8 hours as needed for cough (Patient taking differently: 1-2 capsules every  8 hours as needed for cough)   Cholecalciferol (VITAMIN D3) 50 MCG (2000 UT) TABS Take 2,000 Units by mouth 2 (two) times a week.   clopidogrel (PLAVIX) 75 MG tablet Take 8 tablets (600 mg total) by mouth daily for 1 day, THEN 1 tablet (75 mg total) daily.   dapagliflozin propanediol (FARXIGA) 10 MG TABS tablet Take 1 tablet (10 mg total) by mouth daily before breakfast.   fluticasone (FLONASE) 50 MCG/ACT nasal spray Place 2 sprays into both nostrils daily.   metoprolol succinate (TOPROL-XL) 50 MG 24 hr tablet Take 1 tablet (50 mg total) by mouth daily. Take with or immediately following a meal.   nitroGLYCERIN (NITROSTAT) 0.4 MG SL tablet Place 1 tablet (0.4 mg total) under the tongue every 5 (five) minutes as needed for chest pain.   omeprazole (PRILOSEC OTC) 20 MG tablet Take 20 mg by mouth daily as needed (for heartburn).   sacubitril-valsartan (ENTRESTO) 49-51 MG Take 1 tablet by mouth 2 (two) times daily.   vitamin B-12 (CYANOCOBALAMIN) 100 MCG tablet Take 100 mcg by mouth every other day.    vitamin C (ASCORBIC ACID) 500 MG tablet Take 500-1,000 mg by mouth daily.   [DISCONTINUED] losartan (COZAAR) 25 MG tablet Take 1 tablet (25 mg total) by mouth daily.     Allergies:   Gabapentin and Pseudoephedrine   Social History   Socioeconomic History   Marital status: Married    Spouse name: Not on file   Number of children: 3   Years of education: 12   Highest education level: Not on file  Occupational History   Occupation: CARPENTER    Employer: [employer]  Tobacco Use   Smoking status: Never   Smokeless tobacco: Never  Vaping Use   Vaping Use: Never used  Substance and Sexual Activity   Alcohol use: Yes    Comment: Beer socially (once weekly)   Drug use: No   Sexual  activity: Not on file  Other Topics Concern   Not on file  Social History Narrative   Lives with wife in a one story home.  Has 3 sons.  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  Education: high school.   Social Determinants of Health   Financial Resource Strain: Not on file  Food Insecurity: Not on file  Transportation Needs: Not on file  Physical Activity: Not on file  Stress: Not on file  Social Connections: Not on file     Family History: The patient's family history includes Cancer in his father and mother; Diabetes in his paternal grandmother and sister. There is no history of Coronary  artery disease or Hypertension.  ROS:   Please see the history of present illness.     All other systems reviewed and are negative.  EKGs/Labs/Other Studies Reviewed:    The following studies were reviewed today:  Cath 11/26/2020   Mid LM lesion is 30% stenosed.   Ost LAD to Prox LAD lesion is 30% stenosed.   Ost LM lesion is 30% stenosed.   Ramus lesion is 30% stenosed.   1st Diag lesion is 100% stenosed.   A drug-eluting stent was successfully placed using a STENT ONYX FRONTIER 2.25X18.   Post intervention, there is a 0% residual stenosis.   Echo 11/27/2020  1. Left ventricular ejection fraction, by estimation, is 40 to 45%. The  left ventricle has mildly decreased function. The left ventricle  demonstrates regional wall motion abnormalities (see scoring  diagram/findings for description). There is mild  concentric left ventricular hypertrophy. Left ventricular diastolic  parameters are consistent with Grade II diastolic dysfunction  (pseudonormalization). Elevated left atrial pressure. There is moderate  hypokinesis of the left ventricular, apical anterior   wall.   2. Right ventricular systolic function is normal. The right ventricular  size is normal.   3. Left atrial size was mildly dilated.   4. The mitral valve is normal in structure. No evidence of mitral valve   regurgitation. No evidence of mitral stenosis.   5. The aortic valve is normal in structure. There is mild calcification  of the aortic valve. Aortic valve regurgitation is not visualized. Mild  aortic valve sclerosis is present, with no evidence of aortic valve  stenosis.   6. The inferior vena cava is dilated in size with <50% respiratory  variability, suggesting right atrial pressure of 15 mmHg.    EKG:  EKG is ordered today.  The ekg ordered today demonstrates normal sinus rhythm, T wave inversion in the lateral leads.  Recent Labs: 11/27/2020: BUN 11; Creatinine, Ser 0.83; Hemoglobin 13.5; Platelets 231; Potassium 4.0; Sodium 137  Recent Lipid Panel    Component Value Date/Time   CHOL 198 11/26/2020 1639   TRIG 132 11/26/2020 1639   HDL 38 (L) 11/26/2020 1639   CHOLHDL 5.2 11/26/2020 1639   VLDL 26 11/26/2020 1639   LDLCALC 134 (H) 11/26/2020 1639   LDLDIRECT 155.1 02/27/2010 1239     Risk Assessment/Calculations:           Physical Exam:    VS:  BP 136/76 (BP Location: Left Arm, Patient Position: Sitting)   Pulse 71   Resp 20   Ht 5\' 8"  (1.727 m)   Wt 226 lb 3.2 oz (102.6 kg)   SpO2 99%   BMI 34.39 kg/m     Wt Readings from Last 3 Encounters:  12/12/20 224 lb (101.6 kg)  12/11/20 226 lb 3.2 oz (102.6 kg)  11/27/20 228 lb 9.9 oz (103.7 kg)     GEN:  Well nourished, well developed in no acute distress HEENT: Normal NECK: No JVD; No carotid bruits LYMPHATICS: No lymphadenopathy CARDIAC: RRR, no murmurs, rubs, gallops RESPIRATORY:  Clear to auscultation without rales, wheezing or rhonchi  ABDOMEN: Soft, non-tender, non-distended MUSCULOSKELETAL:  No edema; No deformity  SKIN: Warm and dry NEUROLOGIC:  Alert and oriented x 3 PSYCHIATRIC:  Normal affect   ASSESSMENT:    1. Coronary artery disease involving native coronary artery of native heart without angina pectoris   2. Medication management   3. Hyperlipidemia, unspecified hyperlipidemia type   4.  Thoracic aortic aneurysm without rupture (  HCC)   5. Ischemic cardiomyopathy    PLAN:    In order of problems listed above:  CAD: Recent DES to diagonal.  She has anginal symptoms with back pain.  Since discharge, he does have intermittent back pain, however his new back pain seems to be more musculoskeletal pain as it is worse with palpation.  I recommended continue aspirin and Lipitor.  EKG shows new T wave inversion in the lateral leads.  I reviewed this EKG with DOD who felt that this is likely evolutionary changes since the recent heart attack.  Hyperlipidemia: Continue on Lipitor  Thoracic aortic aneurysm: Recent CTA showed dilated thoracic aorta measuring at 4.1 x 4.0 cm, will need to repeat imaging in 1 year  Ischemic cardiomyopathy: Switch losartan to Entresto.  Continue Toprol-XL on the Farxiga.        Medication Adjustments/Labs and Tests Ordered: Current medicines are reviewed at length with the patient today.  Concerns regarding medicines are outlined above.  Orders Placed This Encounter  Procedures   Lipid panel   Basic metabolic panel   Hepatic function panel   EKG 12-Lead   Meds ordered this encounter  Medications   sacubitril-valsartan (ENTRESTO) 49-51 MG    Sig: Take 1 tablet by mouth 2 (two) times daily.    Dispense:  60 tablet    Refill:  3    Patient Instructions  Medication Instructions:  STOP Losartan START Entresto 49-51 2 times a day  *If you need a refill on your cardiac medications before your next appointment, please call your pharmacy*  Lab Work: Your physician recommends that you return for lab work in 1 WEEK:  BMET  Your physician recommends that you return for lab work in 4 WEEKS:  Fasting Lipid Panel-DO NOT EAT OR DRINK PAST MIDNIGHT. OKAY TO HAVE WATER. Hepatic (Liver) Function Test  If you have labs (blood work) drawn today and your tests are completely normal, you will receive your results only by: MyChart Message (if you have  MyChart) OR A paper copy in the mail If you have any lab test that is abnormal or we need to change your treatment, we will call you to review the results.  Testing/Procedures: NONE ordered at this time of appointment   Follow-Up: At [facility], you and your health needs are our priority.  As part of our continuing mission to provide you with exceptional heart care, we have created designated Provider Care Teams.  These Care Teams include your primary Cardiologist (physician) and Advanced Practice Providers (APPs -  Physician Assistants and Nurse Practitioners) who all work together to provide you with the care you need, when you need it.  Your next appointment:   3 month(s)  The format for your next appointment:   In Person  Provider:   Bryan Lemma, MD   Other Instructions    Signed, Azalee Course, PA  12/13/2020 9:15 PM    [facility]

## 2020-12-12 ENCOUNTER — Encounter: Payer: Self-pay | Admitting: Internal Medicine

## 2020-12-12 ENCOUNTER — Ambulatory Visit (INDEPENDENT_AMBULATORY_CARE_PROVIDER_SITE_OTHER): Payer: BC Managed Care – PPO | Admitting: Internal Medicine

## 2020-12-12 ENCOUNTER — Other Ambulatory Visit: Payer: Self-pay

## 2020-12-12 DIAGNOSIS — I255 Ischemic cardiomyopathy: Secondary | ICD-10-CM | POA: Diagnosis not present

## 2020-12-12 DIAGNOSIS — I25119 Atherosclerotic heart disease of native coronary artery with unspecified angina pectoris: Secondary | ICD-10-CM | POA: Diagnosis not present

## 2020-12-12 DIAGNOSIS — E785 Hyperlipidemia, unspecified: Secondary | ICD-10-CM

## 2020-12-12 NOTE — Progress Notes (Signed)
Subjective:    Patient ID: Jose Edwards, male    DOB: [birth year], 65 y.o.   MRN: [id number]  HPI Here for hospital follow up This visit occurred during the SARS-CoV-2 public health emergency.  Safety protocols were in place, including screening questions prior to the visit, additional usage of staff PPE, and extensive cleaning of exam room while observing appropriate contact time as indicated for disinfecting solutions.   Had cramps for 4-6 weeks Shoulders/biceps/back---would feel real tight Symptoms even on riding mower This then stopped---restarted one week before hospital  Then went to granddaughter's first birthday party [place] home and started on mower Cramping came back---and was worse Next day better Then went to work and had tightening on 8/29. Then sweating--knew "something was wrong" Wife drove him to ER Acute MI---CT negative for dissection Cath done and 1st diagonal dilated and stent placed  EF 40-45% on echo Still has some soreness on right side of neck/shoulder  No chest pain--only had slight CP after getting to ER No SOB---except on brilinta (now on clopidogrel) No palpitations Was put on entresto yesterday  Current Outpatient Medications on File Prior to Visit  Medication Sig Dispense Refill   aspirin 81 MG EC tablet Take 1 tablet (81 mg total) by mouth daily. Swallow whole. 90 tablet 3   atorvastatin (LIPITOR) 80 MG tablet Take 1 tablet (80 mg total) by mouth daily. 90 tablet 3   benzonatate (TESSALON PERLES) 100 MG capsule 1-2 capsules every  8 hours as needed for cough (Patient taking differently: 1-2 capsules every  8 hours as needed for cough) 30 capsule 0   Cholecalciferol (VITAMIN D3) 50 MCG (2000 UT) TABS Take 2,000 Units by mouth 2 (two) times a week.     clopidogrel (PLAVIX) 75 MG tablet Take 8 tablets (600 mg total) by mouth daily for 1 day, THEN 1 tablet (75 mg total) daily. 98 tablet 3   dapagliflozin propanediol (FARXIGA) 10 MG TABS tablet  Take 1 tablet (10 mg total) by mouth daily before breakfast. 30 tablet 11   fluticasone (FLONASE) 50 MCG/ACT nasal spray Place 2 sprays into both nostrils daily. 16 g 2   metoprolol succinate (TOPROL-XL) 50 MG 24 hr tablet Take 1 tablet (50 mg total) by mouth daily. Take with or immediately following a meal. 30 tablet 11   nitroGLYCERIN (NITROSTAT) 0.4 MG SL tablet Place 1 tablet (0.4 mg total) under the tongue every 5 (five) minutes as needed for chest pain. 25 tablet 1   omeprazole (PRILOSEC OTC) 20 MG tablet Take 20 mg by mouth daily as needed (for heartburn).     sacubitril-valsartan (ENTRESTO) 49-51 MG Take 1 tablet by mouth 2 (two) times daily. 60 tablet 3   vitamin B-12 (CYANOCOBALAMIN) 100 MCG tablet Take 100 mcg by mouth every other day.      vitamin C (ASCORBIC ACID) 500 MG tablet Take 500-1,000 mg by mouth daily.     No current facility-administered medications on file prior to visit.    Allergies  Allergen Reactions   Gabapentin Other (See Comments)    Causes aggressive feelings   Pseudoephedrine Other (See Comments)    Makes the patient drowsy    Past Medical History:  Diagnosis Date   Acute ST elevation myocardial infarction (STEMI) of anterolateral wall (HCC) 11/26/2020   100% D1 - DES PCI 2.25 x 16 (2.4 mm)   Benign positional vertigo    CAD S/P DES PCI D1  11/27/2020   100% D1 (DES PCI  with Onyx Frontier DES 2.25 x 18 - deployed to 2.4 mm)   Coronary artery disease involving native coronary artery of native heart with angina pectoris (HCC) 11/26/2020   8/29 (Ant-lat STEMI): Cath => Culprit lesion-100% D1 (DES PCI with Onyx Frontier DES 2.25 x 18 - deployed to 2.4 mm). Mid LM 30%, Ost-prox LAD 30% & RI 30%.     ED (erectile dysfunction)    GERD (gastroesophageal reflux disease)    Osteoarthrosis, unspecified whether generalized or localized, unspecified site     Past Surgical History:  Procedure Laterality Date   ANKLE SURGERY  2008   fracture left ankle ( 2 pins)    CORONARY STENT INTERVENTION N/A 11/26/2020   Procedure: CORONARY STENT INTERVENTION;  Surgeon: Runell Gess, MD;  Location: [facility];  Service: Cardiovascular;  Laterality: N/A;   LEFT HEART CATH AND CORONARY ANGIOGRAPHY N/A 11/26/2020   Procedure: LEFT HEART CATH AND CORONARY ANGIOGRAPHY;  Surgeon: Runell Gess, MD;  Location: [facility];  Service: Cardiovascular;  Laterality: N/A;   ROTATOR CUFF REPAIR  2005   right shoulder    Family History  Problem Relation Age of Onset   Cancer Mother    Diabetes Sister    Diabetes Paternal Grandmother    Cancer Father    Coronary artery disease Neg Hx    Hypertension Neg Hx     Social History   Socioeconomic History   Marital status: Married    Spouse name: Not on file   Number of children: 3   Years of education: 12   Highest education level: Not on file  Occupational History   Occupation: CARPENTER    Employer: [employer]  Tobacco Use   Smoking status: Never   Smokeless tobacco: Never  Vaping Use   Vaping Use: Never used  Substance and Sexual Activity   Alcohol use: Yes    Comment: Beer socially (once weekly)   Drug use: No   Sexual activity: Not on file  Other Topics Concern   Not on file  Social History Narrative   Lives with wife in a one story home.  Has 3 sons.  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  Education: high school.   Social Determinants of Health   Financial Resource Strain: Not on file  Food Insecurity: Not on file  Transportation Needs: Not on file  Physical Activity: Not on file  Stress: Not on file  Social Connections: Not on file  Intimate Partner Violence: Not on file   Review of Systems No tobacco Walks some with dog--no set exercise Weight is about the same as years ago--did go down as low as 205#    Objective:   Physical Exam Constitutional:      Appearance: Normal appearance.  Cardiovascular:     Rate and Rhythm: Normal rate and regular rhythm.      Pulses: Normal pulses.     Heart sounds: No murmur heard.   No gallop.  Pulmonary:     Effort: Pulmonary effort is normal.     Breath sounds: Normal breath sounds. No wheezing or rales.  Abdominal:     Palpations: Abdomen is soft.     Tenderness: There is no abdominal tenderness.  Musculoskeletal:     Cervical back: Neck supple.     Right lower leg: No edema.     Left lower leg: No edema.  Lymphadenopathy:     Cervical: No cervical adenopathy.  Neurological:  Mental Status: He is alert.  Psychiatric:        Mood and Affect: Mood normal.        Behavior: Behavior normal.           Assessment & Plan:

## 2020-12-12 NOTE — Assessment & Plan Note (Signed)
I suspect his EF will improve on follow up testing May be able to stop the sacubitril in time May be candidate for cardiac rehab

## 2020-12-12 NOTE — Assessment & Plan Note (Signed)
Still has some neck/back pain--not clear if that is still angina Is on statin, entresto, plavix, farxiga, asa

## 2020-12-12 NOTE — Assessment & Plan Note (Signed)
No problems thus far on the atorvastatin 80mg 

## 2020-12-12 NOTE — Patient Instructions (Signed)

## 2020-12-13 ENCOUNTER — Encounter: Payer: Self-pay | Admitting: Physician Assistant

## 2020-12-14 ENCOUNTER — Telehealth: Payer: Self-pay | Admitting: *Deleted

## 2020-12-14 DIAGNOSIS — Z006 Encounter for examination for normal comparison and control in clinical research program: Secondary | ICD-10-CM

## 2020-12-14 NOTE — Telephone Encounter (Signed)
Attempted to reach patient by phone on 12/07/20 and 12/11/20 to discuss his participation in research, no answer for either calls, left voicemail for return call but no return call. Lost to follow-up.

## 2020-12-17 ENCOUNTER — Telehealth: Payer: Self-pay | Admitting: Physician Assistant

## 2020-12-17 NOTE — Telephone Encounter (Signed)
Patient called back in- states that he was started on the [place], and as soon as he took it about 45 minutes after taking it he began having a headache, he states it would ease off but not go away altogether. He states he took it for 3 days, and by the end of the 3rd day his head was hurting so bad he just could not take it anymore, he was unable to sleep at night due to the headache, he states he tried tylenol with no relief, he did mention that he stopped it on the 4th day, the headache got better, and then by the next day the headache was gone. He states he does not want to go back on it. I advised I would route to PA and PHARMD to advise of the effects, and further recommendations.   Thank you!

## 2020-12-17 NOTE — Telephone Encounter (Signed)
Have him go back on the losartan, but increase the dose to 50 mg, as Wynema Birch mentioned that his BP was elevated.  It's the next best thing if he cannot tolerate the Entresto.

## 2020-12-17 NOTE — Telephone Encounter (Signed)
Pt c/o medication issue:  1. Name of Medication: sacubitril-valsartan (ENTRESTO) 49-51 MG  2. How are you currently taking this medication (dosage and times per day)? Take 1 tablet by mouth 2 (two) times daily.  3. Are you having a reaction (difficulty breathing--STAT)? No  4. What is your medication issue? Pt was prescribed Entresto by Azalee Course on 12/11/20, pt took medicine for 3 days and have had a severe headache since he started the medicine. Pt wants to know if there's another medication he can sub for?

## 2020-12-17 NOTE — Telephone Encounter (Signed)
Called patient to discuss further-  Left call back number.

## 2020-12-17 NOTE — Telephone Encounter (Signed)
Called patient back- I advised of the message below patient states that he will call us back- he states he was unaware to stop the losartan and start Entresto, but he states his wife will know and he will ask her if he had been taking both. If he was taking both, he will stop losartan and try entresto, if he was not taking dose he will take losartan 50 mg ...  Patient will call back tomorrow.

## 2020-12-18 ENCOUNTER — Other Ambulatory Visit: Payer: Self-pay | Admitting: *Deleted

## 2020-12-18 ENCOUNTER — Other Ambulatory Visit: Payer: Self-pay

## 2020-12-18 DIAGNOSIS — I255 Ischemic cardiomyopathy: Secondary | ICD-10-CM

## 2020-12-18 DIAGNOSIS — I2510 Atherosclerotic heart disease of native coronary artery without angina pectoris: Secondary | ICD-10-CM | POA: Diagnosis not present

## 2020-12-18 DIAGNOSIS — Z79899 Other long term (current) drug therapy: Secondary | ICD-10-CM | POA: Diagnosis not present

## 2020-12-18 DIAGNOSIS — E785 Hyperlipidemia, unspecified: Secondary | ICD-10-CM

## 2020-12-18 LAB — BASIC METABOLIC PANEL
BUN/Creatinine Ratio: 19 (ref 10–24)
BUN: 16 mg/dL (ref 8–27)
CO2: 20 mmol/L (ref 20–29)
Calcium: 8.9 mg/dL (ref 8.6–10.2)
Chloride: 105 mmol/L (ref 96–106)
Creatinine, Ser: 0.85 mg/dL (ref 0.76–1.27)
Glucose: 100 mg/dL — ABNORMAL HIGH (ref 65–99)
Potassium: 4.3 mmol/L (ref 3.5–5.2)
Sodium: 142 mmol/L (ref 134–144)
eGFR: 96 mL/min/{1.73_m2} (ref >59)

## 2020-12-18 NOTE — Telephone Encounter (Signed)
Patient came in for labs and wanted to Let clinicl know that the Dosage for Losartin he is currently taking is 25 MG

## 2020-12-20 NOTE — Telephone Encounter (Signed)
Patient stated that the nurse havent called in regards to his labs from 9/20. Please advise

## 2020-12-20 NOTE — Telephone Encounter (Signed)
Spoke to patient he stated he was unable to take Entresto,caused severe headache.Stated he only took for about 4 days.He stopped taking and restarted Losartan 25 mg daily.He wanted to make sure he was taking correct dose.He was also calling for recent lab results.Advised I will send message to Azalee Course PA for advice.

## 2020-12-25 NOTE — Telephone Encounter (Signed)
Called patient left message on personal voice mail to call back. 

## 2020-12-25 NOTE — Telephone Encounter (Signed)
Agree with restarting on the left previous losartan if the patient cannot tolerate the Entresto.

## 2020-12-26 NOTE — Telephone Encounter (Signed)
Patient was returning call 

## 2020-12-26 NOTE — Telephone Encounter (Signed)
Spoke with the pt and he says he is doing much better on the Losartan 25 mg and will keep track of his BP and let us know if he has any further problems or questions.   LM for Rosalita Chessman at [facility] cardiac rehab per the pt request since they have been missing eachother's calls and he is anxious to get started.

## 2021-01-09 DIAGNOSIS — E785 Hyperlipidemia, unspecified: Secondary | ICD-10-CM | POA: Diagnosis not present

## 2021-01-09 LAB — HEPATIC FUNCTION PANEL
ALT: 30 IU/L (ref 0–44)
AST: 23 IU/L (ref 0–40)
Albumin: 4.5 g/dL (ref 3.8–4.8)
Alkaline Phosphatase: 95 IU/L (ref 44–121)
Bilirubin Total: 0.8 mg/dL (ref 0.0–1.2)
Bilirubin, Direct: 0.24 mg/dL (ref 0.00–0.40)
Total Protein: 7 g/dL (ref 6.0–8.5)

## 2021-01-09 LAB — LIPID PANEL
Chol/HDL Ratio: 3 ratio (ref 0.0–5.0)
Cholesterol, Total: 124 mg/dL (ref 100–199)
HDL: 41 mg/dL (ref >39)
LDL Chol Calc (NIH): 70 mg/dL (ref 0–99)
Triglycerides: 59 mg/dL (ref 0–149)
VLDL Cholesterol Cal: 13 mg/dL (ref 5–40)

## 2021-01-16 ENCOUNTER — Encounter: Payer: BC Managed Care – PPO | Attending: Cardiovascular Disease | Admitting: *Deleted

## 2021-01-16 ENCOUNTER — Other Ambulatory Visit: Payer: Self-pay

## 2021-01-16 DIAGNOSIS — Z955 Presence of coronary angioplasty implant and graft: Secondary | ICD-10-CM

## 2021-01-16 DIAGNOSIS — I213 ST elevation (STEMI) myocardial infarction of unspecified site: Secondary | ICD-10-CM

## 2021-01-16 NOTE — Progress Notes (Signed)
Initial telephone orientation completed. Diagnosis can be found in [facility] 8/29. EP orientation scheduled for Thursday 11/3 at 3pm.

## 2021-01-31 ENCOUNTER — Encounter: Payer: BC Managed Care – PPO | Attending: Cardiovascular Disease

## 2021-01-31 ENCOUNTER — Other Ambulatory Visit: Payer: Self-pay

## 2021-01-31 VITALS — Ht 67.0 in | Wt 225.4 lb

## 2021-01-31 DIAGNOSIS — Z955 Presence of coronary angioplasty implant and graft: Secondary | ICD-10-CM | POA: Insufficient documentation

## 2021-01-31 DIAGNOSIS — I213 ST elevation (STEMI) myocardial infarction of unspecified site: Secondary | ICD-10-CM | POA: Diagnosis not present

## 2021-01-31 NOTE — Patient Instructions (Signed)
Patient Instructions  Patient Details  Name: Jose Edwards MRN: [id number] Date of Birth: 03-19-1956 Referring Provider:  Runell Gess, MD  Below are your personal goals for exercise, nutrition, and risk factors. Our goal is to help you stay on track towards obtaining and maintaining these goals. We will be discussing your progress on these goals with you throughout the program.  Initial Exercise Prescription:  Initial Exercise Prescription - 01/31/21 1600       Date of Initial Exercise RX and Referring Provider   Date 01/31/21    Referring Provider Allyson Sabal      Oxygen   Maintain Oxygen Saturation 88% or higher      Treadmill   MPH 2.8    Grade 1    Minutes 15    METs 3.6      Recumbant Bike   Level 3    RPM 60    Watts 51    Minutes 15    METs 3.6      NuStep   Level 3    SPM 80    Minutes 15    METs 3.6      REL-XR   Level 3    Speed 50    Minutes 15    METs 3.6      Prescription Details   Frequency (times per week) 3    Duration Progress to 30 minutes of continuous aerobic without signs/symptoms of physical distress      Intensity   THRR 40-80% of Max Heartrate 110-139    Ratings of Perceived Exertion 11-15    Perceived Dyspnea 0-4      Resistance Training   Training Prescription Yes    Weight 4 lb    Reps 10-15             Exercise Goals: Frequency: Be able to perform aerobic exercise two to three times per week in program working toward 2-5 days per week of home exercise.  Intensity: Work with a perceived exertion of 11 (fairly light) - 15 (hard) while following your exercise prescription.  We will make changes to your prescription with you as you progress through the program.   Duration: Be able to do 30 to 45 minutes of continuous aerobic exercise in addition to a 5 minute warm-up and a 5 minute cool-down routine.   Nutrition Goals: Your personal nutrition goals will be established when you do your nutrition analysis with the  dietician.  The following are general nutrition guidelines to follow: Cholesterol < 200mg /day Sodium < 1500mg /day Fiber: Men over 50 yrs - 30 grams per day  Personal Goals:  Personal Goals and Risk Factors at Admission - 01/31/21 1639       Core Components/Risk Factors/Patient Goals on Admission    Weight Management Yes    Intervention Weight Management/Obesity: Establish reasonable short term and long term weight goals.    Admit Weight 225 lb 6.4 oz (102.2 kg)    Goal Weight: Short Term 220 lb (99.8 kg)    Goal Weight: Long Term 215 lb (97.5 kg)    Expected Outcomes Short Term: Continue to assess and modify interventions until short term weight is achieved;Long Term: Adherence to nutrition and physical activity/exercise program aimed toward attainment of established weight goal;Weight Loss: Understanding of general recommendations for a balanced deficit meal plan, which promotes 1-2 lb weight loss per week and includes a negative energy balance of [phone] kcal/d    Lipids Yes    Intervention Provide education  and support for participant on nutrition & aerobic/resistive exercise along with prescribed medications to achieve LDL 70mg , HDL >40mg .    Expected Outcomes Short Term: Participant states understanding of desired cholesterol values and is compliant with medications prescribed. Participant is following exercise prescription and nutrition guidelines.;Long Term: Cholesterol controlled with medications as prescribed, with individualized exercise RX and with personalized nutrition plan. Value goals: LDL < 70mg , HDL > 40 mg.             Tobacco Use Initial Evaluation: Social History   Tobacco Use  Smoking Status Never  Smokeless Tobacco Never    Exercise Goals and Review:  Exercise Goals     Row Name 01/31/21 1634             Exercise Goals   Increase Physical Activity Yes       Intervention Provide advice, education, support and counseling about physical  activity/exercise needs.;Develop an individualized exercise prescription for aerobic and resistive training based on initial evaluation findings, risk stratification, comorbidities and participant's personal goals.       Expected Outcomes Short Term: Attend rehab on a regular basis to increase amount of physical activity.;Long Term: Add in home exercise to make exercise part of routine and to increase amount of physical activity.;Long Term: Exercising regularly at least 3-5 days a week.       Increase Strength and Stamina Yes       Intervention Provide advice, education, support and counseling about physical activity/exercise needs.;Develop an individualized exercise prescription for aerobic and resistive training based on initial evaluation findings, risk stratification, comorbidities and participant's personal goals.       Expected Outcomes Short Term: Increase workloads from initial exercise prescription for resistance, speed, and METs.;Short Term: Perform resistance training exercises routinely during rehab and add in resistance training at home;Long Term: Improve cardiorespiratory fitness, muscular endurance and strength as measured by increased METs and functional capacity (13/03/22)       Able to understand and use rate of perceived exertion (RPE) scale Yes       Intervention Provide education and explanation on how to use RPE scale       Expected Outcomes Short Term: Able to use RPE daily in rehab to express subjective intensity level;Long Term:  Able to use RPE to guide intensity level when exercising independently       Able to understand and use Dyspnea scale Yes       Intervention Provide education and explanation on how to use Dyspnea scale       Expected Outcomes Short Term: Able to use Dyspnea scale daily in rehab to express subjective sense of shortness of breath during exertion;Long Term: Able to use Dyspnea scale to guide intensity level when exercising independently       Knowledge and  understanding of Target Heart Rate Range (THRR) Yes       Intervention Provide education and explanation of THRR including how the numbers were predicted and where they are located for reference       Expected Outcomes Short Term: Able to state/look up THRR;Short Term: Able to use daily as guideline for intensity in rehab;Long Term: Able to use THRR to govern intensity when exercising independently       Able to check pulse independently Yes       Intervention Provide education and demonstration on how to check pulse in carotid and radial arteries.;Review the importance of being able to check your own pulse for safety during independent exercise  Expected Outcomes Short Term: Able to explain why pulse checking is important during independent exercise;Long Term: Able to check pulse independently and accurately       Understanding of Exercise Prescription Yes       Intervention Provide education, explanation, and written materials on patient's individual exercise prescription       Expected Outcomes Short Term: Able to explain program exercise prescription;Long Term: Able to explain home exercise prescription to exercise independently                Copy of goals given to participant.

## 2021-01-31 NOTE — Progress Notes (Signed)
Cardiac Individual Treatment Plan  Patient Details  Name: Jose Edwards MRN: [id number] Date of Birth: 1955-09-30 Referring Provider:   Flowsheet Row Cardiac Rehab from 01/31/2021 in [facility] Cardiac and Pulmonary Rehab  Referring Provider Gwenlyn Found       Initial Encounter Date:  Flowsheet Row Cardiac Rehab from 01/31/2021 in [facility] Cardiac and Pulmonary Rehab  Date 01/31/21       Visit Diagnosis: ST elevation myocardial infarction (STEMI), unspecified artery ([place])  Status post coronary artery stent placement  Patient's Home Medications on Admission:  Current Outpatient Medications:    aspirin 81 MG EC tablet, Take 1 tablet (81 mg total) by mouth daily. Swallow whole., Disp: 90 tablet, Rfl: 3   atorvastatin (LIPITOR) 80 MG tablet, Take 1 tablet (80 mg total) by mouth daily., Disp: 90 tablet, Rfl: 3   benzonatate (TESSALON PERLES) 100 MG capsule, 1-2 capsules every  8 hours as needed for cough (Patient taking differently: 1-2 capsules every  8 hours as needed for cough), Disp: 30 capsule, Rfl: 0   Cholecalciferol (VITAMIN D3) 50 MCG (2000 UT) TABS, Take 2,000 Units by mouth 2 (two) times a week., Disp: , Rfl:    clopidogrel (PLAVIX) 75 MG tablet, Take 8 tablets (600 mg total) by mouth daily for 1 day, THEN 1 tablet (75 mg total) daily., Disp: 98 tablet, Rfl: 3   dapagliflozin propanediol (FARXIGA) 10 MG TABS tablet, Take 1 tablet (10 mg total) by mouth daily before breakfast., Disp: 30 tablet, Rfl: 11   fluticasone (FLONASE) 50 MCG/ACT nasal spray, Place 2 sprays into both nostrils daily., Disp: 16 g, Rfl: 2   losartan (COZAAR) 25 MG tablet, Take 1 tablet (25 mg total) by mouth daily., Disp: 90 tablet, Rfl: 3   metoprolol succinate (TOPROL-XL) 50 MG 24 hr tablet, Take 1 tablet (50 mg total) by mouth daily. Take with or immediately following a meal., Disp: 30 tablet, Rfl: 11   nitroGLYCERIN (NITROSTAT) 0.4 MG SL tablet, Place 1 tablet (0.4 mg total) under the tongue every 5 (five) minutes  as needed for chest pain., Disp: 25 tablet, Rfl: 1   omeprazole (PRILOSEC OTC) 20 MG tablet, Take 20 mg by mouth daily as needed (for heartburn)., Disp: , Rfl:    vitamin B-12 (CYANOCOBALAMIN) 100 MCG tablet, Take 100 mcg by mouth every other day. , Disp: , Rfl:    vitamin C (ASCORBIC ACID) 500 MG tablet, Take 500-1,000 mg by mouth daily., Disp: , Rfl:   Past Medical History: Past Medical History:  Diagnosis Date   Acute ST elevation myocardial infarction (STEMI) of anterolateral wall ([place]) 11/26/2020   100% D1 - DES PCI 2.25 x 16 (2.4 mm)   Benign positional vertigo    CAD S/P DES PCI D1  11/27/2020   100% D1 (DES PCI with Onyx Frontier DES 2.25 x 18 - deployed to 2.4 mm)   Coronary artery disease involving native coronary artery of native heart with angina pectoris ([place]) 11/26/2020   8/29 ([place] STEMI): Cath => Culprit lesion-100% D1 (DES PCI with Onyx Frontier DES 2.25 x 18 - deployed to 2.4 mm). Mid LM 30%, Ost-prox LAD 30% & RI 30%.     ED (erectile dysfunction)    GERD (gastroesophageal reflux disease)    Osteoarthrosis, unspecified whether generalized or localized, unspecified site     Tobacco Use: Social History   Tobacco Use  Smoking Status Never  Smokeless Tobacco Never    Labs: Recent Review Flowsheet Data     Labs for  ITP Cardiac and Pulmonary Rehab Latest Ref Rng & Units 02/27/2010 06/21/2013 05/21/2016 11/26/2020 01/09/2021   Cholestrol 100 - 199 mg/dL 045(W) 098(J) 191 478 295   LDLCALC 0 - 99 mg/dL - 621 308(M) 578(I) 70   LDLDIRECT mg/dL 696.2 - - - -   HDL >95 mg/dL 28.41 - 32.44 01(U) 41   Trlycerides 0 - 149 mg/dL 272.0(H) - 88.0 132 59   Hemoglobin A1c 4.8 - 5.6 % - - - 5.6 -        Exercise Target Goals: Exercise Program Goal: Individual exercise prescription set using results from initial 6 min walk test and THRR while considering  patient's activity barriers and safety.   Exercise Prescription Goal: Initial exercise prescription builds to 30-45  minutes a day of aerobic activity, 2-3 days per week.  Home exercise guidelines will be given to patient during program as part of exercise prescription that the participant will acknowledge.   Education: Aerobic Exercise: - Group verbal and visual presentation on the components of exercise prescription. Introduces F.I.T.T principle from ACSM for exercise prescriptions.  Reviews F.I.T.T. principles of aerobic exercise including progression. Written material given at graduation.   Education: Resistance Exercise: - Group verbal and visual presentation on the components of exercise prescription. Introduces F.I.T.T principle from ACSM for exercise prescriptions  Reviews F.I.T.T. principles of resistance exercise including progression. Written material given at graduation.    Education: Exercise & Equipment Safety: - Individual verbal instruction and demonstration of equipment use and safety with use of the equipment. Flowsheet Row Cardiac Rehab from 01/31/2021 in [facility] Cardiac and Pulmonary Rehab  Date 01/31/21  Educator AS  Instruction Review Code 1- Verbalizes Understanding       Education: Exercise Physiology & General Exercise Guidelines: - Group verbal and written instruction with models to review the exercise physiology of the cardiovascular system and associated critical values. Provides general exercise guidelines with specific guidelines to those with heart or lung disease.    Education: Flexibility, Balance, Mind/Body Relaxation: - Group verbal and visual presentation with interactive activity on the components of exercise prescription. Introduces F.I.T.T principle from ACSM for exercise prescriptions. Reviews F.I.T.T. principles of flexibility and balance exercise training including progression. Also discusses the mind body connection.  Reviews various relaxation techniques to help reduce and manage stress (i.e. Deep breathing, progressive muscle relaxation, and visualization). Balance  handout provided to take home. Written material given at graduation.   Activity Barriers & Risk Stratification:  Activity Barriers & Cardiac Risk Stratification - 01/16/21 1416       Activity Barriers & Cardiac Risk Stratification   Activity Barriers Other (comment)    Comments left ankle has screws    Cardiac Risk Stratification Moderate             6 Minute Walk:  6 Minute Walk     Row Name 01/31/21 1627         6 Minute Walk   Phase Initial     Distance 1485 feet     Walk Time 6 minutes     # of Rest Breaks 0     MPH 2.8     METS 3.6     RPE 11     Perceived Dyspnea  1     VO2 Peak 12.47     Symptoms No     Resting HR 80 bpm     Resting BP 118/60     Resting Oxygen Saturation  97 %     Exercise  Oxygen Saturation  during 6 min walk 97 %     Max Ex. HR 110 bpm     Max Ex. BP 190/76     2 Minute Post BP 108/54              Oxygen Initial Assessment:   Oxygen Re-Evaluation:   Oxygen Discharge (Final Oxygen Re-Evaluation):   Initial Exercise Prescription:  Initial Exercise Prescription - 01/31/21 1600       Date of Initial Exercise RX and Referring Provider   Date 01/31/21    Referring Provider Allyson Sabal      Oxygen   Maintain Oxygen Saturation 88% or higher      Treadmill   MPH 2.8    Grade 1    Minutes 15    METs 3.6      Recumbant Bike   Level 3    RPM 60    Watts 51    Minutes 15    METs 3.6      NuStep   Level 3    SPM 80    Minutes 15    METs 3.6      REL-XR   Level 3    Speed 50    Minutes 15    METs 3.6      Prescription Details   Frequency (times per week) 3    Duration Progress to 30 minutes of continuous aerobic without signs/symptoms of physical distress      Intensity   THRR 40-80% of Max Heartrate 110-139    Ratings of Perceived Exertion 11-15    Perceived Dyspnea 0-4      Resistance Training   Training Prescription Yes    Weight 4 lb    Reps 10-15             Perform Capillary Blood Glucose  checks as needed.  Exercise Prescription Changes:   Exercise Prescription Changes     Row Name 01/31/21 1600             Response to Exercise   Blood Pressure (Admit) 118/60       Blood Pressure (Exercise) 190/76       Blood Pressure (Exit) 108/54       Heart Rate (Admit) 80 bpm       Heart Rate (Exercise) 110 bpm       Heart Rate (Exit) 80 bpm       Oxygen Saturation (Admit) 97 %       Oxygen Saturation (Exercise) 97 %       Rating of Perceived Exertion (Exercise) 11       Perceived Dyspnea (Exercise) 1       Symptoms none                Exercise Comments:   Exercise Goals and Review:   Exercise Goals     Row Name 01/31/21 1634             Exercise Goals   Increase Physical Activity Yes       Intervention Provide advice, education, support and counseling about physical activity/exercise needs.;Develop an individualized exercise prescription for aerobic and resistive training based on initial evaluation findings, risk stratification, comorbidities and participant's personal goals.       Expected Outcomes Short Term: Attend rehab on a regular basis to increase amount of physical activity.;Long Term: Add in home exercise to make exercise part of routine and to increase amount of physical activity.;Long Term: Exercising regularly at least  3-5 days a week.       Increase Strength and Stamina Yes       Intervention Provide advice, education, support and counseling about physical activity/exercise needs.;Develop an individualized exercise prescription for aerobic and resistive training based on initial evaluation findings, risk stratification, comorbidities and participant's personal goals.       Expected Outcomes Short Term: Increase workloads from initial exercise prescription for resistance, speed, and METs.;Short Term: Perform resistance training exercises routinely during rehab and add in resistance training at home;Long Term: Improve cardiorespiratory fitness,  muscular endurance and strength as measured by increased METs and functional capacity ( )       Able to understand and use rate of perceived exertion (RPE) scale Yes       Intervention Provide education and explanation on how to use RPE scale       Expected Outcomes Short Term: Able to use RPE daily in rehab to express subjective intensity level;Long Term:  Able to use RPE to guide intensity level when exercising independently       Able to understand and use Dyspnea scale Yes       Intervention Provide education and explanation on how to use Dyspnea scale       Expected Outcomes Short Term: Able to use Dyspnea scale daily in rehab to express subjective sense of shortness of breath during exertion;Long Term: Able to use Dyspnea scale to guide intensity level when exercising independently       Knowledge and understanding of Target Heart Rate Range (THRR) Yes       Intervention Provide education and explanation of THRR including how the numbers were predicted and where they are located for reference       Expected Outcomes Short Term: Able to state/look up THRR;Short Term: Able to use daily as guideline for intensity in rehab;Long Term: Able to use THRR to govern intensity when exercising independently       Able to check pulse independently Yes       Intervention Provide education and demonstration on how to check pulse in carotid and radial arteries.;Review the importance of being able to check your own pulse for safety during independent exercise       Expected Outcomes Short Term: Able to explain why pulse checking is important during independent exercise;Long Term: Able to check pulse independently and accurately       Understanding of Exercise Prescription Yes       Intervention Provide education, explanation, and written materials on patient's individual exercise prescription       Expected Outcomes Short Term: Able to explain program exercise prescription;Long Term: Able to explain home  exercise prescription to exercise independently                Exercise Goals Re-Evaluation :   Discharge Exercise Prescription (Final Exercise Prescription Changes):  Exercise Prescription Changes - 01/31/21 1600       Response to Exercise   Blood Pressure (Admit) 118/60    Blood Pressure (Exercise) 190/76    Blood Pressure (Exit) 108/54    Heart Rate (Admit) 80 bpm    Heart Rate (Exercise) 110 bpm    Heart Rate (Exit) 80 bpm    Oxygen Saturation (Admit) 97 %    Oxygen Saturation (Exercise) 97 %    Rating of Perceived Exertion (Exercise) 11    Perceived Dyspnea (Exercise) 1    Symptoms none             Nutrition:  Target Goals: Understanding of nutrition guidelines, daily intake of sodium 1500mg , cholesterol 200mg , calories 30% from fat and 7% or less from saturated fats, daily to have 5 or more servings of fruits and vegetables.  Education: All About Nutrition: -Group instruction provided by verbal, written material, interactive activities, discussions, models, and posters to present general guidelines for heart healthy nutrition including fat, fiber, MyPlate, the role of sodium in heart healthy nutrition, utilization of the nutrition label, and utilization of this knowledge for meal planning. Follow up email sent as well. Written material given at graduation. Flowsheet Row Cardiac Rehab from 01/31/2021 in [facility] Cardiac and Pulmonary Rehab  Education need identified 01/31/21       Biometrics:  Pre Biometrics - 01/31/21 1635       Pre Biometrics   Height 5\' 7"  (1.702 m)    Weight 225 lb 6.4 oz (102.2 kg)    BMI (Calculated) 35.29    Single Leg Stand 20 seconds              Nutrition Therapy Plan and Nutrition Goals:   Nutrition Assessments:  MEDIFICTS Score Key: ?70 Need to make dietary changes  40-70 Heart Healthy Diet ? 40 Therapeutic Level Cholesterol Diet  Flowsheet Row Cardiac Rehab from 01/31/2021 in [facility] Cardiac and Pulmonary Rehab   Picture Your Plate Total Score on Admission 63      Picture Your Plate Scores: 64 Unhealthy dietary pattern with much room for improvement. 41-50 Dietary pattern unlikely to meet recommendations for good health and room for improvement. 51-60 More healthful dietary pattern, with some room for improvement.  >60 Healthy dietary pattern, although there may be some specific behaviors that could be improved.    Nutrition Goals Re-Evaluation:   Nutrition Goals Discharge (Final Nutrition Goals Re-Evaluation):   Psychosocial: Target Goals: Acknowledge presence or absence of significant depression and/or stress, maximize coping skills, provide positive support system. Participant is able to verbalize types and ability to use techniques and skills needed for reducing stress and depression.   Education: Stress, Anxiety, and Depression - Group verbal and visual presentation to define topics covered.  Reviews how body is impacted by stress, anxiety, and depression.  Also discusses healthy ways to reduce stress and to treat/manage anxiety and depression.  Written material given at graduation.   Education: Sleep Hygiene -Provides group verbal and written instruction about how sleep can affect your health.  Define sleep hygiene, discuss sleep cycles and impact of sleep habits. Review good sleep hygiene tips.    Initial Review & Psychosocial Screening:  Initial Psych Review & Screening - 01/16/21 1419       Initial Review   Current issues with None Identified      Family Dynamics   Good Support System? Yes   wife, family, coworkers     Barriers   Psychosocial barriers to participate in program There are no identifiable barriers or psychosocial needs.;The patient should benefit from training in stress management and relaxation.      Screening Interventions   Interventions Encouraged to exercise;Provide feedback about the scores to participant;To provide support and resources with  identified psychosocial needs    Expected Outcomes Short Term goal: Utilizing psychosocial counselor, staff and physician to assist with identification of specific Stressors or current issues interfering with healing process. Setting desired goal for each stressor or current issue identified.;Long Term Goal: Stressors or current issues are controlled or eliminated.;Short Term goal: Identification and review with participant of any Quality of Life or Depression  concerns found by scoring the questionnaire.;Long Term goal: The participant improves quality of Life and PHQ9 Scores as seen by post scores and/or verbalization of changes             Quality of Life Scores:   Quality of Life - 01/31/21 1640       Quality of Life   Select Quality of Life      Quality of Life Scores   Health/Function Pre 21.2 %    Socioeconomic Pre 24.43 %    Psych/Spiritual Pre 22.86 %    Family Pre 25.2 %    GLOBAL Pre 22.79 %            Scores of 19 and below usually indicate a poorer quality of life in these areas.  A difference of  2-3 points is a clinically meaningful difference.  A difference of 2-3 points in the total score of the Quality of Life Index has been associated with significant improvement in overall quality of life, self-image, physical symptoms, and general health in studies assessing change in quality of life.  PHQ-9: Recent Review Flowsheet Data     Depression screen [facility] 2/9 01/31/2021   Decreased Interest 0   Down, Depressed, Hopeless 0   PHQ - 2 Score 0   Altered sleeping 1   Tired, decreased energy 0   Change in appetite 0   Feeling bad or failure about yourself  0   Trouble concentrating 0   Moving slowly or fidgety/restless 0   Suicidal thoughts 0   PHQ-9 Score 1   Difficult doing work/chores Not difficult at all      Interpretation of Total Score  Total Score Depression Severity:  1-4 = Minimal depression, 5-9 = Mild depression, 10-14 = Moderate depression, 15-19 =  Moderately severe depression, 20-27 = Severe depression   Psychosocial Evaluation and Intervention:  Psychosocial Evaluation - 01/16/21 1427       Psychosocial Evaluation & Interventions   Interventions Encouraged to exercise with the program and follow exercise prescription    Comments Mazi reports 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 He does struggle with some fatigue but thinks its coming from his medications. Prior to his MI, he was on vitamins and GERD medicine, so a lot of these are new to him. He has been back to work full time at [employer] for 2 weeks now, still on light duty in the shop, and reports he is feeling well. He does not report any stress concerns. His sleep issues are not new, but some nights it is difficult to go to sleep. He has a great support system at home and at work. He is very motivated to make the changes needed to have a heart healthy lifestyle.    Expected Outcomes Short: attend cardiac rehab for education and exercise. Long: develop and maintain positive self care habits.    Continue Psychosocial Services  Follow up required by staff             Psychosocial Re-Evaluation:   Psychosocial Discharge (Final Psychosocial Re-Evaluation):   Vocational Rehabilitation: Provide vocational rehab assistance to qualifying candidates.   Vocational Rehab Evaluation & Intervention:  Vocational Rehab - 01/16/21 1419       Initial Vocational Rehab Evaluation & Intervention   Assessment shows need for Vocational Rehabilitation No             Education: Education Goals: Education classes will be provided on a variety of topics geared toward better understanding of heart  health and risk factor modification. Participant will state understanding/return demonstration of topics presented as noted by education test scores.  Learning Barriers/Preferences:  Learning Barriers/Preferences - 01/16/21 1419       Learning Barriers/Preferences   Learning Barriers None    Learning  Preferences None             General Cardiac Education Topics:  AED/CPR: - Group verbal and written instruction with the use of models to demonstrate the basic use of the AED with the basic ABC's of resuscitation.   Anatomy and Cardiac Procedures: - Group verbal and visual presentation and models provide information about basic cardiac anatomy and function. Reviews the testing methods done to diagnose heart disease and the outcomes of the test results. Describes the treatment choices: Medical Management, Angioplasty, or Coronary Bypass Surgery for treating various heart conditions including Myocardial Infarction, Angina, Valve Disease, and Cardiac Arrhythmias.  Written material given at graduation.   Medication Safety: - Group verbal and visual instruction to review commonly prescribed medications for heart and lung disease. Reviews the medication, class of the drug, and side effects. Includes the steps to properly store meds and maintain the prescription regimen.  Written material given at graduation.   Intimacy: - Group verbal instruction through game format to discuss how heart and lung disease can affect sexual intimacy. Written material given at graduation..   Know Your Numbers and Heart Failure: - Group verbal and visual instruction to discuss disease risk factors for cardiac and pulmonary disease and treatment options.  Reviews associated critical values for Overweight/Obesity, Hypertension, Cholesterol, and Diabetes.  Discusses basics of heart failure: signs/symptoms and treatments.  Introduces Heart Failure Zone chart for action plan for heart failure.  Written material given at graduation. Flowsheet Row Cardiac Rehab from 01/31/2021 in [facility] Cardiac and Pulmonary Rehab  Education need identified 01/31/21       Infection Prevention: - Provides verbal and written material to individual with discussion of infection control including proper hand washing and proper equipment  cleaning during exercise session. Flowsheet Row Cardiac Rehab from 01/31/2021 in [facility] Cardiac and Pulmonary Rehab  Date 01/31/21  Educator AS  Instruction Review Code 1- Verbalizes Understanding       Falls Prevention: - Provides verbal and written material to individual with discussion of falls prevention and safety. Flowsheet Row Cardiac Rehab from 01/31/2021 in [facility] Cardiac and Pulmonary Rehab  Date 01/31/21  Educator AS  Instruction Review Code 1- Verbalizes Understanding       Other: -Provides group and verbal instruction on various topics (see comments)   Knowledge Questionnaire Score:  Knowledge Questionnaire Score - 01/31/21 1636       Knowledge Questionnaire Score   Pre Score 23/26             Core Components/Risk Factors/Patient Goals at Admission:  Personal Goals and Risk Factors at Admission - 01/31/21 1639       Core Components/Risk Factors/Patient Goals on Admission    Weight Management Yes    Intervention Weight Management/Obesity: Establish reasonable short term and long term weight goals.    Admit Weight 225 lb 6.4 oz (102.2 kg)    Goal Weight: Short Term 220 lb (99.8 kg)    Goal Weight: Long Term 215 lb (97.5 kg)    Expected Outcomes Short Term: Continue to assess and modify interventions until short term weight is achieved;Long Term: Adherence to nutrition and physical activity/exercise program aimed toward attainment of established weight goal;Weight Loss: Understanding of general recommendations for  a balanced deficit meal plan, which promotes 1-2 lb weight loss per week and includes a negative energy balance of [phone] kcal/d    Lipids Yes    Intervention Provide education and support for participant on nutrition & aerobic/resistive exercise along with prescribed medications to achieve LDL 70mg , HDL >40mg .    Expected Outcomes Short Term: Participant states understanding of desired cholesterol values and is compliant with medications prescribed.  Participant is following exercise prescription and nutrition guidelines.;Long Term: Cholesterol controlled with medications as prescribed, with individualized exercise RX and with personalized nutrition plan. Value goals: LDL < , HDL > 40 mg.             Education:Diabetes - Individual verbal and written instruction to review signs/symptoms of diabetes, desired ranges of glucose level fasting, after meals and with exercise. Acknowledge that pre and post exercise glucose checks will be done for 3 sessions at entry of program.   Core Components/Risk Factors/Patient Goals Review:    Core Components/Risk Factors/Patient Goals at Discharge (Final Review):    ITP Comments:  ITP Comments     Row Name 01/16/21 1403           ITP Comments Initial telephone orientation completed. Diagnosis can be found in [facility] 8/29. EP orientation scheduled for Thursday 11/3 at 3pm.                Comments: initial ITP

## 2021-02-04 ENCOUNTER — Encounter: Payer: BC Managed Care – PPO | Admitting: *Deleted

## 2021-02-04 ENCOUNTER — Other Ambulatory Visit: Payer: Self-pay

## 2021-02-04 DIAGNOSIS — I213 ST elevation (STEMI) myocardial infarction of unspecified site: Secondary | ICD-10-CM

## 2021-02-04 DIAGNOSIS — Z955 Presence of coronary angioplasty implant and graft: Secondary | ICD-10-CM | POA: Diagnosis not present

## 2021-02-04 NOTE — Progress Notes (Signed)
Daily Session Note  Patient Details  Name: Jose Edwards MRN: [id number] Date of Birth: [birth year] Referring Provider:   Flowsheet Row Cardiac Rehab from 01/31/2021 in [facility] Cardiac and Pulmonary Rehab  Referring Provider Gwenlyn Found       Encounter Date: 02/04/2021  Check In:  Session Check In - 02/04/21 1659       Check-In   Supervising physician immediately available to respond to emergencies See telemetry face sheet for immediately available ER MD    Location [facility]-Cardiac & Pulmonary Rehab    Staff Present Renita Papa, RN Margurite Auerbach, MS, ASCM CEP, Exercise Physiologist;Joseph Tessie Fass, [place]    Virtual Visit No    Medication changes reported     No    Fall or balance concerns reported    No    Warm-up and Cool-down Performed on first and last piece of equipment    Resistance Training Performed Yes    VAD Patient? No    PAD/SET Patient? No      Pain Assessment   Currently in Pain? No/denies                Social History   Tobacco Use  Smoking Status Never  Smokeless Tobacco Never    Goals Met:  Independence with exercise equipment Exercise tolerated well No report of concerns or symptoms today Strength training completed today  Goals Unmet:  Not Applicable  Comments: First full day of exercise!  Patient was oriented to gym and equipment including functions, settings, policies, and procedures.  Patient's individual exercise prescription and treatment plan were reviewed.  All starting workloads were established based on the results of the 6 minute walk test done at initial orientation visit.  The plan for exercise progression was also introduced and progression will be customized based on patient's performance and goals.     Dr. Emily Filbert is Medical Director for [place].  Dr. Ottie Glazier is Medical Director for [facility] Pulmonary Rehabilitation.

## 2021-02-06 ENCOUNTER — Encounter: Payer: BC Managed Care – PPO | Admitting: *Deleted

## 2021-02-06 ENCOUNTER — Other Ambulatory Visit: Payer: Self-pay

## 2021-02-06 DIAGNOSIS — I213 ST elevation (STEMI) myocardial infarction of unspecified site: Secondary | ICD-10-CM | POA: Diagnosis not present

## 2021-02-06 DIAGNOSIS — Z955 Presence of coronary angioplasty implant and graft: Secondary | ICD-10-CM

## 2021-02-06 NOTE — Progress Notes (Signed)
Daily Session Note  Patient Details  Name: Jose Edwards MRN: [id number] Date of Birth: Dec 07, 1955 Referring Provider:   Flowsheet Row Cardiac Rehab from 01/31/2021 in [facility] Cardiac and Pulmonary Rehab  Referring Provider Gwenlyn Found       Encounter Date: 02/06/2021  Check In:  Session Check In - 02/06/21 1648       Check-In   Supervising physician immediately available to respond to emergencies See telemetry face sheet for immediately available ER MD    Location [facility]Cardiac & Pulmonary Rehab    Staff Present Renita Papa, RN Margurite Auerbach, MS, ASCM CEP, Exercise Physiologist;Joseph Tessie Fass, [place]    Virtual Visit No    Medication changes reported     No    Fall or balance concerns reported    No    Warm-up and Cool-down Performed on first and last piece of equipment    Resistance Training Performed Yes    VAD Patient? No    PAD/SET Patient? No      Pain Assessment   Currently in Pain? No/denies                Social History   Tobacco Use  Smoking Status Never  Smokeless Tobacco Never    Goals Met:  Independence with exercise equipment Exercise tolerated well No report of concerns or symptoms today Strength training completed today  Goals Unmet:  Not Applicable  Comments: Pt able to follow exercise prescription today without complaint.  Will continue to monitor for progression.    Dr. Emily Filbert is Medical Director for [place].  Dr. Ottie Glazier is Medical Director for [facility] Pulmonary Rehabilitation.

## 2021-02-07 ENCOUNTER — Encounter: Payer: BC Managed Care – PPO | Admitting: *Deleted

## 2021-02-07 DIAGNOSIS — Z955 Presence of coronary angioplasty implant and graft: Secondary | ICD-10-CM

## 2021-02-07 DIAGNOSIS — I213 ST elevation (STEMI) myocardial infarction of unspecified site: Secondary | ICD-10-CM | POA: Diagnosis not present

## 2021-02-07 NOTE — Progress Notes (Signed)
Daily Session Note  Patient Details  Name: Thayer Embleton MRN: [id number] Date of Birth: 12/15/1955 Referring Provider:   Flowsheet Row Cardiac Rehab from 01/31/2021 in [facility] Cardiac and Pulmonary Rehab  Referring Provider Gwenlyn Found       Encounter Date: 02/07/2021  Check In:  Session Check In - 02/07/21 [place]       Check-In   Supervising physician immediately available to respond to emergencies See telemetry face sheet for immediately available ER MD    Location [facility]Cardiac & Pulmonary Rehab    Staff Present Renita Papa, RN BSN;Joseph Tessie Fass, RCP,RRT,BSRT;Kara [place], [place], ASCM CEP, Exercise Physiologist    Virtual Visit No    Medication changes reported     No    Fall or balance concerns reported    No    Warm-up and Cool-down Performed on first and last piece of equipment    Resistance Training Performed Yes    VAD Patient? No    PAD/SET Patient? No      Pain Assessment   Currently in Pain? No/denies                Social History   Tobacco Use  Smoking Status Never  Smokeless Tobacco Never    Goals Met:  Independence with exercise equipment Exercise tolerated well No report of concerns or symptoms today Strength training completed today  Goals Unmet:  Not Applicable  Comments: Pt able to follow exercise prescription today without complaint.  Will continue to monitor for progression.    Dr. Emily Filbert is Medical Director for [place].  Dr. Ottie Glazier is Medical Director for [facility] Pulmonary Rehabilitation.

## 2021-02-11 ENCOUNTER — Encounter: Payer: BC Managed Care – PPO | Admitting: *Deleted

## 2021-02-11 ENCOUNTER — Other Ambulatory Visit: Payer: Self-pay

## 2021-02-11 DIAGNOSIS — I213 ST elevation (STEMI) myocardial infarction of unspecified site: Secondary | ICD-10-CM | POA: Diagnosis not present

## 2021-02-11 DIAGNOSIS — Z955 Presence of coronary angioplasty implant and graft: Secondary | ICD-10-CM

## 2021-02-11 NOTE — Progress Notes (Signed)
Daily Session Note  Patient Details  Name: Koty Anctil MRN: [id number] Date of Birth: 1955/07/03 Referring Provider:   Flowsheet Row Cardiac Rehab from 01/31/2021 in [facility] Cardiac and Pulmonary Rehab  Referring Provider Gwenlyn Found       Encounter Date: 02/11/2021  Check In:  Session Check In - 02/11/21 1659       Check-In   Supervising physician immediately available to respond to emergencies See telemetry face sheet for immediately available ER MD    Location [facility]Cardiac & Pulmonary Rehab    Staff Present Renita Papa, RN Margurite Auerbach, MS, ASCM CEP, Exercise Physiologist;Joseph Tessie Fass, [place]    Virtual Visit No    Medication changes reported     No    Fall or balance concerns reported    No    Warm-up and Cool-down Performed on first and last piece of equipment    Resistance Training Performed Yes    VAD Patient? No    PAD/SET Patient? No      Pain Assessment   Currently in Pain? No/denies                Social History   Tobacco Use  Smoking Status Never  Smokeless Tobacco Never    Goals Met:  Independence with exercise equipment Exercise tolerated well No report of concerns or symptoms today Strength training completed today  Goals Unmet:  Not Applicable  Comments: Pt able to follow exercise prescription today without complaint.  Will continue to monitor for progression.    Dr. Emily Filbert is Medical Director for [place].  Dr. Ottie Glazier is Medical Director for [facility] Pulmonary Rehabilitation.

## 2021-02-13 ENCOUNTER — Encounter: Payer: BC Managed Care – PPO | Admitting: *Deleted

## 2021-02-13 ENCOUNTER — Other Ambulatory Visit: Payer: Self-pay

## 2021-02-13 DIAGNOSIS — Z955 Presence of coronary angioplasty implant and graft: Secondary | ICD-10-CM | POA: Diagnosis not present

## 2021-02-13 DIAGNOSIS — I213 ST elevation (STEMI) myocardial infarction of unspecified site: Secondary | ICD-10-CM | POA: Diagnosis not present

## 2021-02-13 NOTE — Progress Notes (Signed)
Daily Session Note  Patient Details  Name: Jose Edwards MRN: [id number] Date of Birth: [birth year] Referring Provider:   Flowsheet Row Cardiac Rehab from 01/31/2021 in [facility] Cardiac and Pulmonary Rehab  Referring Provider Gwenlyn Found       Encounter Date: 02/13/2021  Check In:  Session Check In - 02/13/21 1632       Check-In   Supervising physician immediately available to respond to emergencies See telemetry face sheet for immediately available ER MD    Location [facility]-Cardiac & Pulmonary Rehab    Staff Present Renita Papa, RN BSN;Joseph [place], RCP,RRT,BSRT;Kara [place], [place], ASCM CEP, Exercise Physiologist    Virtual Visit No    Medication changes reported     No    Fall or balance concerns reported    No    Warm-up and Cool-down Performed on first and last piece of equipment    Resistance Training Performed Yes    VAD Patient? No    PAD/SET Patient? No      Pain Assessment   Currently in Pain? No/denies                Social History   Tobacco Use  Smoking Status Never  Smokeless Tobacco Never    Goals Met:  Independence with exercise equipment Exercise tolerated well No report of concerns or symptoms today Strength training completed today  Goals Unmet:  Not Applicable  Comments: Pt able to follow exercise prescription today without complaint.  Will continue to monitor for progression.    Dr. Emily Filbert is Medical Director for [place].  Dr. Ottie Glazier is Medical Director for [facility] Pulmonary Rehabilitation.

## 2021-02-14 ENCOUNTER — Encounter: Payer: BC Managed Care – PPO | Admitting: *Deleted

## 2021-02-14 DIAGNOSIS — Z955 Presence of coronary angioplasty implant and graft: Secondary | ICD-10-CM | POA: Diagnosis not present

## 2021-02-14 DIAGNOSIS — I213 ST elevation (STEMI) myocardial infarction of unspecified site: Secondary | ICD-10-CM

## 2021-02-14 NOTE — Progress Notes (Signed)
Daily Session Note  Patient Details  Name: Jose Edwards MRN: [id number] Date of Birth: Feb 18, 1956 Referring Provider:   Flowsheet Row Cardiac Rehab from 01/31/2021 in [facility] Cardiac and Pulmonary Rehab  Referring Provider Gwenlyn Found       Encounter Date: 02/14/2021  Check In:  Session Check In - 02/14/21 1707       Check-In   Supervising physician immediately available to respond to emergencies See telemetry face sheet for immediately available ER MD    Location [facility]-Cardiac & Pulmonary Rehab    Staff Present Renita Papa, RN BSN;Joseph Tessie Fass, RCP,RRT,BSRT;Kara [place], MS, ASCM CEP, Exercise Physiologist    Virtual Visit No    Medication changes reported     No    Warm-up and Cool-down Performed on first and last piece of equipment    Resistance Training Performed Yes    VAD Patient? No    PAD/SET Patient? No      Pain Assessment   Currently in Pain? No/denies                Social History   Tobacco Use  Smoking Status Never  Smokeless Tobacco Never    Goals Met:  Independence with exercise equipment Exercise tolerated well No report of concerns or symptoms today Strength training completed today  Goals Unmet:  Not Applicable  Comments: Pt able to follow exercise prescription today without complaint.  Will continue to monitor for progression.    Dr. Emily Filbert is Medical Director for [place].  Dr. Ottie Glazier is Medical Director for [facility] Pulmonary Rehabilitation.

## 2021-02-18 ENCOUNTER — Encounter: Payer: BC Managed Care – PPO | Admitting: *Deleted

## 2021-02-18 ENCOUNTER — Other Ambulatory Visit: Payer: Self-pay

## 2021-02-18 DIAGNOSIS — Z955 Presence of coronary angioplasty implant and graft: Secondary | ICD-10-CM | POA: Diagnosis not present

## 2021-02-18 DIAGNOSIS — I213 ST elevation (STEMI) myocardial infarction of unspecified site: Secondary | ICD-10-CM | POA: Diagnosis not present

## 2021-02-18 NOTE — Progress Notes (Signed)
Daily Session Note  Patient Details  Name: Jose Edwards MRN: [id number] Date of Birth: 03/15/1956 Referring Provider:   Flowsheet Row Cardiac Rehab from 01/31/2021 in [facility] Cardiac and Pulmonary Rehab  Referring Provider Gwenlyn Found       Encounter Date: 02/18/2021  Check In:  Session Check In - 02/18/21 1713       Check-In   Supervising physician immediately available to respond to emergencies See telemetry face sheet for immediately available ER MD    Location [facility]-Cardiac & Pulmonary Rehab    Staff Present Renita Papa, RN BSN;Joseph Tessie Fass, RCP,RRT,BSRT;Kara [place], [place], ASCM CEP, Exercise Physiologist    Virtual Visit No    Medication changes reported     No    Fall or balance concerns reported    No    Warm-up and Cool-down Performed on first and last piece of equipment    Resistance Training Performed Yes    VAD Patient? No    PAD/SET Patient? No      Pain Assessment   Currently in Pain? No/denies                Social History   Tobacco Use  Smoking Status Never  Smokeless Tobacco Never    Goals Met:  Independence with exercise equipment Exercise tolerated well No report of concerns or symptoms today Strength training completed today  Goals Unmet:  Not Applicable  Comments: Pt able to follow exercise prescription today without complaint.  Will continue to monitor for progression.    Dr. Emily Filbert is Medical Director for [place].  Dr. Ottie Glazier is Medical Director for [facility] Pulmonary Rehabilitation.

## 2021-02-20 ENCOUNTER — Other Ambulatory Visit: Payer: Self-pay

## 2021-02-20 ENCOUNTER — Encounter: Payer: BC Managed Care – PPO | Admitting: *Deleted

## 2021-02-20 DIAGNOSIS — I213 ST elevation (STEMI) myocardial infarction of unspecified site: Secondary | ICD-10-CM | POA: Diagnosis not present

## 2021-02-20 DIAGNOSIS — Z955 Presence of coronary angioplasty implant and graft: Secondary | ICD-10-CM | POA: Diagnosis not present

## 2021-02-20 NOTE — Progress Notes (Signed)
Daily Session Note  Patient Details  Name: Jose Edwards MRN: [id number] Date of Birth: 09/19/1955 Referring Provider:   Flowsheet Row Cardiac Rehab from 01/31/2021 in [facility] Cardiac and Pulmonary Rehab  Referring Provider [place]       Encounter Date: 02/20/2021  Check In:  Session Check In - 02/20/21 1805       Check-In   Supervising physician immediately available to respond to emergencies See telemetry face sheet for immediately available ER MD    Location [facility]-Cardiac & Pulmonary Rehab    Staff Present Mary Jo Abernethy, RN, BSN, MA;Meredith Craven, RN BSN;Amanda Sommer, BA, ACSM CEP, Exercise Physiologist    Virtual Visit No    Medication changes reported     No    Fall or balance concerns reported    No    Tobacco Cessation No Change    Warm-up and Cool-down Performed on first and last piece of equipment    Resistance Training Performed Yes    VAD Patient? No    PAD/SET Patient? No      Pain Assessment   Currently in Pain? No/denies                Social History   Tobacco Use  Smoking Status Never  Smokeless Tobacco Never    Goals Met:  Independence with exercise equipment Exercise tolerated well No report of concerns or symptoms today  Goals Unmet:  Not Applicable  Comments: Pt able to follow exercise prescription today without complaint.  Will continue to monitor for progression.    Dr. Mark Miller is Medical Director for [facility] Cardiac Rehabilitation.  Dr. Fuad Aleskerov is Medical Director for [facility] Pulmonary Rehabilitation. 

## 2021-02-25 ENCOUNTER — Other Ambulatory Visit: Payer: Self-pay

## 2021-02-25 ENCOUNTER — Encounter: Payer: BC Managed Care – PPO | Admitting: *Deleted

## 2021-02-25 DIAGNOSIS — Z955 Presence of coronary angioplasty implant and graft: Secondary | ICD-10-CM

## 2021-02-25 DIAGNOSIS — I213 ST elevation (STEMI) myocardial infarction of unspecified site: Secondary | ICD-10-CM | POA: Diagnosis not present

## 2021-02-25 NOTE — Progress Notes (Signed)
Daily Session Note  Patient Details  Name: Jose Edwards MRN: [id number] Date of Birth: 05/07/1955 Referring Provider:   Flowsheet Row Cardiac Rehab from 01/31/2021 in [facility] Cardiac and Pulmonary Rehab  Referring Provider Gwenlyn Found       Encounter Date: 02/25/2021  Check In:  Session Check In - 02/25/21 1710       Check-In   Supervising physician immediately available to respond to emergencies See telemetry face sheet for immediately available ER MD    Location [facility]-Cardiac & Pulmonary Rehab    Staff Present Renita Papa, RN BSN;Joseph Tessie Fass, RCP,RRT,BSRT;Kara [place], [place], ASCM CEP, Exercise Physiologist    Virtual Visit No    Medication changes reported     No    Fall or balance concerns reported    No    Warm-up and Cool-down Performed on first and last piece of equipment    Resistance Training Performed Yes    VAD Patient? No    PAD/SET Patient? No      Pain Assessment   Currently in Pain? No/denies                Social History   Tobacco Use  Smoking Status Never  Smokeless Tobacco Never    Goals Met:  Independence with exercise equipment Exercise tolerated well No report of concerns or symptoms today Strength training completed today  Goals Unmet:  Not Applicable  Comments: Pt able to follow exercise prescription today without complaint.  Will continue to monitor for progression.    Dr. Emily Filbert is Medical Director for [place].  Dr. Ottie Glazier is Medical Director for [facility] Pulmonary Rehabilitation.

## 2021-02-27 ENCOUNTER — Encounter (INDEPENDENT_AMBULATORY_CARE_PROVIDER_SITE_OTHER): Payer: BC Managed Care – PPO

## 2021-02-27 ENCOUNTER — Other Ambulatory Visit: Payer: Self-pay

## 2021-02-27 ENCOUNTER — Encounter: Payer: Self-pay | Admitting: *Deleted

## 2021-02-27 DIAGNOSIS — Z955 Presence of coronary angioplasty implant and graft: Secondary | ICD-10-CM | POA: Diagnosis not present

## 2021-02-27 DIAGNOSIS — I213 ST elevation (STEMI) myocardial infarction of unspecified site: Secondary | ICD-10-CM | POA: Diagnosis not present

## 2021-02-27 NOTE — Progress Notes (Signed)
Daily Session Note  Patient Details  Name: Jose Edwards MRN: [id number] Date of Birth: January 13, 1956 Referring Provider:   Flowsheet Row Cardiac Rehab from 01/31/2021 in [facility] Cardiac and Pulmonary Rehab  Referring Provider Gwenlyn Found       Encounter Date: 02/27/2021  Check In:  Session Check In - 02/27/21 1628       Check-In   Supervising physician immediately available to respond to emergencies See telemetry face sheet for immediately available ER MD    Location [facility]-Cardiac & Pulmonary Rehab    Staff Present Birdie Sons, MPA, Nino Glow, MS, ASCM CEP, Exercise Physiologist;Joseph Tessie Fass, [place]    Virtual Visit No    Medication changes reported     No    Fall or balance concerns reported    No    Tobacco Cessation No Change    Warm-up and Cool-down Performed on first and last piece of equipment    Resistance Training Performed Yes    VAD Patient? No    PAD/SET Patient? No      Pain Assessment   Currently in Pain? No/denies                Social History   Tobacco Use  Smoking Status Never  Smokeless Tobacco Never    Goals Met:  Independence with exercise equipment Exercise tolerated well No report of concerns or symptoms today Strength training completed today  Goals Unmet:  Not Applicable  Comments: Pt able to follow exercise prescription today without complaint.  Will continue to monitor for progression.    Dr. Emily Filbert is Medical Director for [place].  Dr. Ottie Glazier is Medical Director for [facility] Pulmonary Rehabilitation.

## 2021-02-27 NOTE — Progress Notes (Signed)
Cardiac Individual Treatment Plan  Patient Details  Name: Jose Edwards MRN: [id number] Date of Birth: 1955-09-30 Referring Provider:   Flowsheet Row Cardiac Rehab from 01/31/2021 in [facility] Cardiac and Pulmonary Rehab  Referring Provider Gwenlyn Found       Initial Encounter Date:  Flowsheet Row Cardiac Rehab from 01/31/2021 in [facility] Cardiac and Pulmonary Rehab  Date 01/31/21       Visit Diagnosis: ST elevation myocardial infarction (STEMI), unspecified artery ([place])  Status post coronary artery stent placement  Patient's Home Medications on Admission:  Current Outpatient Medications:    aspirin 81 MG EC tablet, Take 1 tablet (81 mg total) by mouth daily. Swallow whole., Disp: 90 tablet, Rfl: 3   atorvastatin (LIPITOR) 80 MG tablet, Take 1 tablet (80 mg total) by mouth daily., Disp: 90 tablet, Rfl: 3   benzonatate (TESSALON PERLES) 100 MG capsule, 1-2 capsules every  8 hours as needed for cough (Patient taking differently: 1-2 capsules every  8 hours as needed for cough), Disp: 30 capsule, Rfl: 0   Cholecalciferol (VITAMIN D3) 50 MCG (2000 UT) TABS, Take 2,000 Units by mouth 2 (two) times a week., Disp: , Rfl:    clopidogrel (PLAVIX) 75 MG tablet, Take 8 tablets (600 mg total) by mouth daily for 1 day, THEN 1 tablet (75 mg total) daily., Disp: 98 tablet, Rfl: 3   dapagliflozin propanediol (FARXIGA) 10 MG TABS tablet, Take 1 tablet (10 mg total) by mouth daily before breakfast., Disp: 30 tablet, Rfl: 11   fluticasone (FLONASE) 50 MCG/ACT nasal spray, Place 2 sprays into both nostrils daily., Disp: 16 g, Rfl: 2   losartan (COZAAR) 25 MG tablet, Take 1 tablet (25 mg total) by mouth daily., Disp: 90 tablet, Rfl: 3   metoprolol succinate (TOPROL-XL) 50 MG 24 hr tablet, Take 1 tablet (50 mg total) by mouth daily. Take with or immediately following a meal., Disp: 30 tablet, Rfl: 11   nitroGLYCERIN (NITROSTAT) 0.4 MG SL tablet, Place 1 tablet (0.4 mg total) under the tongue every 5 (five) minutes  as needed for chest pain., Disp: 25 tablet, Rfl: 1   omeprazole (PRILOSEC OTC) 20 MG tablet, Take 20 mg by mouth daily as needed (for heartburn)., Disp: , Rfl:    vitamin B-12 (CYANOCOBALAMIN) 100 MCG tablet, Take 100 mcg by mouth every other day. , Disp: , Rfl:    vitamin C (ASCORBIC ACID) 500 MG tablet, Take 500-1,000 mg by mouth daily., Disp: , Rfl:   Past Medical History: Past Medical History:  Diagnosis Date   Acute ST elevation myocardial infarction (STEMI) of anterolateral wall ([place]) 11/26/2020   100% D1 - DES PCI 2.25 x 16 (2.4 mm)   Benign positional vertigo    CAD S/P DES PCI D1  11/27/2020   100% D1 (DES PCI with Onyx Frontier DES 2.25 x 18 - deployed to 2.4 mm)   Coronary artery disease involving native coronary artery of native heart with angina pectoris ([place]) 11/26/2020   8/29 ([place] STEMI): Cath => Culprit lesion-100% D1 (DES PCI with Onyx Frontier DES 2.25 x 18 - deployed to 2.4 mm). Mid LM 30%, Ost-prox LAD 30% & RI 30%.     ED (erectile dysfunction)    GERD (gastroesophageal reflux disease)    Osteoarthrosis, unspecified whether generalized or localized, unspecified site     Tobacco Use: Social History   Tobacco Use  Smoking Status Never  Smokeless Tobacco Never    Labs: Recent Review Flowsheet Data     Labs for  ITP Cardiac and Pulmonary Rehab Latest Ref Rng & Units 02/27/2010 06/21/2013 05/21/2016 11/26/2020 01/09/2021   Cholestrol 100 - 199 mg/dL 343(H) 686(H) 683 729 021   LDLCALC 0 - 99 mg/dL - 115 520(E) 022(V) 70   LDLDIRECT mg/dL 361.2 - - - -   HDL >24 mg/dL 49.75 - 30.05 11(M) 41   Trlycerides 0 - 149 mg/dL 211.0(H) - 88.0 132 59   Hemoglobin A1c 4.8 - 5.6 % - - - 5.6 -        Exercise Target Goals: Exercise Program Goal: Individual exercise prescription set using results from initial 6 min walk test and THRR while considering  patient's activity barriers and safety.   Exercise Prescription Goal: Initial exercise prescription builds to 30-45  minutes a day of aerobic activity, 2-3 days per week.  Home exercise guidelines will be given to patient during program as part of exercise prescription that the participant will acknowledge.   Education: Aerobic Exercise: - Group verbal and visual presentation on the components of exercise prescription. Introduces F.I.T.T principle from ACSM for exercise prescriptions.  Reviews F.I.T.T. principles of aerobic exercise including progression. Written material given at graduation.   Education: Resistance Exercise: - Group verbal and visual presentation on the components of exercise prescription. Introduces F.I.T.T principle from ACSM for exercise prescriptions  Reviews F.I.T.T. principles of resistance exercise including progression. Written material given at graduation.    Education: Exercise & Equipment Safety: - Individual verbal instruction and demonstration of equipment use and safety with use of the equipment. Flowsheet Row Cardiac Rehab from 02/20/2021 in [facility] Cardiac and Pulmonary Rehab  Date 01/31/21  Educator AS  Instruction Review Code 1- Verbalizes Understanding       Education: Exercise Physiology & General Exercise Guidelines: - Group verbal and written instruction with models to review the exercise physiology of the cardiovascular system and associated critical values. Provides general exercise guidelines with specific guidelines to those with heart or lung disease.    Education: Flexibility, Balance, Mind/Body Relaxation: - Group verbal and visual presentation with interactive activity on the components of exercise prescription. Introduces F.I.T.T principle from ACSM for exercise prescriptions. Reviews F.I.T.T. principles of flexibility and balance exercise training including progression. Also discusses the mind body connection.  Reviews various relaxation techniques to help reduce and manage stress (i.e. Deep breathing, progressive muscle relaxation, and visualization).  Balance handout provided to take home. Written material given at graduation. Flowsheet Row Cardiac Rehab from 02/20/2021 in [facility] Cardiac and Pulmonary Rehab  Date 02/06/21  Educator AS  Instruction Review Code 1- Verbalizes Understanding       Activity Barriers & Risk Stratification:  Activity Barriers & Cardiac Risk Stratification - 01/16/21 1416       Activity Barriers & Cardiac Risk Stratification   Activity Barriers Other (comment)    Comments left ankle has screws    Cardiac Risk Stratification Moderate             6 Minute Walk:  6 Minute Walk     Row Name 01/31/21 1627         6 Minute Walk   Phase Initial     Distance 1485 feet     Walk Time 6 minutes     # of Rest Breaks 0     MPH 2.8     METS 3.6     RPE 11     Perceived Dyspnea  1     VO2 Peak 12.47     Symptoms No  Resting HR 80 bpm     Resting BP 118/60     Resting Oxygen Saturation  97 %     Exercise Oxygen Saturation  during 6 min walk 97 %     Max Ex. HR 110 bpm     Max Ex. BP 190/76     2 Minute Post BP 108/54              Oxygen Initial Assessment:   Oxygen Re-Evaluation:   Oxygen Discharge (Final Oxygen Re-Evaluation):   Initial Exercise Prescription:  Initial Exercise Prescription - 01/31/21 1600       Date of Initial Exercise RX and Referring Provider   Date 01/31/21    Referring Provider Allyson Sabal      Oxygen   Maintain Oxygen Saturation 88% or higher      Treadmill   MPH 2.8    Grade 1    Minutes 15    METs 3.6      Recumbant Bike   Level 3    RPM 60    Watts 51    Minutes 15    METs 3.6      NuStep   Level 3    SPM 80    Minutes 15    METs 3.6      REL-XR   Level 3    Speed 50    Minutes 15    METs 3.6      Prescription Details   Frequency (times per week) 3    Duration Progress to 30 minutes of continuous aerobic without signs/symptoms of physical distress      Intensity   THRR 40-80% of Max Heartrate 110-139    Ratings of Perceived  Exertion 11-15    Perceived Dyspnea 0-4      Resistance Training   Training Prescription Yes    Weight 4 lb    Reps 10-15             Perform Capillary Blood Glucose checks as needed.  Exercise Prescription Changes:   Exercise Prescription Changes     Row Name 01/31/21 1600 02/12/21 1400 02/25/21 1200 02/25/21 1700       Response to Exercise   Blood Pressure (Admit) 118/60 124/68 128/62 --    Blood Pressure (Exercise) 190/76 146/68 -- --    Blood Pressure (Exit) 108/54 122/64 100/60 --    Heart Rate (Admit) 80 bpm 70 bpm 78 bpm --    Heart Rate (Exercise) 110 bpm 11 bpm 115 bpm --    Heart Rate (Exit) 80 bpm 54 bpm 87 bpm --    Oxygen Saturation (Admit) 97 % -- -- --    Oxygen Saturation (Exercise) 97 % -- -- --    Rating of Perceived Exertion (Exercise) 11 13 13  --    Perceived Dyspnea (Exercise) 1 -- -- --    Symptoms none none none --    Duration -- Continue with 30 min of aerobic exercise without signs/symptoms of physical distress. Continue with 30 min of aerobic exercise without signs/symptoms of physical distress. --    Intensity -- THRR unchanged THRR unchanged --      Progression   Progression -- Continue to progress workloads to maintain intensity without signs/symptoms of physical distress. Continue to progress workloads to maintain intensity without signs/symptoms of physical distress. --    Average METs -- 3.3 3.73 --      Resistance Training   Training Prescription -- Yes Yes --    Weight --  4 lb 4 lb --    Reps -- 10-15 10-15 --      Interval Training   Interval Training -- -- No --      Treadmill   MPH -- -- 3.2 --    Grade -- -- 1 --    Minutes -- -- 15 --    METs -- -- 3.89 --      Recumbant Bike   Level -- -- 3 --    Watts -- -- 32 --    Minutes -- -- 15 --      NuStep   Level -- -- 3 --    Minutes -- -- 15 --    METs -- -- 3.6 --      REL-XR   Level -- -- 4 --    Minutes -- -- 15 --    METs -- -- 4.2 --      Home Exercise Plan    Plans to continue exercise at -- -- -- [employer] (comment)  gym at work    Frequency -- -- -- Add 2 additional days to program exercise sessions.  start with 1    Initial Home Exercises Provided -- -- -- 02/25/21             Exercise Comments:   Exercise Goals and Review:   Exercise Goals     Row Name 01/31/21 1634             Exercise Goals   Increase Physical Activity Yes       Intervention Provide advice, education, support and counseling about physical activity/exercise needs.;Develop an individualized exercise prescription for aerobic and resistive training based on initial evaluation findings, risk stratification, comorbidities and participant's personal goals.       Expected Outcomes Short Term: Attend rehab on a regular basis to increase amount of physical activity.;Long Term: Add in home exercise to make exercise part of routine and to increase amount of physical activity.;Long Term: Exercising regularly at least 3-5 days a week.       Increase Strength and Stamina Yes       Intervention Provide advice, education, support and counseling about physical activity/exercise needs.;Develop an individualized exercise prescription for aerobic and resistive training based on initial evaluation findings, risk stratification, comorbidities and participant's personal goals.       Expected Outcomes Short Term: Increase workloads from initial exercise prescription for resistance, speed, and METs.;Short Term: Perform resistance training exercises routinely during rehab and add in resistance training at home;Long Term: Improve cardiorespiratory fitness, muscular endurance and strength as measured by increased METs and functional capacity (6MWT)       Able to understand and use rate of perceived exertion (RPE) scale Yes       Intervention Provide education and explanation on how to use RPE scale       Expected Outcomes Short Term: Able to use RPE daily in rehab to express  subjective intensity level;Long Term:  Able to use RPE to guide intensity level when exercising independently       Able to understand and use Dyspnea scale Yes       Intervention Provide education and explanation on how to use Dyspnea scale       Expected Outcomes Short Term: Able to use Dyspnea scale daily in rehab to express subjective sense of shortness of breath during exertion;Long Term: Able to use Dyspnea scale to guide intensity level when exercising independently       Knowledge  and understanding of Target Heart Rate Range (THRR) Yes       Intervention Provide education and explanation of THRR including how the numbers were predicted and where they are located for reference       Expected Outcomes Short Term: Able to state/look up THRR;Short Term: Able to use daily as guideline for intensity in rehab;Long Term: Able to use THRR to govern intensity when exercising independently       Able to check pulse independently Yes       Intervention Provide education and demonstration on how to check pulse in carotid and radial arteries.;Review the importance of being able to check your own pulse for safety during independent exercise       Expected Outcomes Short Term: Able to explain why pulse checking is important during independent exercise;Long Term: Able to check pulse independently and accurately       Understanding of Exercise Prescription Yes       Intervention Provide education, explanation, and written materials on patient's individual exercise prescription       Expected Outcomes Short Term: Able to explain program exercise prescription;Long Term: Able to explain home exercise prescription to exercise independently                Exercise Goals Re-Evaluation :  Exercise Goals Re-Evaluation     Row Name 02/04/21 1700 02/12/21 1420 02/12/21 1424 02/25/21 1203 02/25/21 1737     Exercise Goal Re-Evaluation   Exercise Goals Review Increase Physical Activity;Able to understand and use  rate of perceived exertion (RPE) scale;Knowledge and understanding of Target Heart Rate Range (THRR);Understanding of Exercise Prescription;Increase Strength and Stamina;Able to check pulse independently Increase Physical Activity;Increase Strength and Stamina -- Increase Physical Activity;Increase Strength and Stamina Increase Physical Activity;Increase Strength and Stamina;Understanding of Exercise Prescription   Comments Reviewed RPE and dyspnea scales, THR and program prescription with pt today.  Pt voiced understanding and was given a copy of goals to take home. Kabe has done well in his first sessions.  He reaches lower end of THR range.  Staff will monitor progress. -- Russell is doing well in rehab. He has increased to speed 3.2 and 1% incline on the treadmill. He also went up to level 4 on the XR. Staff should encourage patient to increase his handweights. Will continue to monitor Reviewed home exercise with pt today.  Pt plans to go to his gym at work for exercise. Patient states he has multiple aerobic machines to pick from and is not sure which ones he will use yet. I also encouraged him to keep up with resistance training. Reviewed THR, pulse, RPE, sign and symptoms, pulse oximetery and when to call 911 or MD.  Also discussed weather considerations and indoor options.  Pt voiced understanding.   Expected Outcomes Short: Use RPE daily to regulate intensity. Long: Follow program prescription in THR. -- Short : attend consistently Long:  improve overall stamina Short: Increase handweights Long: Continue to increase overall MET level Short: Start with 1 day of exercise at home and check HR Long: Exercise independently at homr at appropriate prescription            Discharge Exercise Prescription (Final Exercise Prescription Changes):  Exercise Prescription Changes - 02/25/21 1700       Home Exercise Plan   Plans to continue exercise at [employer] (comment)   gym at work   Frequency  Add 2 additional days to program exercise sessions.   start with 1  Initial Home Exercises Provided 02/25/21             Nutrition:  Target Goals: Understanding of nutrition guidelines, daily intake of sodium '1500mg'$ , cholesterol '200mg'$ , calories 30% from fat and 7% or less from saturated fats, daily to have 5 or more servings of fruits and vegetables.  Education: All About Nutrition: -Group instruction provided by verbal, written material, interactive activities, discussions, models, and posters to present general guidelines for heart healthy nutrition including fat, fiber, MyPlate, the role of sodium in heart healthy nutrition, utilization of the nutrition label, and utilization of this knowledge for meal planning. Follow up email sent as well. Written material given at graduation. Flowsheet Row Cardiac Rehab from 02/20/2021 in [facility] Cardiac and Pulmonary Rehab  Education need identified 01/31/21  Date 02/13/21  Educator [place]  Instruction Review Code 1- Verbalizes Understanding       Biometrics:  Pre Biometrics - 01/31/21 1635       Pre Biometrics   Height $Remov'5\' 7"'tJawEc$  (1.702 m)    Weight 225 lb 6.4 oz (102.2 kg)    BMI (Calculated) 35.29    Single Leg Stand 20 seconds              Nutrition Therapy Plan and Nutrition Goals:   Nutrition Assessments:  MEDIFICTS Score Key: ?70 Need to make dietary changes  40-70 Heart Healthy Diet ? 40 Therapeutic Level Cholesterol Diet  Flowsheet Row Cardiac Rehab from 01/31/2021 in [facility] Cardiac and Pulmonary Rehab  Picture Your Plate Total Score on Admission 63      Picture Your Plate Scores: <42 Unhealthy dietary pattern with much room for improvement. 41-50 Dietary pattern unlikely to meet recommendations for good health and room for improvement. 51-60 More healthful dietary pattern, with some room for improvement.  >60 Healthy dietary pattern, although there may be some specific behaviors that could be improved.    Nutrition  Goals Re-Evaluation:   Nutrition Goals Discharge (Final Nutrition Goals Re-Evaluation):   Psychosocial: Target Goals: Acknowledge presence or absence of significant depression and/or stress, maximize coping skills, provide positive support system. Participant is able to verbalize types and ability to use techniques and skills needed for reducing stress and depression.   Education: Stress, Anxiety, and Depression - Group verbal and visual presentation to define topics covered.  Reviews how body is impacted by stress, anxiety, and depression.  Also discusses healthy ways to reduce stress and to treat/manage anxiety and depression.  Written material given at graduation.   Education: Sleep Hygiene -Provides group verbal and written instruction about how sleep can affect your health.  Define sleep hygiene, discuss sleep cycles and impact of sleep habits. Review good sleep hygiene tips.    Initial Review & Psychosocial Screening:  Initial Psych Review & Screening - 01/16/21 1419       Initial Review   Current issues with None Identified      Family Dynamics   Good Support System? Yes   wife, family, coworkers     Barriers   Psychosocial barriers to participate in program There are no identifiable barriers or psychosocial needs.;The patient should benefit from training in stress management and relaxation.      Screening Interventions   Interventions Encouraged to exercise;Provide feedback about the scores to participant;To provide support and resources with identified psychosocial needs    Expected Outcomes Short Term goal: Utilizing psychosocial counselor, staff and physician to assist with identification of specific Stressors or current issues interfering with healing process. Setting desired goal  for each stressor or current issue identified.;Long Term Goal: Stressors or current issues are controlled or eliminated.;Short Term goal: Identification and review with participant of any Quality  of Life or Depression concerns found by scoring the questionnaire.;Long Term goal: The participant improves quality of Life and PHQ9 Scores as seen by post scores and/or verbalization of changes             Quality of Life Scores:   Quality of Life - 01/31/21 1640       Quality of Life   Select Quality of Life      Quality of Life Scores   Health/Function Pre 21.2 %    Socioeconomic Pre 24.43 %    Psych/Spiritual Pre 22.86 %    Family Pre 25.2 %    GLOBAL Pre 22.79 %            Scores of 19 and below usually indicate a poorer quality of life in these areas.  A difference of  2-3 points is a clinically meaningful difference.  A difference of 2-3 points in the total score of the Quality of Life Index has been associated with significant improvement in overall quality of life, self-image, physical symptoms, and general health in studies assessing change in quality of life.  PHQ-9: Recent Review Flowsheet Data     Depression screen [facility] 2/9 01/31/2021   Decreased Interest 0   Down, Depressed, Hopeless 0   PHQ - 2 Score 0   Altered sleeping 1   Tired, decreased energy 0   Change in appetite 0   Feeling bad or failure about yourself  0   Trouble concentrating 0   Moving slowly or fidgety/restless 0   Suicidal thoughts 0   PHQ-9 Score 1   Difficult doing work/chores Not difficult at all      Interpretation of Total Score  Total Score Depression Severity:  1-4 = Minimal depression, 5-9 = Mild depression, 10-14 = Moderate depression, 15-19 = Moderately severe depression, 20-27 = Severe depression   Psychosocial Evaluation and Intervention:  Psychosocial Evaluation - 01/16/21 1427       Psychosocial Evaluation & Interventions   Interventions Encouraged to exercise with the program and follow exercise prescription    Comments Ewart reports 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 He does struggle with some fatigue but thinks its coming from his medications. Prior to his MI, he was  on vitamins and GERD medicine, so a lot of these are new to him. He has been back to work full time at [employer] for 2 weeks now, still on light duty in the shop, and reports he is feeling well. He does not report any stress concerns. His sleep issues are not new, but some nights it is difficult to go to sleep. He has a great support system at home and at work. He is very motivated to make the changes needed to have a heart healthy lifestyle.    Expected Outcomes Short: attend cardiac rehab for education and exercise. Long: develop and maintain positive self care habits.    Continue Psychosocial Services  Follow up required by staff             Psychosocial Re-Evaluation:   Psychosocial Discharge (Final Psychosocial Re-Evaluation):   Vocational Rehabilitation: Provide vocational rehab assistance to qualifying candidates.   Vocational Rehab Evaluation & Intervention:  Vocational Rehab - 01/16/21 1419       Initial Vocational Rehab Evaluation & Intervention   Assessment shows need for Vocational Rehabilitation  No             Education: Education Goals: Education classes will be provided on a variety of topics geared toward better understanding of heart health and risk factor modification. Participant will state understanding/return demonstration of topics presented as noted by education test scores.  Learning Barriers/Preferences:  Learning Barriers/Preferences - 01/16/21 1419       Learning Barriers/Preferences   Learning Barriers None    Learning Preferences None             General Cardiac Education Topics:  AED/CPR: - Group verbal and written instruction with the use of models to demonstrate the basic use of the AED with the basic ABC's of resuscitation.   Anatomy and Cardiac Procedures: - Group verbal and visual presentation and models provide information about basic cardiac anatomy and function. Reviews the testing methods done to diagnose heart disease and the  outcomes of the test results. Describes the treatment choices: Medical Management, Angioplasty, or Coronary Bypass Surgery for treating various heart conditions including Myocardial Infarction, Angina, Valve Disease, and Cardiac Arrhythmias.  Written material given at graduation.   Medication Safety: - Group verbal and visual instruction to review commonly prescribed medications for heart and lung disease. Reviews the medication, class of the drug, and side effects. Includes the steps to properly store meds and maintain the prescription regimen.  Written material given at graduation.   Intimacy: - Group verbal instruction through game format to discuss how heart and lung disease can affect sexual intimacy. Written material given at graduation..   Know Your Numbers and Heart Failure: - Group verbal and visual instruction to discuss disease risk factors for cardiac and pulmonary disease and treatment options.  Reviews associated critical values for Overweight/Obesity, Hypertension, Cholesterol, and Diabetes.  Discusses basics of heart failure: signs/symptoms and treatments.  Introduces Heart Failure Zone chart for action plan for heart failure.  Written material given at graduation. Flowsheet Row Cardiac Rehab from 02/20/2021 in [facility] Cardiac and Pulmonary Rehab  Education need identified 01/31/21       Infection Prevention: - Provides verbal and written material to individual with discussion of infection control including proper hand washing and proper equipment cleaning during exercise session. Flowsheet Row Cardiac Rehab from 02/20/2021 in [facility] Cardiac and Pulmonary Rehab  Date 01/31/21  Educator AS  Instruction Review Code 1- Verbalizes Understanding       Falls Prevention: - Provides verbal and written material to individual with discussion of falls prevention and safety. Flowsheet Row Cardiac Rehab from 02/20/2021 in [facility] Cardiac and Pulmonary Rehab  Date 01/31/21  Educator AS   Instruction Review Code 1- Verbalizes Understanding       Other: -Provides group and verbal instruction on various topics (see comments)   Knowledge Questionnaire Score:  Knowledge Questionnaire Score - 01/31/21 1636       Knowledge Questionnaire Score   Pre Score 23/26             Core Components/Risk Factors/Patient Goals at Admission:  Personal Goals and Risk Factors at Admission - 01/31/21 1639       Core Components/Risk Factors/Patient Goals on Admission    Weight Management Yes    Intervention Weight Management/Obesity: Establish reasonable short term and long term weight goals.    Admit Weight 225 lb 6.4 oz (102.2 kg)    Goal Weight: Short Term 220 lb (99.8 kg)    Goal Weight: Long Term 215 lb (97.5 kg)    Expected Outcomes Short Term: Continue  to assess and modify interventions until short term weight is achieved;Long Term: Adherence to nutrition and physical activity/exercise program aimed toward attainment of established weight goal;Weight Loss: Understanding of general recommendations for a balanced deficit meal plan, which promotes 1-2 lb weight loss per week and includes a negative energy balance of [phone] kcal/d    Lipids Yes    Intervention Provide education and support for participant on nutrition & aerobic/resistive exercise along with prescribed medications to achieve LDL '70mg'$ , HDL >$Remo'40mg'NIEGA$ .    Expected Outcomes Short Term: Participant states understanding of desired cholesterol values and is compliant with medications prescribed. Participant is following exercise prescription and nutrition guidelines.;Long Term: Cholesterol controlled with medications as prescribed, with individualized exercise RX and with personalized nutrition plan. Value goals: LDL < $Rem'70mg'BYFy$ , HDL > 40 mg.             Education:Diabetes - Individual verbal and written instruction to review signs/symptoms of diabetes, desired ranges of glucose level fasting, after meals and with  exercise. Acknowledge that pre and post exercise glucose checks will be done for 3 sessions at entry of program.   Core Components/Risk Factors/Patient Goals Review:    Core Components/Risk Factors/Patient Goals at Discharge (Final Review):    ITP Comments:  ITP Comments     Row Name 01/16/21 1403 01/31/21 1642 02/04/21 1659 02/27/21 0635     ITP Comments Initial telephone orientation completed. Diagnosis can be found in [facility] 8/29. EP orientation scheduled for Thursday 11/3 at 3pm. Completed 6MWT and gym orientation. Initial ITP created and sent for review to Dr. Emily Filbert, Medical Director. First full day of exercise!  Patient was oriented to gym and equipment including functions, settings, policies, and procedures.  Patient's individual exercise prescription and treatment plan were reviewed.  All starting workloads were established based on the results of the 6 minute walk test done at initial orientation visit.  The plan for exercise progression was also introduced and progression will be customized based on patient's performance and goals. 30 Day review completed. Medical Director ITP review done, changes made as directed, and signed approval by Medical Director.             Comments:

## 2021-02-28 ENCOUNTER — Encounter: Payer: BC Managed Care – PPO | Attending: Cardiovascular Disease | Admitting: *Deleted

## 2021-02-28 DIAGNOSIS — Z955 Presence of coronary angioplasty implant and graft: Secondary | ICD-10-CM | POA: Insufficient documentation

## 2021-02-28 DIAGNOSIS — I213 ST elevation (STEMI) myocardial infarction of unspecified site: Secondary | ICD-10-CM | POA: Insufficient documentation

## 2021-02-28 NOTE — Progress Notes (Signed)
Daily Session Note  Patient Details  Name: Jose Edwards MRN: [id number] Date of Birth: 10-Jun-1955 Referring Provider:   Flowsheet Row Cardiac Rehab from 01/31/2021 in [facility] Cardiac and Pulmonary Rehab  Referring Provider Gwenlyn Found       Encounter Date: 02/28/2021  Check In:  Session Check In - 02/28/21 1704       Check-In   Supervising physician immediately available to respond to emergencies See telemetry face sheet for immediately available ER MD    Location [facility]-Cardiac & Pulmonary Rehab    Staff Present Renita Papa, RN BSN;Joseph Tessie Fass, RCP,RRT,BSRT;Amanda [place], [place], ACSM CEP, Exercise Physiologist    Virtual Visit No    Medication changes reported     No    Fall or balance concerns reported    No    Warm-up and Cool-down Performed on first and last piece of equipment    Resistance Training Performed Yes    VAD Patient? No    PAD/SET Patient? No      Pain Assessment   Currently in Pain? No/denies                Social History   Tobacco Use  Smoking Status Never  Smokeless Tobacco Never    Goals Met:  Independence with exercise equipment Exercise tolerated well No report of concerns or symptoms today Strength training completed today  Goals Unmet:  Not Applicable  Comments: Pt able to follow exercise prescription today without complaint.  Will continue to monitor for progression.    Dr. Emily Filbert is Medical Director for [place].  Dr. Ottie Glazier is Medical Director for [facility] Pulmonary Rehabilitation.

## 2021-03-01 ENCOUNTER — Ambulatory Visit: Payer: Self-pay

## 2021-03-01 ENCOUNTER — Other Ambulatory Visit: Payer: Self-pay

## 2021-03-01 DIAGNOSIS — Z23 Encounter for immunization: Secondary | ICD-10-CM

## 2021-03-02 ENCOUNTER — Telehealth: Payer: Self-pay | Admitting: Cardiology

## 2021-03-02 NOTE — Telephone Encounter (Signed)
Patient called in reporting cough, congestion and generalized body aches that started last night into this morning.  States he received his flu shot yesterday afternoon and began feeling poorly last night into this morning.  Denies any fever.  Reviewed appropriate medications for patient to take with his cardiac history.  Advised supportive therapy, hydration.  If continues to be symptomatic within 2 to 3 days advised that he follow-up with his PCP.  He voiced understanding and thanked me for follow-up call

## 2021-03-06 ENCOUNTER — Other Ambulatory Visit: Payer: Self-pay

## 2021-03-06 DIAGNOSIS — I213 ST elevation (STEMI) myocardial infarction of unspecified site: Secondary | ICD-10-CM | POA: Diagnosis not present

## 2021-03-06 DIAGNOSIS — Z955 Presence of coronary angioplasty implant and graft: Secondary | ICD-10-CM

## 2021-03-06 NOTE — Progress Notes (Signed)
Daily Session Note  Patient Details  Name: Jose Edwards MRN: [id number] Date of Birth: 10-12-1955 Referring Provider:   Flowsheet Row Cardiac Rehab from 01/31/2021 in [facility] Cardiac and Pulmonary Rehab  Referring Provider Gwenlyn Found       Encounter Date: 03/06/2021  Check In:  Session Check In - 03/06/21 1646       Check-In   Supervising physician immediately available to respond to emergencies See telemetry face sheet for immediately available ER MD    Location [facility]-Cardiac & Pulmonary Rehab    Staff Present Birdie Sons, MPA, Nino Glow, MS, ASCM CEP, Exercise Physiologist;Melissa Caiola, RDN, LDN    Virtual Visit No    Medication changes reported     No    Fall or balance concerns reported    No    Tobacco Cessation No Change    Warm-up and Cool-down Performed on first and last piece of equipment    Resistance Training Performed Yes    VAD Patient? No    PAD/SET Patient? No      Pain Assessment   Currently in Pain? No/denies                Social History   Tobacco Use  Smoking Status Never  Smokeless Tobacco Never    Goals Met:  Independence with exercise equipment Exercise tolerated well No report of concerns or symptoms today Strength training completed today  Goals Unmet:  Not Applicable  Comments: Pt able to follow exercise prescription today without complaint.  Will continue to monitor for progression.    Dr. Emily Filbert is Medical Director for [place].  Dr. Ottie Glazier is Medical Director for [facility] Pulmonary Rehabilitation.

## 2021-03-10 ENCOUNTER — Encounter: Payer: Self-pay | Admitting: Cardiology

## 2021-03-10 NOTE — Progress Notes (Signed)
Primary Care Provider: Karie SchwalbeLetvak, Richard I, MD Cardiologist: Nanetta BattyJonathan Berry, MD Electrophysiologist: None  Clinic Note: Chief Complaint  Patient presents with   Follow-up    No major complaints   Coronary Artery Disease    No recurrent angina   Cardiomyopathy    No active CHF symptoms.  Did not tolerate Entresto.  Went back to losartan.   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  Plan:  Continue beta-blocker at current dose and increase ARB dose to 50 mg daily. Continue aspirin and Plavix-DAPT x1 year minimum. Continue high-dose statin. Continue Farxiga.      Relevant Medications   losartan (COZAAR) 50 MG tablet   Other Relevant Orders   ECHOCARDIOGRAM COMPLETE   Lipid panel   Comprehensive metabolic panel   CAD S/P DES PCI D1  (Chronic)    Is on DAPT with aspirin and Plavix.  No bleeding issues.  Plan will be to continue DAPT through August 2023.  At that point would stop aspirin and continue Plavix another year.       Relevant Medications   losartan (COZAAR) 50 MG tablet   Other Relevant Orders   ECHOCARDIOGRAM COMPLETE   Ischemic cardiomyopathy - EF 40-45% post Ant-Lat STEMI - Primary (Chronic)    Would hope that EF will improve on optimize medical management.  Plan will be to recheck echocardiogram later this month.  Did not tolerate Entresto-was able to go back to losartan, but would like to titrate further.  Plan:  Increase losartan to 50 mg and continue Toprol 50  mg. Euvolemic on exam ->   No diuretic required beyond procedure. Echocardiogram ordered for end of the month.      Relevant Medications   losartan (COZAAR) 50 MG tablet   Other Relevant Orders   ECHOCARDIOGRAM COMPLETE   Comprehensive metabolic panel   Hyperlipidemia with target LDL less than 70 (Chronic)    Tolerating high-dose atorvastatin.  Labs as of October showed LDL down to 70.  Will be due for labs to be rechecked in April 2023.   Plan: Continue current dose of atorvastatin 80 mg daily.  No myalgias or arthralgias.      Relevant Medications   losartan (COZAAR) 50 MG tablet   Other Relevant Orders   Lipid panel   Comprehensive metabolic panel   Essential hypertension (Chronic)    Blood pressure pretty well controlled, but still has room to titrate losartan.   Plan: Increase losartan to 50 mg daily continue current dose of Toprol.      Relevant Medications   losartan (COZAAR) 50 MG tablet     Other   Obesity (BMI 30-39.9) (Chronic)    The patient understands the need to lose weight with diet and exercise. We have discussed specific strategies for this.      Relevant Orders   Lipid panel   Comprehensive metabolic panel   ===================================  HPI:    Jose Edwards is a 65 y.o. 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  Recent Hospitalizations:  11/26/2020 -Anterolateral STEMI. Chest & Back pain off and on => Anterolateral STEMI ->  CTA chest excluded thoracic aortic dissection (mild dilation) -> taken to Cath Lab with diagonal PCI. => EF 40-45%.  Mod HK of Apical Anterior wall. Discharged on Toprol, Farxiga and losartan along with aspirin and Plavix (had dyspnea with Brilinta.)  Jose Edwards was last seen on December 11, 2020 by Azalee Course, PA-C as a post hospital follow-up.  Still noted occasional back pain  seem to be similar to his presenting symptoms but seem more musculoskeletal.  No chest discomfort.  EKG with some evolutionary changes-T wave version.. Converted from losartan to Entresto 49 to 51 mg twice daily. -- unable to take Entersto - HA, put back on Losartan.   Lipid panel and LFTs ordered Told to stay out of work until Monday, September 19.  2 weeks of limited restriction followed by full clearance.   Reviewed  CV studies:    The following studies were reviewed today: (if available, images/films reviewed: From Epic Chart or Care Everywhere) Cardiac Cath-PCI 11/26/2020: (Culprit) D1 100% thrombotic. ->  DES PCI (Onyx Frontier DES 2.25 mm x 18 mm); Ostial-mid LM 30%.  RI 30%. ostial-proximal LAD 30%.  (cath films personally reviewed)    TTE 11/27/2020: (In setting of Anterolateral STEMI).  EF 40 to 45%.  Mildly decreased function.  Mild concentric LVH.  GRII DD.  Elevated LAP with moderate HK of apical anterior wall.  Mild LA dilation.  Mild AOV sclerosis-no stenosis.  Elevated RAP estimated 15 mmHg.  Normal RV size and function.   Interval History:   Jose Edwards presents here now for second hospital follow-up after 3 months.  He was not able to tolerate Entresto side effect issues.  Was put back on losartan.  He is recovering from recent URI symptoms with cough and cold symptoms, but has not really had any significant cardiac symptoms.  He has a little nosebleed with blowing his nose during the cold, but has not had any significant bleeding issues otherwise.  He does have a little bit of exercise intolerance and fatigue, but denies any chest pain or pressure exertion.  He denies any PND orthopnea or edema as well.  CV Review of Symptoms (Summary) Cardiovascular ROS: no chest pain or dyspnea on exertion positive for - -rare palpitations; mild nosebleed.  End of day swelling that goes down with foot elevation. negative for - orthopnea, paroxysmal nocturnal dyspnea, rapid heart  rate, shortness of breath, or lightheadedness, dizziness or wooziness, syncope/near syncope or TIA/amaurosis fugax or claudication.  REVIEWED OF SYSTEMS   Review of Systems  Constitutional:  Negative for malaise/fatigue (Just got over a cold, but otherwise doing okay.) and weight loss.  HENT:  Positive for nosebleeds (Mild, related to blowing his nose related.).   Respiratory:  Positive for cough. Negative for hemoptysis and shortness of breath.        Recent URI.  Clearing up.  Cardiovascular:        Per HPI  Gastrointestinal:  Negative for blood in stool and melena.  Genitourinary:  Negative for hematuria.  Musculoskeletal:  Negative for falls, joint pain and myalgias.  Neurological:  Negative for dizziness, focal weakness and headaches.  Psychiatric/Behavioral: Negative.     I have reviewed and (if needed) personally updated the patient's problem list, medications, allergies, past medical and  surgical history, social and family history.   PAST MEDICAL HISTORY   Past Medical History:  Diagnosis Date   Acute ST elevation myocardial infarction (STEMI) of anterolateral wall (HCC) 11/26/2020   100% D1 - DES PCI 2.25 x 16 (2.4 mm)   Benign positional vertigo    CAD S/P DES PCI D1  11/27/2020   100% D1 (DES PCI with Onyx Frontier DES 2.25 x 18 - deployed to 2.4 mm)   Coronary artery disease involving native coronary artery of native heart with angina pectoris ([place]) 11/26/2020   8/29 (Ant-lat STEMI): Cath => Culprit lesion-100% D1 (DES PCI with Onyx Frontier DES 2.25 x 18 - deployed to 2.4 mm). Mid LM 30%, Ost-prox LAD 30% & RI 30%.     ED (erectile dysfunction)    GERD (gastroesophageal reflux disease)    Osteoarthrosis, unspecified whether generalized or localized, unspecified site     PAST SURGICAL HISTORY   Past Surgical History:  Procedure Laterality Date   ANKLE SURGERY  03/31/2006   fracture left ankle ( 2 pins)   CORONARY STENT INTERVENTION N/A 11/26/2020   Procedure:  CORONARY STENT INTERVENTION;  Surgeon: Lorretta Harp, MD;  Location: [place] CV LAB;  Service: Cardiovascular;  AntLat STEMI: (Culprit) D1 100% thrombotic. ->  DES PCI (Onyx Frontier DES 2.25 mm x 18 mm)   LEFT HEART CATH AND CORONARY ANGIOGRAPHY N/A 11/26/2020   Procedure: LEFT HEART CATH AND CORONARY ANGIOGRAPHY;  Surgeon: Lorretta Harp, MD;  Location: [place] CV LAB;  Service: Cardiovascular; Antlat STEMI: Ostial-mid LM 30%.  RI 30%. ostial-proximal LAD 30%.  (Culprit) D1 100% thrombotic. ->  DES PCI   ROTATOR CUFF REPAIR  04/01/2003   right shoulder    Immunization History  Administered Date(s) Administered   Influenza,inj,Quad PF,6+ Mos 01/11/2019, 03/01/2021   Influenza-Unspecified 12/22/2016, 01/19/2018   Td 03/31/2008    MEDICATIONS/ALLERGIES   Current Meds  Medication Sig   aspirin 81 MG EC tablet Take 1 tablet (81 mg total) by mouth daily. Swallow whole.   atorvastatin (LIPITOR) 80 MG tablet Take 1 tablet (80 mg total) by mouth daily.   benzonatate (TESSALON PERLES) 100 MG capsule 1-2 capsules every  8 hours as needed for cough   Cholecalciferol (VITAMIN D3) 50 MCG (2000 UT) TABS Take 2,000 Units by mouth 2 (two) times a week.   clopidogrel (PLAVIX) 75 MG tablet Take 8 tablets (600 mg total) by mouth daily for 1 day, THEN 1 tablet (75 mg total) daily.   dapagliflozin propanediol (FARXIGA) 10 MG TABS tablet Take 1 tablet (10 mg total) by mouth daily before breakfast.   fluticasone (FLONASE) 50 MCG/ACT nasal spray Place 2 sprays into both nostrils daily.   losartan (COZAAR) 50 MG tablet Take 1 tablet (50 mg total) by mouth daily.   metoprolol succinate (TOPROL-XL) 50 MG 24 hr tablet Take 1 tablet (50 mg total) by mouth daily. Take with or immediately following a meal.   nitroGLYCERIN (NITROSTAT) 0.4 MG SL tablet Place 1 tablet (0.4 mg total) under the tongue every 5 (five) minutes as needed for chest pain.   omeprazole (PRILOSEC OTC) 20 MG tablet Take 20 mg by  mouth daily as needed (for heartburn).   vitamin B-12 (CYANOCOBALAMIN) 100 MCG tablet Take 100 mcg by mouth every other day.    vitamin C (ASCORBIC ACID) 500 MG tablet Take 500-1,000 mg by mouth daily.   [DISCONTINUED] losartan (COZAAR) 25 MG tablet Take 1 tablet (25 mg total) by mouth daily.  Allergies  Allergen Reactions   Gabapentin Other (See Comments)    Causes aggressive feelings   Pseudoephedrine Other (See Comments)    Makes the patient drowsy    SOCIAL HISTORY/FAMILY HISTORY   Reviewed in Epic:  Pertinent findings:  Social History   Tobacco Use   Smoking status: Never   Smokeless tobacco: Never  Vaping Use   Vaping Use: Never used  Substance Use Topics   Alcohol use: Yes    Comment: Beer socially (once weekly)   Drug use: No   Social History   Social History Narrative   Lives with wife in a one story home.  Has 3 sons.  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  Education: high school.    OBJCTIVE -PE, EKG, labs   Wt Readings from Last 3 Encounters:  03/12/21 226 lb (102.5 kg)  01/31/21 225 lb 6.4 oz (102.2 kg)  12/12/20 224 lb (101.6 kg)    Physical Exam: BP 134/72   Pulse 96   Ht 5\' 7"  (1.702 m)   Wt 226 lb (102.5 kg)   SpO2 98%   BMI 35.40 kg/m  Physical Exam Vitals reviewed.  Constitutional:      General: He is not in acute distress.    Appearance: Normal appearance. He is obese. He is not ill-appearing or toxic-appearing.  HENT:     Head: Normocephalic and atraumatic.  Neck:     Vascular: No carotid bruit, hepatojugular reflux or JVD.  Cardiovascular:     Rate and Rhythm: Normal rate and regular rhythm. No extrasystoles are present.    Chest Wall: PMI is not displaced.     Pulses: Normal pulses.     Heart sounds: S1 normal and S2 normal. No murmur (Cannot exclude soft 1/6 SEM at RUSB.) heard.   No friction rub. No gallop.  Pulmonary:     Effort: Pulmonary effort is normal. No respiratory distress.     Breath sounds: Normal  breath sounds.  Musculoskeletal:        General: No swelling. Normal range of motion.     Cervical back: Normal range of motion and neck supple.  Skin:    General: Skin is warm and dry.  Neurological:     General: No focal deficit present.     Mental Status: He is alert and oriented to person, place, and time.     Gait: Gait normal.  Psychiatric:        Mood and Affect: Mood normal.        Behavior: Behavior normal.        Thought Content: Thought content normal.        Judgment: Judgment normal.     Adult ECG Report N/a  Recent Labs:  reviewed - due for f/u labs in April 2023.   Lab Results  Component Value Date   CHOL 124 01/09/2021   HDL 41 01/09/2021   LDLCALC 70 01/09/2021   LDLDIRECT 155.1 02/27/2010   TRIG 59 01/09/2021   CHOLHDL 3.0 01/09/2021   Lab Results  Component Value Date   CREATININE 0.85 12/18/2020   BUN 16 12/18/2020   NA 142 12/18/2020   K 4.3 12/18/2020   CL 105 12/18/2020   CO2 20 12/18/2020   CBC Latest Ref Rng & Units 11/27/2020 11/26/2020 01/19/2019  WBC 4.0 - 10.5 K/uL 9.1 5.7 4.8  Hemoglobin 13.0 - 17.0 g/dL 13.5 13.9 14.5  Hematocrit 39.0 - 52.0 % 40.6 40.4 42.3  Platelets 150 - 400 K/uL  231 224 246    Lab Results  Component Value Date   HGBA1C 5.6 11/26/2020   Lab Results  Component Value Date   TSH 1.41 02/27/2010    ==================================================  COVID-19 Education: The signs and symptoms of COVID-19 were discussed with the patient and how to seek care for testing (follow up with PCP or arrange E-visit).    I spent a total of 42 minutes with the patient spent in direct patient consultation.  Additional time spent with chart review  / charting (studies, outside notes, etc): 16 min Total Time: 58 min  Current medicines are reviewed at length with the patient today.  (+/- concerns) n/a  This visit occurred during the SARS-CoV-2 public health emergency.  Safety protocols were in place, including screening  questions prior to the visit, additional usage of staff PPE, and extensive cleaning of exam room while observing appropriate contact time as indicated for disinfecting solutions.  Notice: This dictation was prepared with Dragon dictation along with smart phrase technology. Any transcriptional errors that result from this process are unintentional and may not be corrected upon review.  Studies Ordered:   Orders Placed This Encounter  Procedures   Lipid panel   Comprehensive metabolic panel   ECHOCARDIOGRAM COMPLETE    Patient Instructions / Medication Changes & Studies & Tests Ordered   Patient Instructions  Medication Instructions:  Stop taking 25 mg losaratan  Increase Losartan 50 mg daily   *If you need a refill on your cardiac medications before your next appointment, please call your pharmacy*   Lab Work: Labs in April 2023 Cmp  Lipids  If you have labs (blood work) drawn today and your tests are completely normal, you will receive your results only by: MyChart Message (if you have MyChart) OR A paper copy in the mail If you have any lab test that is abnormal or we need to change your treatment, we will call you to review the results.   Testing/Procedures:  Will be schedule at [address] Your physician has requested that you have an echocardiogram. Echocardiography is a painless test that uses sound waves to create images of your heart. It provides your doctor with information about the size and shape of your heart and how well your heart's chambers and valves are working. This procedure takes approximately one hour. There are no restrictions for this procedure.   Follow-Up: At [facility], you and your health needs are our priority.  As part of our continuing mission to provide you with exceptional heart care, we have created designated Provider Care Teams.  These Care Teams include your primary Cardiologist (physician) and Advanced Practice  Providers (APPs -  Physician Assistants and Nurse Practitioners) who all work together to provide you with the care you need, when you need it.     Your next appointment:   4 month(s)  The format for your next appointment:   In Person  Provider:   Lollie Marrow    Other Instructions      Bryan Lemma, M.D., M.S. Interventional Cardiologist   Pager # [phone] Phone # [phone] [address] [place], [place] [address]   Thank you for choosing Heartcare at [facility]!!

## 2021-03-11 ENCOUNTER — Other Ambulatory Visit: Payer: Self-pay

## 2021-03-11 ENCOUNTER — Encounter: Payer: BC Managed Care – PPO | Admitting: *Deleted

## 2021-03-11 DIAGNOSIS — Z955 Presence of coronary angioplasty implant and graft: Secondary | ICD-10-CM

## 2021-03-11 DIAGNOSIS — I213 ST elevation (STEMI) myocardial infarction of unspecified site: Secondary | ICD-10-CM | POA: Diagnosis not present

## 2021-03-11 NOTE — Progress Notes (Signed)
Completed initial RD consultation ?

## 2021-03-11 NOTE — Progress Notes (Signed)
Daily Session Note  Patient Details  Name: Jose Edwards MRN: [id number] Date of Birth: 28-Jan-1956 Referring Provider:   Flowsheet Row Cardiac Rehab from 01/31/2021 in [facility] Cardiac and Pulmonary Rehab  Referring Provider Gwenlyn Found       Encounter Date: 03/11/2021  Check In:  Session Check In - 03/11/21 1705       Check-In   Supervising physician immediately available to respond to emergencies See telemetry face sheet for immediately available ER MD    Location [facility]-Cardiac & Pulmonary Rehab    Staff Present Renita Papa, RN BSN;Joseph Tessie Fass, RCP,RRT,BSRT;Kara [place], [place], ASCM CEP, Exercise Physiologist    Virtual Visit No    Medication changes reported     No    Fall or balance concerns reported    No    Warm-up and Cool-down Performed on first and last piece of equipment    Resistance Training Performed Yes    VAD Patient? No    PAD/SET Patient? No      Pain Assessment   Currently in Pain? No/denies                Social History   Tobacco Use  Smoking Status Never  Smokeless Tobacco Never    Goals Met:  Independence with exercise equipment Exercise tolerated well No report of concerns or symptoms today Strength training completed today  Goals Unmet:  Not Applicable  Comments: Pt able to follow exercise prescription today without complaint.  Will continue to monitor for progression.    Dr. Emily Filbert is Medical Director for [place].  Dr. Ottie Glazier is Medical Director for [facility].

## 2021-03-12 ENCOUNTER — Encounter: Payer: Self-pay | Admitting: Cardiology

## 2021-03-12 ENCOUNTER — Ambulatory Visit: Payer: BC Managed Care – PPO | Admitting: Cardiology

## 2021-03-12 VITALS — BP 134/72 | HR 96 | Ht 67.0 in | Wt 226.0 lb

## 2021-03-12 DIAGNOSIS — I255 Ischemic cardiomyopathy: Secondary | ICD-10-CM | POA: Diagnosis not present

## 2021-03-12 DIAGNOSIS — I2109 ST elevation (STEMI) myocardial infarction involving other coronary artery of anterior wall: Secondary | ICD-10-CM | POA: Diagnosis not present

## 2021-03-12 DIAGNOSIS — I25119 Atherosclerotic heart disease of native coronary artery with unspecified angina pectoris: Secondary | ICD-10-CM

## 2021-03-12 DIAGNOSIS — I2510 Atherosclerotic heart disease of native coronary artery without angina pectoris: Secondary | ICD-10-CM | POA: Diagnosis not present

## 2021-03-12 DIAGNOSIS — Z9861 Coronary angioplasty status: Secondary | ICD-10-CM

## 2021-03-12 DIAGNOSIS — I10 Essential (primary) hypertension: Secondary | ICD-10-CM

## 2021-03-12 DIAGNOSIS — E785 Hyperlipidemia, unspecified: Secondary | ICD-10-CM

## 2021-03-12 DIAGNOSIS — E669 Obesity, unspecified: Secondary | ICD-10-CM

## 2021-03-12 MED ORDER — LOSARTAN POTASSIUM 50 MG PO TABS: 50.0000 mg | ORAL_TABLET | Freq: Every day | ORAL | 3 refills | Status: DC

## 2021-03-12 NOTE — Patient Instructions (Signed)
Medication Instructions:  Stop taking 25 mg losaratan  Increase Losartan 50 mg daily   *If you need a refill on your cardiac medications before your next appointment, please call your pharmacy*   Lab Work: Labs in April 2023 Cmp  Lipids  If you have labs (blood work) drawn today and your tests are completely normal, you will receive your results only by: MyChart Message (if you have MyChart) OR A paper copy in the mail If you have any lab test that is abnormal or we need to change your treatment, we will call you to review the results.   Testing/Procedures:  Will be schedule at [address] Your physician has requested that you have an echocardiogram. Echocardiography is a painless test that uses sound waves to create images of your heart. It provides your doctor with information about the size and shape of your heart and how well your hearts chambers and valves are working. This procedure takes approximately one hour. There are no restrictions for this procedure.   Follow-Up: At [facility], you and your health needs are our priority.  As part of our continuing mission to provide you with exceptional heart care, we have created designated Provider Care Teams.  These Care Teams include your primary Cardiologist (physician) and Advanced Practice Providers (APPs -  Physician Assistants and Nurse Practitioners) who all work together to provide you with the care you need, when you need it.     Your next appointment:   4 month(s)  The format for your next appointment:   In Person  Provider:   Lollie Marrow    Other Instructions

## 2021-03-13 ENCOUNTER — Other Ambulatory Visit: Payer: Self-pay

## 2021-03-13 DIAGNOSIS — Z955 Presence of coronary angioplasty implant and graft: Secondary | ICD-10-CM | POA: Diagnosis not present

## 2021-03-13 DIAGNOSIS — I213 ST elevation (STEMI) myocardial infarction of unspecified site: Secondary | ICD-10-CM | POA: Diagnosis not present

## 2021-03-13 NOTE — Progress Notes (Signed)
Daily Session Note  Patient Details  Name: Jose Edwards MRN: [id number] Date of Birth: [birth year] Referring Provider:   Flowsheet Row Cardiac Rehab from 01/31/2021 in [facility] Cardiac and Pulmonary Rehab  Referring Provider Gwenlyn Found       Encounter Date: 03/13/2021  Check In:  Session Check In - 03/13/21 [place]       Check-In   Supervising physician immediately available to respond to emergencies See telemetry face sheet for immediately available ER MD    Location [facility]-Cardiac & Pulmonary Rehab    Staff Present Birdie Sons, MPA, Nino Glow, MS, ASCM CEP, Exercise Physiologist;Meredith Sherryll Burger, RN BSN    Virtual Visit No    Medication changes reported     No    Fall or balance concerns reported    No    Tobacco Cessation No Change    Warm-up and Cool-down Performed on first and last piece of equipment    Resistance Training Performed Yes    VAD Patient? No    PAD/SET Patient? No      Pain Assessment   Currently in Pain? No/denies                Social History   Tobacco Use  Smoking Status Never  Smokeless Tobacco Never    Goals Met:  Independence with exercise equipment Exercise tolerated well No report of concerns or symptoms today Strength training completed today  Goals Unmet:  Not Applicable  Comments: Pt able to follow exercise prescription today without complaint.  Will continue to monitor for progression.    Dr. Emily Filbert is Medical Director for [place].  Dr. Ottie Glazier is Medical Director for [facility] Pulmonary Rehabilitation.

## 2021-03-14 ENCOUNTER — Encounter: Payer: BC Managed Care – PPO | Admitting: *Deleted

## 2021-03-14 DIAGNOSIS — Z955 Presence of coronary angioplasty implant and graft: Secondary | ICD-10-CM

## 2021-03-14 DIAGNOSIS — I213 ST elevation (STEMI) myocardial infarction of unspecified site: Secondary | ICD-10-CM

## 2021-03-14 NOTE — Progress Notes (Signed)
Daily Session Note  Patient Details  Name: Jose Edwards MRN: [id number] Date of Birth: 1956/01/25 Referring Provider:   Flowsheet Row Cardiac Rehab from 01/31/2021 in [facility] Cardiac and Pulmonary Rehab  Referring Provider Gwenlyn Found       Encounter Date: 03/14/2021  Check In:  Session Check In - 03/14/21 1706       Check-In   Supervising physician immediately available to respond to emergencies See telemetry face sheet for immediately available ER MD    Location [facility]-Cardiac & Pulmonary Rehab    Staff Present Renita Papa, RN Vickki Hearing, BA, ACSM CEP, Exercise Physiologist;Kara Eliezer Bottom, MS, ASCM CEP, Exercise Physiologist    Virtual Visit No    Medication changes reported     No    Fall or balance concerns reported    No    Warm-up and Cool-down Performed on first and last piece of equipment    Resistance Training Performed Yes    VAD Patient? No    PAD/SET Patient? No      Pain Assessment   Currently in Pain? No/denies                Social History   Tobacco Use  Smoking Status Never  Smokeless Tobacco Never    Goals Met:  Independence with exercise equipment Exercise tolerated well No report of concerns or symptoms today Strength training completed today  Goals Unmet:  Not Applicable  Comments: Pt able to follow exercise prescription today without complaint.  Will continue to monitor for progression.    Dr. Emily Filbert is Medical Director for [place].  Dr. Ottie Glazier is Medical Director for [facility] Pulmonary Rehabilitation.

## 2021-03-18 ENCOUNTER — Other Ambulatory Visit: Payer: Self-pay

## 2021-03-18 DIAGNOSIS — Z955 Presence of coronary angioplasty implant and graft: Secondary | ICD-10-CM

## 2021-03-18 DIAGNOSIS — I213 ST elevation (STEMI) myocardial infarction of unspecified site: Secondary | ICD-10-CM

## 2021-03-18 NOTE — Progress Notes (Signed)
Daily Session Note  Patient Details  Name: Jose Edwards MRN: [id number] Date of Birth: 1956-02-27 Referring Provider:   Flowsheet Row Cardiac Rehab from 01/31/2021 in [facility] Cardiac and Pulmonary Rehab  Referring Provider Gwenlyn Found       Encounter Date: 03/18/2021  Check In:  Session Check In - 03/18/21 1537       Check-In   Supervising physician immediately available to respond to emergencies See telemetry face sheet for immediately available ER MD    Location [facility]-Cardiac & Pulmonary Rehab    Staff Present Birdie Sons, MPA, Nino Glow, MS, ASCM CEP, Exercise Physiologist;Joseph Tessie Fass, [place]    Virtual Visit No    Medication changes reported     Yes    Comments increased losartan to $RemoveBef'50mg'[id number]$     Fall or balance concerns reported    No    Tobacco Cessation No Change    Warm-up and Cool-down Performed on first and last piece of equipment    Resistance Training Performed Yes    VAD Patient? No    PAD/SET Patient? No      Pain Assessment   Currently in Pain? No/denies                Social History   Tobacco Use  Smoking Status Never  Smokeless Tobacco Never    Goals Met:  Independence with exercise equipment Exercise tolerated well No report of concerns or symptoms today Strength training completed today  Goals Unmet:  Not Applicable  Comments: Pt able to follow exercise prescription today without complaint.  Will continue to monitor for progression.    Dr. Emily Filbert is Medical Director for [place].  Dr. Ottie Glazier is Medical Director for [facility] Pulmonary Rehabilitation.

## 2021-03-21 ENCOUNTER — Other Ambulatory Visit: Payer: Self-pay

## 2021-03-21 ENCOUNTER — Encounter: Payer: BC Managed Care – PPO | Admitting: *Deleted

## 2021-03-21 DIAGNOSIS — Z955 Presence of coronary angioplasty implant and graft: Secondary | ICD-10-CM | POA: Diagnosis not present

## 2021-03-21 DIAGNOSIS — I213 ST elevation (STEMI) myocardial infarction of unspecified site: Secondary | ICD-10-CM | POA: Diagnosis not present

## 2021-03-21 NOTE — Progress Notes (Signed)
Daily Session Note  Patient Details  Name: Jose Edwards MRN: [id number] Date of Birth: March 01, 1956 Referring Provider:   Flowsheet Row Cardiac Rehab from 01/31/2021 in [facility] Cardiac and Pulmonary Rehab  Referring Provider Gwenlyn Found       Encounter Date: 03/21/2021  Check In:  Session Check In - 03/21/21 1657       Check-In   Supervising physician immediately available to respond to emergencies See telemetry face sheet for immediately available ER MD    Location [facility]-Cardiac & Pulmonary Rehab    Staff Present Renita Papa, RN BSN;Joseph Tessie Fass, [place]    Virtual Visit No    Medication changes reported     No    Fall or balance concerns reported    No    Warm-up and Cool-down Performed on first and last piece of equipment    Resistance Training Performed Yes    VAD Patient? No    PAD/SET Patient? No      Pain Assessment   Currently in Pain? No/denies                Social History   Tobacco Use  Smoking Status Never  Smokeless Tobacco Never    Goals Met:  Independence with exercise equipment Exercise tolerated well No report of concerns or symptoms today Strength training completed today  Goals Unmet:  Not Applicable  Comments: Pt able to follow exercise prescription today without complaint.  Will continue to monitor for progression.    Dr. Emily Filbert is Medical Director for [place].  Dr. Ottie Glazier is Medical Director for [facility] Pulmonary Rehabilitation.

## 2021-03-22 ENCOUNTER — Encounter: Payer: Self-pay | Admitting: Cardiology

## 2021-03-22 NOTE — Assessment & Plan Note (Signed)
Noted some atypical features discomfort to his PCP, did not really complain of anything here to me today.  Plan:   Continue beta-blocker at current dose and increase ARB dose to 50 mg daily.  Continue aspirin and Plavix-DAPT x1 year minimum.  Continue high-dose statin.  Continue Farxiga.

## 2021-03-22 NOTE — Assessment & Plan Note (Signed)
Tolerating high-dose atorvastatin.  Labs as of October showed LDL down to 70.  Will be due for labs to be rechecked in April 2023.   Plan: Continue current dose of atorvastatin 80 mg daily.  No myalgias or arthralgias.

## 2021-03-22 NOTE — Assessment & Plan Note (Signed)
Blood pressure pretty well controlled, but still has room to titrate losartan.   Plan: Increase losartan to 50 mg daily continue current dose of Toprol.

## 2021-03-22 NOTE — Assessment & Plan Note (Signed)
Is on DAPT with aspirin and Plavix.  No bleeding issues.  Plan will be to continue DAPT through August 2023.  At that point would stop aspirin and continue Plavix another year.

## 2021-03-22 NOTE — Assessment & Plan Note (Signed)
Would hope that EF will improve on optimize medical management.  Plan will be to recheck echocardiogram later this month.  Did not tolerate Entresto-was able to go back to losartan, but would like to titrate further.  Plan:   Increase losartan to 50 mg and continue Toprol 50 mg.  Euvolemic on exam ->   No diuretic required beyond procedure.  Echocardiogram ordered for end of the month.

## 2021-03-22 NOTE — Assessment & Plan Note (Signed)
The patient understands the need to lose weight with diet and exercise. We have discussed specific strategies for this.  

## 2021-03-27 ENCOUNTER — Encounter: Payer: Self-pay | Admitting: *Deleted

## 2021-03-27 DIAGNOSIS — I213 ST elevation (STEMI) myocardial infarction of unspecified site: Secondary | ICD-10-CM

## 2021-03-27 DIAGNOSIS — Z955 Presence of coronary angioplasty implant and graft: Secondary | ICD-10-CM

## 2021-03-27 NOTE — Progress Notes (Signed)
Cardiac Individual Treatment Plan  Patient Details  Name: Jose Edwards MRN: [id number] Date of Birth: [birth year] Referring Provider:   Flowsheet Row Cardiac Rehab from 01/31/2021 in [facility] Cardiac and Pulmonary Rehab  Referring Provider Gwenlyn Found       Initial Encounter Date:  Flowsheet Row Cardiac Rehab from 01/31/2021 in [facility] Cardiac and Pulmonary Rehab  Date 01/31/21       Visit Diagnosis: ST elevation myocardial infarction (STEMI), unspecified artery ([place])  Status post coronary artery stent placement  Patient's Home Medications on Admission:  Current Outpatient Medications:    aspirin 81 MG EC tablet, Take 1 tablet (81 mg total) by mouth daily. Swallow whole., Disp: 90 tablet, Rfl: 3   atorvastatin (LIPITOR) 80 MG tablet, Take 1 tablet (80 mg total) by mouth daily., Disp: 90 tablet, Rfl: 3   benzonatate (TESSALON PERLES) 100 MG capsule, 1-2 capsules every  8 hours as needed for cough, Disp: 30 capsule, Rfl: 0   Cholecalciferol (VITAMIN D3) 50 MCG (2000 UT) TABS, Take 2,000 Units by mouth 2 (two) times a week., Disp: , Rfl:    clopidogrel (PLAVIX) 75 MG tablet, Take 8 tablets (600 mg total) by mouth daily for 1 day, THEN 1 tablet (75 mg total) daily., Disp: 98 tablet, Rfl: 3   dapagliflozin propanediol (FARXIGA) 10 MG TABS tablet, Take 1 tablet (10 mg total) by mouth daily before breakfast., Disp: 30 tablet, Rfl: 11   fluticasone (FLONASE) 50 MCG/ACT nasal spray, Place 2 sprays into both nostrils daily., Disp: 16 g, Rfl: 2   losartan (COZAAR) 50 MG tablet, Take 1 tablet (50 mg total) by mouth daily., Disp: 90 tablet, Rfl: 3   metoprolol succinate (TOPROL-XL) 50 MG 24 hr tablet, Take 1 tablet (50 mg total) by mouth daily. Take with or immediately following a meal., Disp: 30 tablet, Rfl: 11   nitroGLYCERIN (NITROSTAT) 0.4 MG SL tablet, Place 1 tablet (0.4 mg total) under the tongue every 5 (five) minutes as needed for chest pain., Disp: 25 tablet, Rfl: 1   omeprazole (PRILOSEC  OTC) 20 MG tablet, Take 20 mg by mouth daily as needed (for heartburn)., Disp: , Rfl:    vitamin B-12 (CYANOCOBALAMIN) 100 MCG tablet, Take 100 mcg by mouth every other day. , Disp: , Rfl:    vitamin C (ASCORBIC ACID) 500 MG tablet, Take 500-1,000 mg by mouth daily., Disp: , Rfl:   Past Medical History: Past Medical History:  Diagnosis Date   Acute ST elevation myocardial infarction (STEMI) of anterolateral wall ([place]) 11/26/2020   100% D1 - DES PCI 2.25 x 16 (2.4 mm)   Benign positional vertigo    CAD S/P DES PCI D1  11/27/2020   100% D1 (DES PCI with Onyx Frontier DES 2.25 x 18 - deployed to 2.4 mm)   Coronary artery disease involving native coronary artery of native heart with angina pectoris ([place]) 11/26/2020   8/29 ([place] STEMI): Cath => Culprit lesion-100% D1 (DES PCI with Onyx Frontier DES 2.25 x 18 - deployed to 2.4 mm). Mid LM 30%, Ost-prox LAD 30% & RI 30%.     ED (erectile dysfunction)    GERD (gastroesophageal reflux disease)    Osteoarthrosis, unspecified whether generalized or localized, unspecified site     Tobacco Use: Social History   Tobacco Use  Smoking Status Never  Smokeless Tobacco Never    Labs: Recent Review Flowsheet Data     Labs for ITP Cardiac and Pulmonary Rehab Latest Ref Rng & Units 02/27/2010 06/21/2013 05/21/2016  11/26/2020 01/09/2021   Cholestrol 100 - 199 mg/dL 221(H) 205(A) 200 198 124   LDLCALC 0 - 99 mg/dL - 141 143(H) 134(H) 70   LDLDIRECT mg/dL 155.1 - - - -   HDL >39 mg/dL 39.20 - 39.40 38(L) 41   Trlycerides 0 - 149 mg/dL 158.0(H) - 88.0 132 59   Hemoglobin A1c 4.8 - 5.6 % - - - 5.6 -        Exercise Target Goals: Exercise Program Goal: Individual exercise prescription set using results from initial 6 min walk test and THRR while considering  patients activity barriers and safety.   Exercise Prescription Goal: Initial exercise prescription builds to 30-45 minutes a day of aerobic activity, 2-3 days per week.  Home exercise guidelines  will be given to patient during program as part of exercise prescription that the participant will acknowledge.   Education: Aerobic Exercise: - Group verbal and visual presentation on the components of exercise prescription. Introduces F.I.T.T principle from ACSM for exercise prescriptions.  Reviews F.I.T.T. principles of aerobic exercise including progression. Written material given at graduation.   Education: Resistance Exercise: - Group verbal and visual presentation on the components of exercise prescription. Introduces F.I.T.T principle from ACSM for exercise prescriptions  Reviews F.I.T.T. principles of resistance exercise including progression. Written material given at graduation.    Education: Exercise & Equipment Safety: - Individual verbal instruction and demonstration of equipment use and safety with use of the equipment. Flowsheet Row Cardiac Rehab from 03/13/2021 in [facility] Cardiac and Pulmonary Rehab  Date 01/31/21  Educator AS  Instruction Review Code 1- Verbalizes Understanding       Education: Exercise Physiology & General Exercise Guidelines: - Group verbal and written instruction with models to review the exercise physiology of the cardiovascular system and associated critical values. Provides general exercise guidelines with specific guidelines to those with heart or lung disease.    Education: Flexibility, Balance, Mind/Body Relaxation: - Group verbal and visual presentation with interactive activity on the components of exercise prescription. Introduces F.I.T.T principle from ACSM for exercise prescriptions. Reviews F.I.T.T. principles of flexibility and balance exercise training including progression. Also discusses the mind body connection.  Reviews various relaxation techniques to help reduce and manage stress (i.e. Deep breathing, progressive muscle relaxation, and visualization). Balance handout provided to take home. Written material given at  graduation. Flowsheet Row Cardiac Rehab from 03/13/2021 in [facility] Cardiac and Pulmonary Rehab  Date 02/06/21  Educator AS  Instruction Review Code 1- Verbalizes Understanding       Activity Barriers & Risk Stratification:  Activity Barriers & Cardiac Risk Stratification - 01/16/21 1416       Activity Barriers & Cardiac Risk Stratification   Activity Barriers Other (comment)    Comments left ankle has screws    Cardiac Risk Stratification Moderate             6 Minute Walk:  6 Minute Walk     Row Name 01/31/21 1627         6 Minute Walk   Phase Initial     Distance 1485 feet     Walk Time 6 minutes     # of Rest Breaks 0     MPH 2.8     METS 3.6     RPE 11     Perceived Dyspnea  1     VO2 Peak 12.47     Symptoms No     Resting HR 80 bpm     Resting BP  118/60     Resting Oxygen Saturation  97 %     Exercise Oxygen Saturation  during 6 min walk 97 %     Max Ex. HR 110 bpm     Max Ex. BP 190/76     2 Minute Post BP 108/54              Oxygen Initial Assessment:   Oxygen Re-Evaluation:   Oxygen Discharge (Final Oxygen Re-Evaluation):   Initial Exercise Prescription:  Initial Exercise Prescription - 01/31/21 1600       Date of Initial Exercise RX and Referring Provider   Date 01/31/21    Referring Provider Gwenlyn Found      Oxygen   Maintain Oxygen Saturation 88% or higher      Treadmill   MPH 2.8    Grade 1    Minutes 15    METs 3.6      Recumbant Bike   Level 3    RPM 60    Watts 51    Minutes 15    METs 3.6      NuStep   Level 3    SPM 80    Minutes 15    METs 3.6      REL-XR   Level 3    Speed 50    Minutes 15    METs 3.6      Prescription Details   Frequency (times per week) 3    Duration Progress to 30 minutes of continuous aerobic without signs/symptoms of physical distress      Intensity   THRR 40-80% of Max Heartrate 110-139    Ratings of Perceived Exertion 11-15    Perceived Dyspnea 0-4      Resistance Training    Training Prescription Yes    Weight 4 lb    Reps 10-15             Perform Capillary Blood Glucose checks as needed.  Exercise Prescription Changes:   Exercise Prescription Changes     Row Name 01/31/21 1600 02/12/21 1400 02/25/21 1200 02/25/21 1700 03/13/21 1200     Response to Exercise   Blood Pressure (Admit) 118/60 124/68 128/62 -- 132/72   Blood Pressure (Exercise) 190/76 146/68 -- -- --   Blood Pressure (Exit) 108/54 122/64 100/60 -- 128/60   Heart Rate (Admit) 80 bpm 70 bpm 78 bpm -- 75 bpm   Heart Rate (Exercise) 110 bpm 11 bpm 115 bpm -- 113 bpm   Heart Rate (Exit) 80 bpm 54 bpm 87 bpm -- 80 bpm   Oxygen Saturation (Admit) 97 % -- -- -- --   Oxygen Saturation (Exercise) 97 % -- -- -- --   Rating of Perceived Exertion (Exercise) 11 13 13  -- 12   Perceived Dyspnea (Exercise) 1 -- -- -- --   Symptoms none none none -- none   Duration -- Continue with 30 min of aerobic exercise without signs/symptoms of physical distress. Continue with 30 min of aerobic exercise without signs/symptoms of physical distress. -- Continue with 30 min of aerobic exercise without signs/symptoms of physical distress.   Intensity -- THRR unchanged THRR unchanged -- THRR unchanged     Progression   Progression -- Continue to progress workloads to maintain intensity without signs/symptoms of physical distress. Continue to progress workloads to maintain intensity without signs/symptoms of physical distress. -- Continue to progress workloads to maintain intensity without signs/symptoms of physical distress.   Average METs -- 3.3 3.73 -- 4  Resistance Training   Training Prescription -- Yes Yes -- Yes   Weight -- 4 lb 4 lb -- 4 lb   Reps -- 10-15 10-15 -- 10-15     Interval Training   Interval Training -- -- No -- No     Treadmill   MPH -- -- 3.2 -- 3.2   Grade -- -- 1 -- 0.5   Minutes -- -- 15 -- 15   METs -- -- 3.89 -- 3.67     Recumbant Bike   Level -- -- 3 -- --   Watts -- -- 32  -- --   Minutes -- -- 15 -- --     NuStep   Level -- -- 3 -- --   Minutes -- -- 15 -- --   METs -- -- 3.6 -- --     REL-XR   Level -- -- 4 -- 5   Minutes -- -- 15 -- 15   METs -- -- 4.2 -- 4.5     Home Exercise Plan   Plans to continue exercise at -- -- -- [employer] (comment)  gym at work [employer] (comment)  gym at work   Frequency -- -- -- Add 2 additional days to program exercise sessions.  start with 1 Add 2 additional days to program exercise sessions.  start with 1   Initial Home Exercises Provided -- -- -- 02/25/21 02/25/21            Exercise Comments:   Exercise Goals and Review:   Exercise Goals     Row Name 01/31/21 1634             Exercise Goals   Increase Physical Activity Yes       Intervention Provide advice, education, support and counseling about physical activity/exercise needs.;Develop an individualized exercise prescription for aerobic and resistive training based on initial evaluation findings, risk stratification, comorbidities and participant's personal goals.       Expected Outcomes Short Term: Attend rehab on a regular basis to increase amount of physical activity.;Long Term: Add in home exercise to make exercise part of routine and to increase amount of physical activity.;Long Term: Exercising regularly at least 3-5 days a week.       Increase Strength and Stamina Yes       Intervention Provide advice, education, support and counseling about physical activity/exercise needs.;Develop an individualized exercise prescription for aerobic and resistive training based on initial evaluation findings, risk stratification, comorbidities and participant's personal goals.       Expected Outcomes Short Term: Increase workloads from initial exercise prescription for resistance, speed, and METs.;Short Term: Perform resistance training exercises routinely during rehab and add in resistance training at home;Long Term: Improve cardiorespiratory  fitness, muscular endurance and strength as measured by increased METs and functional capacity (6MWT)       Able to understand and use rate of perceived exertion (RPE) scale Yes       Intervention Provide education and explanation on how to use RPE scale       Expected Outcomes Short Term: Able to use RPE daily in rehab to express subjective intensity level;Long Term:  Able to use RPE to guide intensity level when exercising independently       Able to understand and use Dyspnea scale Yes       Intervention Provide education and explanation on how to use Dyspnea scale       Expected Outcomes Short Term: Able to use Dyspnea scale  daily in rehab to express subjective sense of shortness of breath during exertion;Long Term: Able to use Dyspnea scale to guide intensity level when exercising independently       Knowledge and understanding of Target Heart Rate Range (THRR) Yes       Intervention Provide education and explanation of THRR including how the numbers were predicted and where they are located for reference       Expected Outcomes Short Term: Able to state/look up THRR;Short Term: Able to use daily as guideline for intensity in rehab;Long Term: Able to use THRR to govern intensity when exercising independently       Able to check pulse independently Yes       Intervention Provide education and demonstration on how to check pulse in carotid and radial arteries.;Review the importance of being able to check your own pulse for safety during independent exercise       Expected Outcomes Short Term: Able to explain why pulse checking is important during independent exercise;Long Term: Able to check pulse independently and accurately       Understanding of Exercise Prescription Yes       Intervention Provide education, explanation, and written materials on patient's individual exercise prescription       Expected Outcomes Short Term: Able to explain program exercise prescription;Long Term: Able to explain  home exercise prescription to exercise independently                Exercise Goals Re-Evaluation :  Exercise Goals Re-Evaluation     Row Name 02/04/21 1700 02/12/21 1420 02/12/21 1424 02/25/21 1203 02/25/21 1737     Exercise Goal Re-Evaluation   Exercise Goals Review Increase Physical Activity;Able to understand and use rate of perceived exertion (RPE) scale;Knowledge and understanding of Target Heart Rate Range (THRR);Understanding of Exercise Prescription;Increase Strength and Stamina;Able to check pulse independently Increase Physical Activity;Increase Strength and Stamina -- Increase Physical Activity;Increase Strength and Stamina Increase Physical Activity;Increase Strength and Stamina;Understanding of Exercise Prescription   Comments Reviewed RPE and dyspnea scales, THR and program prescription with pt today.  Pt voiced understanding and was given a copy of goals to take home. Saahil has done well in his first sessions.  He reaches lower end of THR range.  Staff will monitor progress. -- Dequante is doing well in rehab. He has increased to speed 3.2 and 1% incline on the treadmill. He also went up to level 4 on the XR. Staff should encourage patient to increase his handweights. Will continue to monitor Reviewed home exercise with pt today.  Pt plans to go to his gym at work for exercise. Patient states he has multiple aerobic machines to pick from and is not sure which ones he will use yet. I also encouraged him to keep up with resistance training. Reviewed THR, pulse, RPE, sign and symptoms, pulse oximetery and when to call 911 or MD.  Also discussed weather considerations and indoor options.  Pt voiced understanding.   Expected Outcomes Short: Use RPE daily to regulate intensity. Long: Follow program prescription in THR. -- Short : attend consistently Long:  improve overall stamina Short: Increase handweights Long: Continue to increase overall MET level Short: Start with 1 day of exercise at home  and check HR Long: Exercise independently at homr at appropriate prescription    [place] Name 03/11/21 1745             Exercise Goal Re-Evaluation   Exercise Goals Review Increase Physical Activity  Comments Joni is not exercising outside program sessions.  He  plans to stay after work at [employer] to use the fitness center once he finishes HT       Expected Outcomes Short: continue to exercise consistently Long: maintain exercise on his own                Discharge Exercise Prescription (Final Exercise Prescription Changes):  Exercise Prescription Changes - 03/13/21 1200       Response to Exercise   Blood Pressure (Admit) 132/72    Blood Pressure (Exit) 128/60    Heart Rate (Admit) 75 bpm    Heart Rate (Exercise) 113 bpm    Heart Rate (Exit) 80 bpm    Rating of Perceived Exertion (Exercise) 12    Symptoms none    Duration Continue with 30 min of aerobic exercise without signs/symptoms of physical distress.    Intensity THRR unchanged      Progression   Progression Continue to progress workloads to maintain intensity without signs/symptoms of physical distress.    Average METs 4      Resistance Training   Training Prescription Yes    Weight 4 lb    Reps 10-15      Interval Training   Interval Training No      Treadmill   MPH 3.2    Grade 0.5    Minutes 15    METs 3.67      REL-XR   Level 5    Minutes 15    METs 4.5      Home Exercise Plan   Plans to continue exercise at [employer] (comment)   gym at work   Frequency Add 2 additional days to program exercise sessions.   start with 1   Initial Home Exercises Provided 02/25/21             Nutrition:  Target Goals: Understanding of nutrition guidelines, daily intake of sodium '1500mg'$ , cholesterol '200mg'$ , calories 30% from fat and 7% or less from saturated fats, daily to have 5 or more servings of fruits and vegetables.  Education: All About Nutrition: -Group instruction provided by verbal,  written material, interactive activities, discussions, models, and posters to present general guidelines for heart healthy nutrition including fat, fiber, MyPlate, the role of sodium in heart healthy nutrition, utilization of the nutrition label, and utilization of this knowledge for meal planning. Follow up email sent as well. Written material given at graduation. Flowsheet Row Cardiac Rehab from 03/13/2021 in [facility] Cardiac and Pulmonary Rehab  Education need identified 01/31/21  Date 02/13/21  Educator Pass Christian  Instruction Review Code 1- Verbalizes Understanding       Biometrics:  Pre Biometrics - 01/31/21 1635       Pre Biometrics   Height $Remov'5\' 7"'sKxYaS$  (1.702 m)    Weight 225 lb 6.4 oz (102.2 kg)    BMI (Calculated) 35.29    Single Leg Stand 20 seconds              Nutrition Therapy Plan and Nutrition Goals:  Nutrition Therapy & Goals - 03/11/21 1708       Nutrition Therapy   Diet Heart healthy, low Na    Drug/Food Interactions Statins/Certain Fruits    Protein (specify units) 80g    Fiber 30 grams    Whole Grain Foods 3 servings    Saturated Fats 12 max. grams    Fruits and Vegetables 8 servings/day    Sodium 1.5 grams  Personal Nutrition Goals   Nutrition Goal ST: montior added salt to processed food and cooking, honor hunger - when not eating lunch, make sure to have a satiating snack with protein, fat, and/or fiber (ex: vegetables with hummus, fruit with yogurt) LT: limit Na <1.5g, avoid hunger at night    Comments PYP: 62. He doesn't eat fried food most of the time, most of the time it is baked or grilled. He declines french fries when he goes out to eat. He will get vegetables when going out to eat. He goes out to eat 2x/week at work. He tried to cut down on portion sizes and snacks. He does not eat right away, will have a nutrition bar for breakfast L: may or may not have lunch - most of the time it is leftover D: spaghetti, or grilled food, with vegetables (right now he  loves roasted tomatoes, but he reports getting a good variety of vegetables), potatoes a lot. grilled chicken, sometimes steak, sometimes salmon or shrimp. His wife will use only some salt during cooking, he uses pepper on most foods. He doesn't know if she uses canola or olive oil. He reports getting very hungry at night when he doesn't eat lunch. He likes sweet pickled, but he cut some out and switched to dill pickles, mentioned the main concern there would be salt content. Discussed sodium intake and honoring hunger as well as heart healthy eating.      Intervention Plan   Intervention Prescribe, educate and counsel regarding individualized specific dietary modifications aiming towards targeted core components such as weight, hypertension, lipid management, diabetes, heart failure and other comorbidities.;Nutrition handout(s) given to patient.    Expected Outcomes Short Term Goal: Understand basic principles of dietary content, such as calories, fat, sodium, cholesterol and nutrients.;Short Term Goal: A plan has been developed with personal nutrition goals set during dietitian appointment.;Long Term Goal: Adherence to prescribed nutrition plan.             Nutrition Assessments:  MEDIFICTS Score Key: ?70 Need to make dietary changes  40-70 Heart Healthy Diet ? 40 Therapeutic Level Cholesterol Diet  Flowsheet Row Cardiac Rehab from 01/31/2021 in [facility] Cardiac and Pulmonary Rehab  Picture Your Plate Total Score on Admission 63      Picture Your Plate Scores: <52 Unhealthy dietary pattern with much room for improvement. 41-50 Dietary pattern unlikely to meet recommendations for good health and room for improvement. 51-60 More healthful dietary pattern, with some room for improvement.  >60 Healthy dietary pattern, although there may be some specific behaviors that could be improved.    Nutrition Goals Re-Evaluation:   Nutrition Goals Discharge (Final Nutrition Goals  Re-Evaluation):   Psychosocial: Target Goals: Acknowledge presence or absence of significant depression and/or stress, maximize coping skills, provide positive support system. Participant is able to verbalize types and ability to use techniques and skills needed for reducing stress and depression.   Education: Stress, Anxiety, and Depression - Group verbal and visual presentation to define topics covered.  Reviews how body is impacted by stress, anxiety, and depression.  Also discusses healthy ways to reduce stress and to treat/manage anxiety and depression.  Written material given at graduation. Flowsheet Row Cardiac Rehab from 03/13/2021 in [facility] Cardiac and Pulmonary Rehab  Date 03/13/21  Educator [facility]  Instruction Review Code 1- Verbalizes Understanding       Education: Sleep Hygiene -Provides group verbal and written instruction about how sleep can affect your health.  Define sleep hygiene, discuss  sleep cycles and impact of sleep habits. Review good sleep hygiene tips.    Initial Review & Psychosocial Screening:  Initial Psych Review & Screening - 01/16/21 1419       Initial Review   Current issues with None Identified      Family Dynamics   Good Support System? Yes   wife, family, coworkers     Barriers   Psychosocial barriers to participate in program There are no identifiable barriers or psychosocial needs.;The patient should benefit from training in stress management and relaxation.      Screening Interventions   Interventions Encouraged to exercise;Provide feedback about the scores to participant;To provide support and resources with identified psychosocial needs    Expected Outcomes Short Term goal: Utilizing psychosocial counselor, staff and physician to assist with identification of specific Stressors or current issues interfering with healing process. Setting desired goal for each stressor or current issue identified.;Long Term Goal: Stressors or current issues are  controlled or eliminated.;Short Term goal: Identification and review with participant of any Quality of Life or Depression concerns found by scoring the questionnaire.;Long Term goal: The participant improves quality of Life and PHQ9 Scores as seen by post scores and/or verbalization of changes             Quality of Life Scores:   Quality of Life - 01/31/21 1640       Quality of Life   Select Quality of Life      Quality of Life Scores   Health/Function Pre 21.2 %    Socioeconomic Pre 24.43 %    Psych/Spiritual Pre 22.86 %    Family Pre 25.2 %    GLOBAL Pre 22.79 %            Scores of 19 and below usually indicate a poorer quality of life in these areas.  A difference of  2-3 points is a clinically meaningful difference.  A difference of 2-3 points in the total score of the Quality of Life Index has been associated with significant improvement in overall quality of life, self-image, physical symptoms, and general health in studies assessing change in quality of life.  PHQ-9: Recent Review Flowsheet Data     Depression screen [facility] 2/9 01/31/2021   Decreased Interest 0   Down, Depressed, Hopeless 0   PHQ - 2 Score 0   Altered sleeping 1   Tired, decreased energy 0   Change in appetite 0   Feeling bad or failure about yourself  0   Trouble concentrating 0   Moving slowly or fidgety/restless 0   Suicidal thoughts 0   PHQ-9 Score 1   Difficult doing work/chores Not difficult at all      Interpretation of Total Score  Total Score Depression Severity:  1-4 = Minimal depression, 5-9 = Mild depression, 10-14 = Moderate depression, 15-19 = Moderately severe depression, 20-27 = Severe depression   Psychosocial Evaluation and Intervention:  Psychosocial Evaluation - 01/16/21 1427       Psychosocial Evaluation & Interventions   Interventions Encouraged to exercise with the program and follow exercise prescription    Comments Sherrill reports 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 He  does struggle with some fatigue but thinks its coming from his medications. Prior to his MI, he was on vitamins and GERD medicine, so a lot of these are new to him. He has been back to work full time at [employer] for 2 weeks now, still on light duty in the shop, and reports  he is feeling well. He does not report any stress concerns. His sleep issues are not new, but some nights it is difficult to go to sleep. He has a great support system at home and at work. He is very motivated to make the changes needed to have a heart healthy lifestyle.    Expected Outcomes Short: attend cardiac rehab for education and exercise. Long: develop and maintain positive self care habits.    Continue Psychosocial Services  Follow up required by staff             Psychosocial Re-Evaluation:  Psychosocial Re-Evaluation     [place] Name 03/11/21 1741             Psychosocial Re-Evaluation   Current issues with Current Stress Concerns;Current Sleep Concerns       Comments Ashir feels he has more energy.  He gets a little short of breath if he has to bend over.  Going up steps he doesn't get short of breath.  (even carrying tools)  He sleeps well most of the time.  He had a nosebleed Friday night .  He has had these before - it took longer to stop since he is on a blood thinner.  Staff recommended he talk to his Dr if that gets worse       Expected Outcomes Short:  continue to exercise to help manage stress Long: maintain positive outlook                Psychosocial Discharge (Final Psychosocial Re-Evaluation):  Psychosocial Re-Evaluation - 03/11/21 1741       Psychosocial Re-Evaluation   Current issues with Current Stress Concerns;Current Sleep Concerns    Comments Jermall feels he has more energy.  He gets a little short of breath if he has to bend over.  Going up steps he doesn't get short of breath.  (even carrying tools)  He sleeps well most of the time.  He had a nosebleed Friday night .  He has had these  before - it took longer to stop since he is on a blood thinner.  Staff recommended he talk to his Dr if that gets worse    Expected Outcomes Short:  continue to exercise to help manage stress Long: maintain positive outlook             Vocational Rehabilitation: Provide vocational rehab assistance to qualifying candidates.   Vocational Rehab Evaluation & Intervention:  Vocational Rehab - 01/16/21 1419       Initial Vocational Rehab Evaluation & Intervention   Assessment shows need for Vocational Rehabilitation No             Education: Education Goals: Education classes will be provided on a variety of topics geared toward better understanding of heart health and risk factor modification. Participant will state understanding/return demonstration of topics presented as noted by education test scores.  Learning Barriers/Preferences:  Learning Barriers/Preferences - 01/16/21 1419       Learning Barriers/Preferences   Learning Barriers None    Learning Preferences None             General Cardiac Education Topics:  AED/CPR: - Group verbal and written instruction with the use of models to demonstrate the basic use of the AED with the basic ABC's of resuscitation.   Anatomy and Cardiac Procedures: - Group verbal and visual presentation and models provide information about basic cardiac anatomy and function. Reviews the testing methods done to diagnose heart disease and  the outcomes of the test results. Describes the treatment choices: Medical Management, Angioplasty, or Coronary Bypass Surgery for treating various heart conditions including Myocardial Infarction, Angina, Valve Disease, and Cardiac Arrhythmias.  Written material given at graduation.   Medication Safety: - Group verbal and visual instruction to review commonly prescribed medications for heart and lung disease. Reviews the medication, class of the drug, and side effects. Includes the steps to properly  store meds and maintain the prescription regimen.  Written material given at graduation.   Intimacy: - Group verbal instruction through game format to discuss how heart and lung disease can affect sexual intimacy. Written material given at graduation..   Know Your Numbers and Heart Failure: - Group verbal and visual instruction to discuss disease risk factors for cardiac and pulmonary disease and treatment options.  Reviews associated critical values for Overweight/Obesity, Hypertension, Cholesterol, and Diabetes.  Discusses basics of heart failure: signs/symptoms and treatments.  Introduces Heart Failure Zone chart for action plan for heart failure.  Written material given at graduation. Flowsheet Row Cardiac Rehab from 03/13/2021 in [facility] Cardiac and Pulmonary Rehab  Education need identified 01/31/21  Date 02/27/21  Educator [place]  Instruction Review Code 1- Verbalizes Understanding       Infection Prevention: - Provides verbal and written material to individual with discussion of infection control including proper hand washing and proper equipment cleaning during exercise session. Flowsheet Row Cardiac Rehab from 03/13/2021 in [facility] Cardiac and Pulmonary Rehab  Date 01/31/21  Educator AS  Instruction Review Code 1- Verbalizes Understanding       Falls Prevention: - Provides verbal and written material to individual with discussion of falls prevention and safety. Flowsheet Row Cardiac Rehab from 03/13/2021 in [facility] Cardiac and Pulmonary Rehab  Date 01/31/21  Educator AS  Instruction Review Code 1- Verbalizes Understanding       Other: -Provides group and verbal instruction on various topics (see comments)   Knowledge Questionnaire Score:  Knowledge Questionnaire Score - 01/31/21 1636       Knowledge Questionnaire Score   Pre Score 23/26             Core Components/Risk Factors/Patient Goals at Admission:  Personal Goals and Risk Factors at Admission - 01/31/21  1639       Core Components/Risk Factors/Patient Goals on Admission    Weight Management Yes    Intervention Weight Management/Obesity: Establish reasonable short term and long term weight goals.    Admit Weight 225 lb 6.4 oz (102.2 kg)    Goal Weight: Short Term 220 lb (99.8 kg)    Goal Weight: Long Term 215 lb (97.5 kg)    Expected Outcomes Short Term: Continue to assess and modify interventions until short term weight is achieved;Long Term: Adherence to nutrition and physical activity/exercise program aimed toward attainment of established weight goal;Weight Loss: Understanding of general recommendations for a balanced deficit meal plan, which promotes 1-2 lb weight loss per week and includes a negative energy balance of [phone] kcal/d    Lipids Yes    Intervention Provide education and support for participant on nutrition & aerobic/resistive exercise along with prescribed medications to achieve LDL '70mg'$ , HDL >$Remo'40mg'wWWqJ$ .    Expected Outcomes Short Term: Participant states understanding of desired cholesterol values and is compliant with medications prescribed. Participant is following exercise prescription and nutrition guidelines.;Long Term: Cholesterol controlled with medications as prescribed, with individualized exercise RX and with personalized nutrition plan. Value goals: LDL < $Rem'70mg'KwsX$ , HDL > 40 mg.  Education:Diabetes - Individual verbal and written instruction to review signs/symptoms of diabetes, desired ranges of glucose level fasting, after meals and with exercise. Acknowledge that pre and post exercise glucose checks will be done for 3 sessions at entry of program.   Core Components/Risk Factors/Patient Goals Review:   Goals and Risk Factor Review     Row Name 03/11/21 1737 03/11/21 1739 03/11/21 1745         Core Components/Risk Factors/Patient Goals Review   Personal Goals Review Weight Management/Obesity -- --     Review -- Noemi's weight varies between 220-225.   He has been able to go down one belt notch.  He is taking medications as directed. He sees cardilogist tomorrow for follow up.     Expected Outcomes -- Short: continue to exercise consistently Long: reach goal weight --              Core Components/Risk Factors/Patient Goals at Discharge (Final Review):   Goals and Risk Factor Review - 03/11/21 1745       Core Components/Risk Factors/Patient Goals Review   Review He sees cardilogist tomorrow for follow up.             ITP Comments:  ITP Comments     Row Name 01/16/21 1403 01/31/21 1642 02/04/21 1659 02/27/21 0635 03/11/21 1749   ITP Comments Initial telephone orientation completed. Diagnosis can be found in [facility] 8/29. EP orientation scheduled for Thursday 11/3 at 3pm. Completed 6MWT and gym orientation. Initial ITP created and sent for review to Dr. Emily Filbert, Medical Director. First full day of exercise!  Patient was oriented to gym and equipment including functions, settings, policies, and procedures.  Patient's individual exercise prescription and treatment plan were reviewed.  All starting workloads were established based on the results of the 6 minute walk test done at initial orientation visit.  The plan for exercise progression was also introduced and progression will be customized based on patient's performance and goals. 30 Day review completed. Medical Director ITP review done, changes made as directed, and signed approval by Medical Director. Completed initial RD consultation    [place] Name 03/27/21 0657           ITP Comments 30 Day review completed. Medical Director ITP review done, changes made as directed, and signed approval by Medical Director.                Comments:

## 2021-03-28 ENCOUNTER — Other Ambulatory Visit: Payer: Self-pay

## 2021-03-28 ENCOUNTER — Ambulatory Visit (HOSPITAL_COMMUNITY): Payer: BC Managed Care – PPO | Attending: Cardiology

## 2021-03-28 DIAGNOSIS — Z9861 Coronary angioplasty status: Secondary | ICD-10-CM | POA: Insufficient documentation

## 2021-03-28 DIAGNOSIS — I25119 Atherosclerotic heart disease of native coronary artery with unspecified angina pectoris: Secondary | ICD-10-CM | POA: Insufficient documentation

## 2021-03-28 DIAGNOSIS — I2109 ST elevation (STEMI) myocardial infarction involving other coronary artery of anterior wall: Secondary | ICD-10-CM | POA: Diagnosis not present

## 2021-03-28 DIAGNOSIS — I255 Ischemic cardiomyopathy: Secondary | ICD-10-CM | POA: Diagnosis not present

## 2021-03-28 DIAGNOSIS — I2510 Atherosclerotic heart disease of native coronary artery without angina pectoris: Secondary | ICD-10-CM | POA: Insufficient documentation

## 2021-03-28 LAB — ECHOCARDIOGRAM COMPLETE
Area-P 1/2: 3.56 cm2
S' Lateral: 2.7 cm

## 2021-04-02 HISTORY — PX: TRANSTHORACIC ECHOCARDIOGRAM: SHX275

## 2021-04-02 NOTE — Progress Notes (Signed)
°  Echocardiogram results showed EF improved from 45 to 50% up to 55-60%.  No wall motion abnormalities that were previously seen after the heart attack.  Normal valves.  Normal right ventricular function.  Great news.  Just like we hoped.  Pump function is now back into the normal range.  This means that there is been good recovery of the heart overall from the heart attack.  Bryan Lemma, MD

## 2021-04-03 ENCOUNTER — Encounter: Payer: BC Managed Care – PPO | Attending: Cardiovascular Disease

## 2021-04-03 ENCOUNTER — Other Ambulatory Visit: Payer: Self-pay

## 2021-04-03 DIAGNOSIS — I252 Old myocardial infarction: Secondary | ICD-10-CM | POA: Diagnosis not present

## 2021-04-03 DIAGNOSIS — Z955 Presence of coronary angioplasty implant and graft: Secondary | ICD-10-CM | POA: Insufficient documentation

## 2021-04-03 DIAGNOSIS — Z48812 Encounter for surgical aftercare following surgery on the circulatory system: Secondary | ICD-10-CM | POA: Insufficient documentation

## 2021-04-03 DIAGNOSIS — I213 ST elevation (STEMI) myocardial infarction of unspecified site: Secondary | ICD-10-CM

## 2021-04-03 NOTE — Progress Notes (Signed)
Daily Session Note  Patient Details  Name: Rishab Stoudt MRN: [id number] Date of Birth: Nov 16, 1955 Referring Provider:   Flowsheet Row Cardiac Rehab from 01/31/2021 in [facility] Cardiac and Pulmonary Rehab  Referring Provider Gwenlyn Found       Encounter Date: 04/03/2021  Check In:  Session Check In - 04/03/21 6742       Check-In   Supervising physician immediately available to respond to emergencies See telemetry face sheet for immediately available ER MD    Location [facility]-Cardiac & Pulmonary Rehab    Staff Present Birdie Sons, MPA, Nino Glow, MS, ASCM CEP, Exercise Physiologist;Joseph Tessie Fass, [place]    Virtual Visit No    Medication changes reported     No    Fall or balance concerns reported    No    Tobacco Cessation No Change    Warm-up and Cool-down Performed on first and last piece of equipment    Resistance Training Performed Yes    VAD Patient? No    PAD/SET Patient? No      Pain Assessment   Currently in Pain? No/denies                Social History   Tobacco Use  Smoking Status Never  Smokeless Tobacco Never    Goals Met:  Independence with exercise equipment Exercise tolerated well No report of concerns or symptoms today Strength training completed today  Goals Unmet:  Not Applicable  Comments: Pt able to follow exercise prescription today without complaint.  Will continue to monitor for progression.    Dr. Emily Filbert is Medical Director for [place].  Dr. Ottie Glazier is Medical Director for [facility] Pulmonary Rehabilitation.

## 2021-04-04 ENCOUNTER — Encounter: Payer: BC Managed Care – PPO | Admitting: *Deleted

## 2021-04-04 DIAGNOSIS — Z48812 Encounter for surgical aftercare following surgery on the circulatory system: Secondary | ICD-10-CM | POA: Diagnosis not present

## 2021-04-04 DIAGNOSIS — I213 ST elevation (STEMI) myocardial infarction of unspecified site: Secondary | ICD-10-CM

## 2021-04-04 DIAGNOSIS — Z955 Presence of coronary angioplasty implant and graft: Secondary | ICD-10-CM | POA: Diagnosis not present

## 2021-04-04 DIAGNOSIS — I252 Old myocardial infarction: Secondary | ICD-10-CM | POA: Diagnosis not present

## 2021-04-04 NOTE — Progress Notes (Signed)
Daily Session Note  Patient Details  Name: Jose Edwards MRN: [id number] Date of Birth: 08/20/1955 Referring Provider:   Flowsheet Row Cardiac Rehab from 01/31/2021 in [facility] Cardiac and Pulmonary Rehab  Referring Provider Gwenlyn Found       Encounter Date: 04/04/2021  Check In:  Session Check In - 04/04/21 1708       Check-In   Supervising physician immediately available to respond to emergencies See telemetry face sheet for immediately available ER MD    Location [facility]-Cardiac & Pulmonary Rehab    Staff Present Renita Papa, RN BSN;Joseph Tessie Fass, RCP,RRT,BSRT;Kara [place], [place], ASCM CEP, Exercise Physiologist    Virtual Visit No    Medication changes reported     No    Fall or balance concerns reported    No    Warm-up and Cool-down Performed on first and last piece of equipment    Resistance Training Performed Yes    VAD Patient? No    PAD/SET Patient? No      Pain Assessment   Currently in Pain? No/denies                Social History   Tobacco Use  Smoking Status Never  Smokeless Tobacco Never    Goals Met:  Independence with exercise equipment Exercise tolerated well No report of concerns or symptoms today Strength training completed today  Goals Unmet:  Not Applicable  Comments: Pt able to follow exercise prescription today without complaint.  Will continue to monitor for progression.    Dr. Emily Filbert is Medical Director for [place].  Dr. Ottie Glazier is Medical Director for [facility] Pulmonary Rehabilitation.

## 2021-04-08 ENCOUNTER — Encounter: Payer: BC Managed Care – PPO | Admitting: *Deleted

## 2021-04-08 ENCOUNTER — Other Ambulatory Visit: Payer: Self-pay

## 2021-04-08 DIAGNOSIS — I252 Old myocardial infarction: Secondary | ICD-10-CM | POA: Diagnosis not present

## 2021-04-08 DIAGNOSIS — Z955 Presence of coronary angioplasty implant and graft: Secondary | ICD-10-CM | POA: Diagnosis not present

## 2021-04-08 DIAGNOSIS — I213 ST elevation (STEMI) myocardial infarction of unspecified site: Secondary | ICD-10-CM

## 2021-04-08 DIAGNOSIS — Z48812 Encounter for surgical aftercare following surgery on the circulatory system: Secondary | ICD-10-CM | POA: Diagnosis not present

## 2021-04-08 NOTE — Progress Notes (Signed)
Daily Session Note  Patient Details  Name: Jose Edwards MRN: [id number] Date of Birth: November 21, 1955 Referring Provider:   Flowsheet Row Cardiac Rehab from 01/31/2021 in [facility] Cardiac and Pulmonary Rehab  Referring Provider Gwenlyn Found       Encounter Date: 04/08/2021  Check In:  Session Check In - 04/08/21 1653       Check-In   Supervising physician immediately available to respond to emergencies See telemetry face sheet for immediately available ER MD    Location [facility]-Cardiac & Pulmonary Rehab    Staff Present Renita Papa, RN BSN;Joseph Tessie Fass, [place]    Virtual Visit No    Medication changes reported     No    Fall or balance concerns reported    No    Warm-up and Cool-down Performed on first and last piece of equipment    Resistance Training Performed Yes    VAD Patient? No    PAD/SET Patient? No      Pain Assessment   Currently in Pain? No/denies                Social History   Tobacco Use  Smoking Status Never  Smokeless Tobacco Never    Goals Met:  Independence with exercise equipment Exercise tolerated well No report of concerns or symptoms today Strength training completed today  Goals Unmet:  Not Applicable  Comments: Pt able to follow exercise prescription today without complaint.  Will continue to monitor for progression.    Dr. Emily Filbert is Medical Director for [place].  Dr. Ottie Glazier is Medical Director for [facility] Pulmonary Rehabilitation.

## 2021-04-10 ENCOUNTER — Other Ambulatory Visit: Payer: Self-pay

## 2021-04-10 VITALS — Ht 67.0 in | Wt 227.5 lb

## 2021-04-10 DIAGNOSIS — I213 ST elevation (STEMI) myocardial infarction of unspecified site: Secondary | ICD-10-CM

## 2021-04-10 DIAGNOSIS — Z955 Presence of coronary angioplasty implant and graft: Secondary | ICD-10-CM | POA: Diagnosis not present

## 2021-04-10 DIAGNOSIS — Z48812 Encounter for surgical aftercare following surgery on the circulatory system: Secondary | ICD-10-CM | POA: Diagnosis not present

## 2021-04-10 DIAGNOSIS — I252 Old myocardial infarction: Secondary | ICD-10-CM | POA: Diagnosis not present

## 2021-04-10 NOTE — Progress Notes (Signed)
Daily Session Note  Patient Details  Name: Jose Edwards MRN: [id number] Date of Birth: [birth year] Referring Provider:   Flowsheet Row Cardiac Rehab from 01/31/2021 in [facility] Cardiac and Pulmonary Rehab  Referring Provider Gwenlyn Found       Encounter Date: 04/10/2021  Check In:  Session Check In - 04/10/21 1631       Check-In   Supervising physician immediately available to respond to emergencies See telemetry face sheet for immediately available ER MD    Location [facility]-Cardiac & Pulmonary Rehab    Staff Present Birdie Sons, MPA, Nino Glow, MS, ASCM CEP, Exercise Physiologist    Virtual Visit No    Medication changes reported     No    Fall or balance concerns reported    No    Tobacco Cessation No Change    Warm-up and Cool-down Performed on first and last piece of equipment    Resistance Training Performed Yes    VAD Patient? No    PAD/SET Patient? No      Pain Assessment   Currently in Pain? No/denies                Social History   Tobacco Use  Smoking Status Never  Smokeless Tobacco Never    Goals Met:  Independence with exercise equipment Exercise tolerated well No report of concerns or symptoms today Strength training completed today  Goals Unmet:  Not Applicable  Comments: Pt able to follow exercise prescription today without complaint.  Will continue to monitor for progression.    Dr. Emily Filbert is Medical Director for [place].  Dr. Ottie Glazier is Medical Director for [facility] Pulmonary Rehabilitation.

## 2021-04-11 ENCOUNTER — Encounter: Payer: BC Managed Care – PPO | Admitting: *Deleted

## 2021-04-11 DIAGNOSIS — Z955 Presence of coronary angioplasty implant and graft: Secondary | ICD-10-CM

## 2021-04-11 DIAGNOSIS — I252 Old myocardial infarction: Secondary | ICD-10-CM | POA: Diagnosis not present

## 2021-04-11 DIAGNOSIS — Z48812 Encounter for surgical aftercare following surgery on the circulatory system: Secondary | ICD-10-CM | POA: Diagnosis not present

## 2021-04-11 DIAGNOSIS — I213 ST elevation (STEMI) myocardial infarction of unspecified site: Secondary | ICD-10-CM

## 2021-04-11 NOTE — Progress Notes (Signed)
Daily Session Note  Patient Details  Name: Jose Edwards MRN: [id number] Date of Birth: Apr 10, 1955 Referring Provider:   Flowsheet Row Cardiac Rehab from 01/31/2021 in [facility] Cardiac and Pulmonary Rehab  Referring Provider Gwenlyn Found       Encounter Date: 04/11/2021  Check In:  Session Check In - 04/11/21 1726       Check-In   Supervising physician immediately available to respond to emergencies See telemetry face sheet for immediately available ER MD    Location [facility]-Cardiac & Pulmonary Rehab    Staff Present Renita Papa, RN BSN;Joseph Tessie Fass, [place]    Virtual Visit No    Medication changes reported     No    Fall or balance concerns reported    No    Warm-up and Cool-down Performed on first and last piece of equipment    Resistance Training Performed Yes    VAD Patient? No    PAD/SET Patient? No      Pain Assessment   Currently in Pain? No/denies                Social History   Tobacco Use  Smoking Status Never  Smokeless Tobacco Never    Goals Met:  Independence with exercise equipment Exercise tolerated well No report of concerns or symptoms today Strength training completed today  Goals Unmet:  Not Applicable  Comments: Pt able to follow exercise prescription today without complaint.  Will continue to monitor for progression.    Dr. Emily Filbert is Medical Director for [place].  Dr. Ottie Glazier is Medical Director for [facility] Pulmonary Rehabilitation.

## 2021-04-17 ENCOUNTER — Other Ambulatory Visit: Payer: Self-pay

## 2021-04-17 ENCOUNTER — Encounter: Payer: BC Managed Care – PPO | Admitting: Internal Medicine

## 2021-04-17 ENCOUNTER — Encounter: Payer: BC Managed Care – PPO | Admitting: *Deleted

## 2021-04-17 DIAGNOSIS — I213 ST elevation (STEMI) myocardial infarction of unspecified site: Secondary | ICD-10-CM

## 2021-04-17 DIAGNOSIS — I252 Old myocardial infarction: Secondary | ICD-10-CM | POA: Diagnosis not present

## 2021-04-17 DIAGNOSIS — Z955 Presence of coronary angioplasty implant and graft: Secondary | ICD-10-CM

## 2021-04-17 DIAGNOSIS — Z48812 Encounter for surgical aftercare following surgery on the circulatory system: Secondary | ICD-10-CM | POA: Diagnosis not present

## 2021-04-17 NOTE — Progress Notes (Signed)
Daily Session Note  Patient Details  Name: Jose Edwards MRN: [id number] Date of Birth: [birth year] Referring Provider:   Flowsheet Row Cardiac Rehab from 01/31/2021 in [facility] Cardiac and Pulmonary Rehab  Referring Provider Gwenlyn Found       Encounter Date: 04/17/2021  Check In:  Session Check In - 04/17/21 1710       Check-In   Supervising physician immediately available to respond to emergencies See telemetry face sheet for immediately available ER MD    Location [facility]-Cardiac & Pulmonary Rehab    Staff Present Renita Papa, RN BSN;Joseph Tessie Fass, RCP,RRT,BSRT;Kara [place], [place], ASCM CEP, Exercise Physiologist    Virtual Visit No    Medication changes reported     No    Fall or balance concerns reported    No    Warm-up and Cool-down Performed on first and last piece of equipment    Resistance Training Performed Yes    VAD Patient? No    PAD/SET Patient? No      Pain Assessment   Currently in Pain? No/denies                Social History   Tobacco Use  Smoking Status Never  Smokeless Tobacco Never    Goals Met:  Independence with exercise equipment Exercise tolerated well No report of concerns or symptoms today Strength training completed today  Goals Unmet:  Not Applicable  Comments: Pt able to follow exercise prescription today without complaint.  Will continue to monitor for progression.    Dr. Emily Filbert is Medical Director for [place].  Dr. Ottie Glazier is Medical Director for [facility] Pulmonary Rehabilitation.

## 2021-04-18 ENCOUNTER — Encounter: Payer: BC Managed Care – PPO | Admitting: *Deleted

## 2021-04-18 DIAGNOSIS — Z955 Presence of coronary angioplasty implant and graft: Secondary | ICD-10-CM

## 2021-04-18 DIAGNOSIS — I213 ST elevation (STEMI) myocardial infarction of unspecified site: Secondary | ICD-10-CM

## 2021-04-18 DIAGNOSIS — I252 Old myocardial infarction: Secondary | ICD-10-CM | POA: Diagnosis not present

## 2021-04-18 DIAGNOSIS — Z48812 Encounter for surgical aftercare following surgery on the circulatory system: Secondary | ICD-10-CM | POA: Diagnosis not present

## 2021-04-18 NOTE — Progress Notes (Signed)
Daily Session Note ° °Patient Details  °Name: Jose Edwards °MRN: [id number] °Date of Birth: 08/31/1955 °Referring Provider:   °Flowsheet Row Cardiac Rehab from 01/31/2021 in [facility] Cardiac and Pulmonary Rehab  °Referring Provider [place]  ° °  ° ° °Encounter Date: 04/18/2021 ° °Check In: ° Session Check In - 04/18/21 1701   ° °  ° Check-In  ° Supervising physician immediately available to respond to emergencies See telemetry face sheet for immediately available ER MD   ° Location [facility]-Cardiac & Pulmonary Rehab   ° Staff Present Meredith Craven, RN BSN;Joseph Hood, RCP,RRT,BSRT   ° Virtual Visit No   ° Medication changes reported     No   ° Fall or balance concerns reported    No   ° Warm-up and Cool-down Performed on first and last piece of equipment   ° Resistance Training Performed Yes   ° VAD Patient? No   ° PAD/SET Patient? No   °  ° Pain Assessment  ° Currently in Pain? No/denies   ° °  °  ° °  ° ° ° ° ° °Social History  ° °Tobacco Use  °Smoking Status Never  °Smokeless Tobacco Never  ° ° °Goals Met:  °Independence with exercise equipment °Exercise tolerated well °No report of concerns or symptoms today °Strength training completed today ° °Goals Unmet:  °Not Applicable ° °Comments: Pt able to follow exercise prescription today without complaint.  Will continue to monitor for progression. ° ° ° °Dr. Mark Miller is Medical Director for [facility] Cardiac Rehabilitation.  °Dr. Fuad Aleskerov is Medical Director for [facility] Pulmonary Rehabilitation. °

## 2021-04-18 NOTE — Patient Instructions (Signed)
Discharge Patient Instructions  Patient Details  Name: Jose Edwards MRN: [id number] Date of Birth: Nov 16, 1955 Referring Provider:  Lorretta Harp, MD   Number of Visits: 36  Reason for Discharge:  Patient reached a stable level of exercise. Patient independent in their exercise. Patient has met program and personal goals.  Smoking History:  Social History   Tobacco Use  Smoking Status Never  Smokeless Tobacco Never    Diagnosis:  ST elevation myocardial infarction (STEMI), unspecified artery (HCC)  Status post coronary artery stent placement  Initial Exercise Prescription:  Initial Exercise Prescription - 01/31/21 1600       Date of Initial Exercise RX and Referring Provider   Date 01/31/21    Referring Provider Gwenlyn Found      Oxygen   Maintain Oxygen Saturation 88% or higher      Treadmill   MPH 2.8    Grade 1    Minutes 15    METs 3.6      Recumbant Bike   Level 3    RPM 60    Watts 51    Minutes 15    METs 3.6      NuStep   Level 3    SPM 80    Minutes 15    METs 3.6      REL-XR   Level 3    Speed 50    Minutes 15    METs 3.6      Prescription Details   Frequency (times per week) 3    Duration Progress to 30 minutes of continuous aerobic without signs/symptoms of physical distress      Intensity   THRR 40-80% of Max Heartrate 110-139    Ratings of Perceived Exertion 11-15    Perceived Dyspnea 0-4      Resistance Training   Training Prescription Yes    Weight 4 lb    Reps 10-15             Discharge Exercise Prescription (Final Exercise Prescription Changes):  Exercise Prescription Changes - 04/09/21 1300       Response to Exercise   Blood Pressure (Admit) 110/58    Blood Pressure (Exit) 108/60    Heart Rate (Admit) 82 bpm    Heart Rate (Exercise) 100 bpm    Heart Rate (Exit) 85 bpm    Rating of Perceived Exertion (Exercise) 12    Symptoms none    Duration Continue with 30 min of aerobic exercise without  signs/symptoms of physical distress.    Intensity THRR unchanged      Progression   Progression Continue to progress workloads to maintain intensity without signs/symptoms of physical distress.    Average METs 4.52      Resistance Training   Training Prescription Yes    Weight 8 lb    Reps 10-15      Interval Training   Interval Training No      Treadmill   MPH 3.2    Grade 4.5    Minutes 15    METs 5.44      Recumbant Bike   Level 5    Watts 51    Minutes 15    METs 3.7      Home Exercise Plan   Plans to continue exercise at [employer] (comment)   gym at work   Frequency Add 2 additional days to program exercise sessions.   start with 1   Initial Home Exercises Provided 02/25/21  Functional Capacity:  6 Minute Walk     Row Name 01/31/21 1627 04/10/21 1742       6 Minute Walk   Phase Initial Initial    Distance 1485 feet 1655 feet    Distance % Change -- 11.4 %    Distance Feet Change -- 170 ft    Walk Time 6 minutes 6 minutes    # of Rest Breaks 0 0    MPH 2.8 3.13    METS 3.6 3.51    RPE 11 12    Perceived Dyspnea  1 0    VO2 Peak 12.47 12.31    Symptoms No No    Resting HR 80 bpm 73 bpm    Resting BP 118/60 128/70    Resting Oxygen Saturation  97 % 96 %    Exercise Oxygen Saturation  during 6 min walk 97 % 96 %    Max Ex. HR 110 bpm 106 bpm    Max Ex. BP 190/76 156/70    2 Minute Post BP 108/54 --              Nutrition & Weight - Outcomes:  Pre Biometrics - 01/31/21 1635       Pre Biometrics   Height _0  (1.702 m)    Weight 225 lb 6.4 oz (102.2 kg)    BMI (Calculated) 35.29    Single Leg Stand 20 seconds             Post Biometrics - 04/10/21 1743        Post  Biometrics   Height _1  (1.702 m)    Weight 227 lb 8 oz (103.2 kg)    BMI (Calculated) 35.62             Nutrition:  Nutrition Therapy & Goals - 03/11/21 1708       Nutrition Therapy   Diet Heart healthy, low Na    Drug/Food  Interactions Statins/Certain Fruits    Protein (specify units) 80g    Fiber 30 grams    Whole Grain Foods 3 servings    Saturated Fats 12 max. grams    Fruits and Vegetables 8 servings/day    Sodium 1.5 grams      Personal Nutrition Goals   Nutrition Goal ST: montior added salt to processed food and cooking, honor hunger - when not eating lunch, make sure to have a satiating snack with protein, fat, and/or fiber (ex: vegetables with hummus, fruit with yogurt) LT: limit Na <1.5g, avoid hunger at night    Comments PYP: 63. He doesn't eat fried food most of the time, most of the time it is baked or grilled. He declines french fries when he goes out to eat. He will get vegetables when going out to eat. He goes out to eat 2x/week at work. He tried to cut down on portion sizes and snacks. He does not eat right away, will have a nutrition bar for breakfast L: may or may not have lunch - most of the time it is leftover D: spaghetti, or grilled food, with vegetables (right now he loves roasted tomatoes, but he reports getting a good variety of vegetables), potatoes a lot. grilled chicken, sometimes steak, sometimes salmon or shrimp. His wife will use only some salt during cooking, he uses pepper on most foods. He doesn't know if she uses canola or olive oil. He reports getting very hungry at night when he doesn't eat lunch. He likes sweet pickled,  but he cut some out and switched to dill pickles, mentioned the main concern there would be salt content. Discussed sodium intake and honoring hunger as well as heart healthy eating.      Intervention Plan   Intervention Prescribe, educate and counsel regarding individualized specific dietary modifications aiming towards targeted core components such as weight, hypertension, lipid management, diabetes, heart failure and other comorbidities.;Nutrition handout(s) given to patient.    Expected Outcomes Short Term Goal: Understand basic principles of dietary content, such  as calories, fat, sodium, cholesterol and nutrients.;Short Term Goal: A plan has been developed with personal nutrition goals set during dietitian appointment.;Long Term Goal: Adherence to prescribed nutrition plan.              Goals reviewed with patient; copy given to patient.

## 2021-04-22 ENCOUNTER — Other Ambulatory Visit: Payer: Self-pay

## 2021-04-22 ENCOUNTER — Encounter: Payer: BC Managed Care – PPO | Admitting: *Deleted

## 2021-04-22 DIAGNOSIS — Z955 Presence of coronary angioplasty implant and graft: Secondary | ICD-10-CM

## 2021-04-22 DIAGNOSIS — I252 Old myocardial infarction: Secondary | ICD-10-CM | POA: Diagnosis not present

## 2021-04-22 DIAGNOSIS — I213 ST elevation (STEMI) myocardial infarction of unspecified site: Secondary | ICD-10-CM

## 2021-04-22 DIAGNOSIS — Z48812 Encounter for surgical aftercare following surgery on the circulatory system: Secondary | ICD-10-CM | POA: Diagnosis not present

## 2021-04-22 NOTE — Patient Instructions (Signed)
Discharge Patient Instructions  Patient Details  Name: Jose Edwards MRN: [id number] Date of Birth: May 14, 1955 Referring Provider:  Lorretta Harp, MD   Number of Visits: 36  Reason for Discharge:  Patient reached a stable level of exercise. Patient independent in their exercise. Patient has met program and personal goals.  Smoking History:  Social History   Tobacco Use  Smoking Status Never  Smokeless Tobacco Never    Diagnosis:  ST elevation myocardial infarction (STEMI), unspecified artery (HCC)  Status post coronary artery stent placement  Initial Exercise Prescription:  Initial Exercise Prescription - 01/31/21 1600       Date of Initial Exercise RX and Referring Provider   Date 01/31/21    Referring Provider Gwenlyn Found      Oxygen   Maintain Oxygen Saturation 88% or higher      Treadmill   MPH 2.8    Grade 1    Minutes 15    METs 3.6      Recumbant Bike   Level 3    RPM 60    Watts 51    Minutes 15    METs 3.6      NuStep   Level 3    SPM 80    Minutes 15    METs 3.6      REL-XR   Level 3    Speed 50    Minutes 15    METs 3.6      Prescription Details   Frequency (times per week) 3    Duration Progress to 30 minutes of continuous aerobic without signs/symptoms of physical distress      Intensity   THRR 40-80% of Max Heartrate 110-139    Ratings of Perceived Exertion 11-15    Perceived Dyspnea 0-4      Resistance Training   Training Prescription Yes    Weight 4 lb    Reps 10-15             Discharge Exercise Prescription (Final Exercise Prescription Changes):  Exercise Prescription Changes - 04/22/21 1600       Response to Exercise   Blood Pressure (Admit) 130/76    Blood Pressure (Exit) 120/60    Heart Rate (Admit) 81 bpm    Heart Rate (Exercise) 91 bpm    Heart Rate (Exit) 84 bpm    Oxygen Saturation (Admit) 94 %    Oxygen Saturation (Exercise) 96 %    Oxygen Saturation (Exit) 96 %    Rating of Perceived  Exertion (Exercise) 12    Symptoms none    Duration Continue with 30 min of aerobic exercise without signs/symptoms of physical distress.    Intensity THRR unchanged      Progression   Progression Continue to progress workloads to maintain intensity without signs/symptoms of physical distress.    Average METs 5.28      Resistance Training   Training Prescription Yes    Weight 8 lb    Reps 10-15      Interval Training   Interval Training No      Treadmill   MPH 3    Grade 4    Minutes 15    METs 5.16      Recumbant Bike   Level 5    Minutes 15      REL-XR   Level 6    Minutes 15    METs 5.4      Home Exercise Plan   Plans to continue exercise at [facility] (comment)   gym at work   Frequency Add 2 additional days to program exercise sessions.   start with 1   Initial Home Exercises Provided 02/25/21      Oxygen   Maintain Oxygen Saturation 88% or higher             Functional Capacity:  6 Minute Walk     Row Name 01/31/21 1627 04/10/21 1742       6 Minute Walk   Phase Initial Initial    Distance 1485 feet 1655 feet    Distance % Change -- 11.4 %    Distance Feet Change -- 170 ft    Walk Time 6 minutes 6 minutes    # of Rest Breaks 0 0    MPH 2.8 3.13    METS 3.6 3.51    RPE 11 12    Perceived Dyspnea  1 0    VO2 Peak 12.47 12.31    Symptoms No No    Resting HR 80 bpm 73 bpm    Resting BP 118/60 128/70    Resting Oxygen Saturation  97 % 96 %    Exercise Oxygen Saturation  during 6 min walk 97 % 96 %    Max Ex. HR 110 bpm 106 bpm    Max Ex. BP 190/76 156/70    2 Minute Post BP 108/54 --             Nutrition & Weight - Outcomes:  Pre Biometrics - 01/31/21 1635       Pre Biometrics   Height $Remov'5\' 7"'bszvvM$  (1.702 m)    Weight 225 lb 6.4 oz (102.2 kg)    BMI (Calculated) 35.29    Single Leg Stand 20 seconds             Post Biometrics - 04/10/21 1743        Post  Biometrics   Height $Remov'5\' 7"'xWNaOo$  (1.702 m)    Weight 227 lb 8 oz  (103.2 kg)    BMI (Calculated) 35.62             Nutrition:  Nutrition Therapy & Goals - 03/11/21 1708       Nutrition Therapy   Diet Heart healthy, low Na    Drug/Food Interactions Statins/Certain Fruits    Protein (specify units) 80g    Fiber 30 grams    Whole Grain Foods 3 servings    Saturated Fats 12 max. grams    Fruits and Vegetables 8 servings/day    Sodium 1.5 grams      Personal Nutrition Goals   Nutrition Goal ST: montior added salt to processed food and cooking, honor hunger - when not eating lunch, make sure to have a satiating snack with protein, fat, and/or fiber (ex: vegetables with hummus, fruit with yogurt) LT: limit Na <1.5g, avoid hunger at night    Comments PYP: 63. He doesn't eat fried food most of the time, most of the time it is baked or grilled. He declines french fries when he goes out to eat. He will get vegetables when going out to eat. He goes out to eat 2x/week at work. He tried to cut down on portion sizes and snacks. He does not eat right away, will have a nutrition bar for breakfast L: may or may not have lunch - most of the time it is leftover D: spaghetti, or grilled food, with vegetables (right now he loves roasted tomatoes, but he reports getting a good  variety of vegetables), potatoes a lot. grilled chicken, sometimes steak, sometimes salmon or shrimp. His wife will use only some salt during cooking, he uses pepper on most foods. He doesn't know if she uses canola or olive oil. He reports getting very hungry at night when he doesn't eat lunch. He likes sweet pickled, but he cut some out and switched to dill pickles, mentioned the main concern there would be salt content. Discussed sodium intake and honoring hunger as well as heart healthy eating.      Intervention Plan   Intervention Prescribe, educate and counsel regarding individualized specific dietary modifications aiming towards targeted core components such as weight, hypertension, lipid  management, diabetes, heart failure and other comorbidities.;Nutrition handout(s) given to patient.    Expected Outcomes Short Term Goal: Understand basic principles of dietary content, such as calories, fat, sodium, cholesterol and nutrients.;Short Term Goal: A plan has been developed with personal nutrition goals set during dietitian appointment.;Long Term Goal: Adherence to prescribed nutrition plan.             Goals reviewed with patient; copy given to patient.

## 2021-04-22 NOTE — Progress Notes (Signed)
Daily Session Note  Patient Details  Name: Jose Edwards MRN: [id number] Date of Birth: 1956/03/29 Referring Provider:   Flowsheet Row Cardiac Rehab from 01/31/2021 in [facility] Cardiac and Pulmonary Rehab  Referring Provider Gwenlyn Found       Encounter Date: 04/22/2021  Check In:  Session Check In - 04/22/21 1701       Check-In   Supervising physician immediately available to respond to emergencies See telemetry face sheet for immediately available ER MD    Location [facility]-Cardiac & Pulmonary Rehab    Staff Present Renita Papa, RN BSN;Joseph Tessie Fass, RCP,RRT,BSRT;Kara Callao, [place], ASCM CEP, Exercise Physiologist    Virtual Visit No    Medication changes reported     No    Fall or balance concerns reported    No    Warm-up and Cool-down Performed on first and last piece of equipment    Resistance Training Performed Yes    VAD Patient? No    PAD/SET Patient? No      Pain Assessment   Currently in Pain? No/denies                Social History   Tobacco Use  Smoking Status Never  Smokeless Tobacco Never    Goals Met:  Independence with exercise equipment Exercise tolerated well No report of concerns or symptoms today Strength training completed today  Goals Unmet:  Not Applicable  Comments: Pt able to follow exercise prescription today without complaint.  Will continue to monitor for progression.    Dr. Emily Filbert is Medical Director for [facility].  Dr. Ottie Glazier is Medical Director for [facility] Pulmonary Rehabilitation.

## 2021-04-24 ENCOUNTER — Encounter: Payer: Self-pay | Admitting: *Deleted

## 2021-04-24 ENCOUNTER — Other Ambulatory Visit: Payer: Self-pay

## 2021-04-24 DIAGNOSIS — I252 Old myocardial infarction: Secondary | ICD-10-CM | POA: Diagnosis not present

## 2021-04-24 DIAGNOSIS — Z955 Presence of coronary angioplasty implant and graft: Secondary | ICD-10-CM

## 2021-04-24 DIAGNOSIS — Z48812 Encounter for surgical aftercare following surgery on the circulatory system: Secondary | ICD-10-CM | POA: Diagnosis not present

## 2021-04-24 DIAGNOSIS — I213 ST elevation (STEMI) myocardial infarction of unspecified site: Secondary | ICD-10-CM

## 2021-04-24 NOTE — Progress Notes (Signed)
Discharge Progress Report  Patient Details  Name: Jose Edwards MRN: [id number] Date of Birth: Nov 01, 1955 Referring Provider:   Flowsheet Row Cardiac Rehab from 01/31/2021 in [facility] Cardiac and Pulmonary Rehab  Referring Provider Gwenlyn Found        Number of Visits: 53  Reason for Discharge:  Patient reached a stable level of exercise. Patient independent in their exercise. Patient has met program and personal goals.  Smoking History:  Social History   Tobacco Use  Smoking Status Never  Smokeless Tobacco Never    Diagnosis:  ST elevation myocardial infarction (STEMI), unspecified artery (HCC)  Status post coronary artery stent placement  ADL [facility]:   Initial Exercise Prescription:  Initial Exercise Prescription - 01/31/21 1600       Date of Initial Exercise RX and Referring Provider   Date 01/31/21    Referring Provider Gwenlyn Found      Oxygen   Maintain Oxygen Saturation 88% or higher      Treadmill   MPH 2.8    Grade 1    Minutes 15    METs 3.6      Recumbant Bike   Level 3    RPM 60    Watts 51    Minutes 15    METs 3.6      NuStep   Level 3    SPM 80    Minutes 15    METs 3.6      REL-XR   Level 3    Speed 50    Minutes 15    METs 3.6      Prescription Details   Frequency (times per week) 3    Duration Progress to 30 minutes of continuous aerobic without signs/symptoms of physical distress      Intensity   THRR 40-80% of Max Heartrate 110-139    Ratings of Perceived Exertion 11-15    Perceived Dyspnea 0-4      Resistance Training   Training Prescription Yes    Weight 4 lb    Reps 10-15             Discharge Exercise Prescription (Final Exercise Prescription Changes):  Exercise Prescription Changes - 04/22/21 1600       Response to Exercise   Blood Pressure (Admit) 130/76    Blood Pressure (Exit) 120/60    Heart Rate (Admit) 81 bpm    Heart Rate (Exercise) 91 bpm    Heart Rate (Exit) 84 bpm    Oxygen Saturation (Admit) 94  %    Oxygen Saturation (Exercise) 96 %    Oxygen Saturation (Exit) 96 %    Rating of Perceived Exertion (Exercise) 12    Symptoms none    Duration Continue with 30 min of aerobic exercise without signs/symptoms of physical distress.    Intensity THRR unchanged      Progression   Progression Continue to progress workloads to maintain intensity without signs/symptoms of physical distress.    Average METs 5.28      Resistance Training   Training Prescription Yes    Weight 8 lb    Reps 10-15      Interval Training   Interval Training No      Treadmill   MPH 3    Grade 4    Minutes 15    METs 5.16      Recumbant Bike   Level 5    Minutes 15      REL-XR   Level 6    Minutes  15    METs 5.4      Home Exercise Plan   Plans to continue exercise at [employer] (comment)   gym at work   Frequency Add 2 additional days to program exercise sessions.   start with 1   Initial Home Exercises Provided 02/25/21      Oxygen   Maintain Oxygen Saturation 88% or higher             Functional Capacity:  6 Minute Walk     Row Name 01/31/21 1627 04/10/21 1742       6 Minute Walk   Phase Initial Initial    Distance 1485 feet 1655 feet    Distance % Change -- 11.4 %    Distance Feet Change -- 170 ft    Walk Time 6 minutes 6 minutes    # of Rest Breaks 0 0    MPH 2.8 3.13    METS 3.6 3.51    RPE 11 12    Perceived Dyspnea  1 0    VO2 Peak 12.47 12.31    Symptoms No No    Resting HR 80 bpm 73 bpm    Resting BP 118/60 128/70    Resting Oxygen Saturation  97 % 96 %    Exercise Oxygen Saturation  during 6 min walk 97 % 96 %    Max Ex. HR 110 bpm 106 bpm    Max Ex. BP 190/76 156/70    2 Minute Post BP 108/54 --             Psychological, QOL, Others - Outcomes: PHQ 2/9: Depression screen [facility] 2/9 04/18/2021 01/31/2021  Decreased Interest 0 0  Down, Depressed, Hopeless 0 0  PHQ - 2 Score 0 0  Altered sleeping 1 1  Tired, decreased energy 1 0  Change in  appetite 1 0  Feeling bad or failure about yourself  0 0  Trouble concentrating 0 0  Moving slowly or fidgety/restless 0 0  Suicidal thoughts 0 0  PHQ-9 Score 3 1  Difficult doing work/chores Not difficult at all Not difficult at all    Quality of Life:  Quality of Life - 04/18/21 1709       Quality of Life   Select Quality of Life      Quality of Life Scores   Health/Function Pre 21.2 %    Health/Function Post 25.43 %    Health/Function % Change 19.95 %    Socioeconomic Post 24 %    Psych/Spiritual Pre 22.86 %    Psych/Spiritual Post 26.57 %    Psych/Spiritual % Change 16.23 %    Family Pre 25.2 %    Family Post 28.8 %    Family % Change 14.29 %    GLOBAL Pre 22.79 %    GLOBAL Post 25.88 %    GLOBAL % Change 13.56 %              Nutrition & Weight - Outcomes:  Pre Biometrics - 01/31/21 1635       Pre Biometrics   Height _0  (1.702 m)    Weight 225 lb 6.4 oz (102.2 kg)    BMI (Calculated) 35.29    Single Leg Stand 20 seconds             Post Biometrics - 04/10/21 1743        Post  Biometrics   Height _1  (1.702 m)    Weight 227 lb 8  oz (103.2 kg)    BMI (Calculated) 35.62             Nutrition:  Nutrition Therapy & Goals - 03/11/21 1708       Nutrition Therapy   Diet Heart healthy, low Na    Drug/Food Interactions Statins/Certain Fruits    Protein (specify units) 80g    Fiber 30 grams    Whole Grain Foods 3 servings    Saturated Fats 12 max. grams    Fruits and Vegetables 8 servings/day    Sodium 1.5 grams      Personal Nutrition Goals   Nutrition Goal ST: montior added salt to processed food and cooking, honor hunger - when not eating lunch, make sure to have a satiating snack with protein, fat, and/or fiber (ex: vegetables with hummus, fruit with yogurt) LT: limit Na <1.5g, avoid hunger at night    Comments PYP: 63. He doesn't eat fried food most of the time, most of the time it is baked or grilled. He declines french fries when  he goes out to eat. He will get vegetables when going out to eat. He goes out to eat 2x/week at work. He tried to cut down on portion sizes and snacks. He does not eat right away, will have a nutrition bar for breakfast L: may or may not have lunch - most of the time it is leftover D: spaghetti, or grilled food, with vegetables (right now he loves roasted tomatoes, but he reports getting a good variety of vegetables), potatoes a lot. grilled chicken, sometimes steak, sometimes salmon or shrimp. His wife will use only some salt during cooking, he uses pepper on most foods. He doesn't know if she uses canola or olive oil. He reports getting very hungry at night when he doesn't eat lunch. He likes sweet pickled, but he cut some out and switched to dill pickles, mentioned the main concern there would be salt content. Discussed sodium intake and honoring hunger as well as heart healthy eating.      Intervention Plan   Intervention Prescribe, educate and counsel regarding individualized specific dietary modifications aiming towards targeted core components such as weight, hypertension, lipid management, diabetes, heart failure and other comorbidities.;Nutrition handout(s) given to patient.    Expected Outcomes Short Term Goal: Understand basic principles of dietary content, such as calories, fat, sodium, cholesterol and nutrients.;Short Term Goal: A plan has been developed with personal nutrition goals set during dietitian appointment.;Long Term Goal: Adherence to prescribed nutrition plan.             Nutrition Discharge:   Education Questionnaire Score:  Knowledge Questionnaire Score - 04/18/21 1709       Knowledge Questionnaire Score   Post Score 22/26             Goals reviewed with patient; copy given to patient.

## 2021-04-24 NOTE — Progress Notes (Signed)
Daily Session Note  Patient Details  Name: Jose Edwards MRN: [id number] Date of Birth: 04/05/1955 Referring Provider:   Flowsheet Row Cardiac Rehab from 01/31/2021 in [facility] Cardiac and Pulmonary Rehab  Referring Provider Gwenlyn Found       Encounter Date: 04/24/2021  Check In:  Session Check In - 04/24/21 1627       Check-In   Supervising physician immediately available to respond to emergencies See telemetry face sheet for immediately available ER MD    Location [facility]-Cardiac & Pulmonary Rehab    Staff Present Birdie Sons, MPA, RN;Joseph Alcus Dad, RN BSN    Virtual Visit No    Medication changes reported     No    Fall or balance concerns reported    No    Tobacco Cessation No Change    Warm-up and Cool-down Performed on first and last piece of equipment    Resistance Training Performed Yes    VAD Patient? No    PAD/SET Patient? No      Pain Assessment   Currently in Pain? No/denies                Social History   Tobacco Use  Smoking Status Never  Smokeless Tobacco Never    Goals Met:  Independence with exercise equipment Exercise tolerated well No report of concerns or symptoms today Strength training completed today  Goals Unmet:  Not Applicable  Comments:  Jose Edwards graduated today from  rehab with 36 sessions completed.  Details of the patient's exercise prescription and what He needs to do in order to continue the prescription and progress were discussed with patient.  Patient was given a copy of prescription and goals.  Patient verbalized understanding.  Jose Edwards plans to continue to exercise by going to the gym at work.     Dr. Emily Filbert is Medical Director for [place].  Dr. Ottie Glazier is Medical Director for [facility] Pulmonary Rehabilitation.

## 2021-04-24 NOTE — Progress Notes (Signed)
Cardiac Individual Treatment Plan  Patient Details  Name: Jose Edwards MRN: [id number] Date of Birth: Jan 14, 1956 Referring Provider:   Flowsheet Row Cardiac Rehab from 01/31/2021 in [facility] Cardiac and Pulmonary Rehab  Referring Provider Gwenlyn Found       Initial Encounter Date:  Flowsheet Row Cardiac Rehab from 01/31/2021 in [facility] Cardiac and Pulmonary Rehab  Date 01/31/21       Visit Diagnosis: ST elevation myocardial infarction (STEMI), unspecified artery ([place])  Status post coronary artery stent placement  Patient's Home Medications on Admission:  Current Outpatient Medications:    aspirin 81 MG EC tablet, Take 1 tablet (81 mg total) by mouth daily. Swallow whole., Disp: 90 tablet, Rfl: 3   atorvastatin (LIPITOR) 80 MG tablet, Take 1 tablet (80 mg total) by mouth daily., Disp: 90 tablet, Rfl: 3   benzonatate (TESSALON PERLES) 100 MG capsule, 1-2 capsules every  8 hours as needed for cough, Disp: 30 capsule, Rfl: 0   Cholecalciferol (VITAMIN D3) 50 MCG (2000 UT) TABS, Take 2,000 Units by mouth 2 (two) times a week., Disp: , Rfl:    clopidogrel (PLAVIX) 75 MG tablet, Take 8 tablets (600 mg total) by mouth daily for 1 day, THEN 1 tablet (75 mg total) daily., Disp: 98 tablet, Rfl: 3   dapagliflozin propanediol (FARXIGA) 10 MG TABS tablet, Take 1 tablet (10 mg total) by mouth daily before breakfast., Disp: 30 tablet, Rfl: 11   fluticasone (FLONASE) 50 MCG/ACT nasal spray, Place 2 sprays into both nostrils daily., Disp: 16 g, Rfl: 2   losartan (COZAAR) 50 MG tablet, Take 1 tablet (50 mg total) by mouth daily., Disp: 90 tablet, Rfl: 3   metoprolol succinate (TOPROL-XL) 50 MG 24 hr tablet, Take 1 tablet (50 mg total) by mouth daily. Take with or immediately following a meal., Disp: 30 tablet, Rfl: 11   nitroGLYCERIN (NITROSTAT) 0.4 MG SL tablet, Place 1 tablet (0.4 mg total) under the tongue every 5 (five) minutes as needed for chest pain., Disp: 25 tablet, Rfl: 1   omeprazole (PRILOSEC  OTC) 20 MG tablet, Take 20 mg by mouth daily as needed (for heartburn)., Disp: , Rfl:    vitamin B-12 (CYANOCOBALAMIN) 100 MCG tablet, Take 100 mcg by mouth every other day. , Disp: , Rfl:    vitamin C (ASCORBIC ACID) 500 MG tablet, Take 500-1,000 mg by mouth daily., Disp: , Rfl:   Past Medical History: Past Medical History:  Diagnosis Date   Acute ST elevation myocardial infarction (STEMI) of anterolateral wall ([place]) 11/26/2020   100% D1 - DES PCI 2.25 x 16 (2.4 mm)   Benign positional vertigo    CAD S/P DES PCI D1  11/27/2020   100% D1 (DES PCI with Onyx Frontier DES 2.25 x 18 - deployed to 2.4 mm)   Coronary artery disease involving native coronary artery of native heart with angina pectoris ([place]) 11/26/2020   8/29 ([place] STEMI): Cath => Culprit lesion-100% D1 (DES PCI with Onyx Frontier DES 2.25 x 18 - deployed to 2.4 mm). Mid LM 30%, Ost-prox LAD 30% & RI 30%.     ED (erectile dysfunction)    GERD (gastroesophageal reflux disease)    Osteoarthrosis, unspecified whether generalized or localized, unspecified site     Tobacco Use: Social History   Tobacco Use  Smoking Status Never  Smokeless Tobacco Never    Labs: Recent Review Flowsheet Data     Labs for ITP Cardiac and Pulmonary Rehab Latest Ref Rng & Units 02/27/2010 06/21/2013 05/21/2016  11/26/2020 01/09/2021   Cholestrol 100 - 199 mg/dL 221(H) 205(A) 200 198 124   LDLCALC 0 - 99 mg/dL - 141 143(H) 134(H) 70   LDLDIRECT mg/dL 155.1 - - - -   HDL >39 mg/dL 39.20 - 39.40 38(L) 41   Trlycerides 0 - 149 mg/dL 158.0(H) - 88.0 132 59   Hemoglobin A1c 4.8 - 5.6 % - - - 5.6 -        Exercise Target Goals: Exercise Program Goal: Individual exercise prescription set using results from initial 6 min walk test and THRR while considering  patients activity barriers and safety.   Exercise Prescription Goal: Initial exercise prescription builds to 30-45 minutes a day of aerobic activity, 2-3 days per week.  Home exercise guidelines  will be given to patient during program as part of exercise prescription that the participant will acknowledge.   Education: Aerobic Exercise: - Group verbal and visual presentation on the components of exercise prescription. Introduces F.I.T.T principle from ACSM for exercise prescriptions.  Reviews F.I.T.T. principles of aerobic exercise including progression. Written material given at graduation.   Education: Resistance Exercise: - Group verbal and visual presentation on the components of exercise prescription. Introduces F.I.T.T principle from ACSM for exercise prescriptions  Reviews F.I.T.T. principles of resistance exercise including progression. Written material given at graduation. Flowsheet Row Cardiac Rehab from 04/17/2021 in [facility] Cardiac and Pulmonary Rehab  Date 04/03/21  Educator AS  Instruction Review Code 1- Verbalizes Understanding        Education: Exercise & Equipment Safety: - Individual verbal instruction and demonstration of equipment use and safety with use of the equipment. Flowsheet Row Cardiac Rehab from 04/17/2021 in [facility] Cardiac and Pulmonary Rehab  Date 01/31/21  Educator AS  Instruction Review Code 1- Verbalizes Understanding       Education: Exercise Physiology & General Exercise Guidelines: - Group verbal and written instruction with models to review the exercise physiology of the cardiovascular system and associated critical values. Provides general exercise guidelines with specific guidelines to those with heart or lung disease.    Education: Flexibility, Balance, Mind/Body Relaxation: - Group verbal and visual presentation with interactive activity on the components of exercise prescription. Introduces F.I.T.T principle from ACSM for exercise prescriptions. Reviews F.I.T.T. principles of flexibility and balance exercise training including progression. Also discusses the mind body connection.  Reviews various relaxation techniques to help reduce and  manage stress (i.e. Deep breathing, progressive muscle relaxation, and visualization). Balance handout provided to take home. Written material given at graduation. Flowsheet Row Cardiac Rehab from 04/17/2021 in [facility] Cardiac and Pulmonary Rehab  Date 04/10/21  Educator AS  Instruction Review Code 1- Verbalizes Understanding       Activity Barriers & Risk Stratification:  Activity Barriers & Cardiac Risk Stratification - 01/16/21 1416       Activity Barriers & Cardiac Risk Stratification   Activity Barriers Other (comment)    Comments left ankle has screws    Cardiac Risk Stratification Moderate             6 Minute Walk:  6 Minute Walk     Row Name 01/31/21 1627 04/10/21 1742       6 Minute Walk   Phase Initial Initial    Distance 1485 feet 1655 feet    Distance % Change -- 11.4 %    Distance Feet Change -- 170 ft    Walk Time 6 minutes 6 minutes    # of Rest Breaks 0 0  MPH 2.8 3.13    METS 3.6 3.51    RPE 11 12    Perceived Dyspnea  1 0    VO2 Peak 12.47 12.31    Symptoms No No    Resting HR 80 bpm 73 bpm    Resting BP 118/60 128/70    Resting Oxygen Saturation  97 % 96 %    Exercise Oxygen Saturation  during 6 min walk 97 % 96 %    Max Ex. HR 110 bpm 106 bpm    Max Ex. BP 190/76 156/70    2 Minute Post BP 108/54 --             Oxygen Initial Assessment:   Oxygen Re-Evaluation:   Oxygen Discharge (Final Oxygen Re-Evaluation):   Initial Exercise Prescription:  Initial Exercise Prescription - 01/31/21 1600       Date of Initial Exercise RX and Referring Provider   Date 01/31/21    Referring Provider Gwenlyn Found      Oxygen   Maintain Oxygen Saturation 88% or higher      Treadmill   MPH 2.8    Grade 1    Minutes 15    METs 3.6      Recumbant Bike   Level 3    RPM 60    Watts 51    Minutes 15    METs 3.6      NuStep   Level 3    SPM 80    Minutes 15    METs 3.6      REL-XR   Level 3    Speed 50    Minutes 15    METs 3.6       Prescription Details   Frequency (times per week) 3    Duration Progress to 30 minutes of continuous aerobic without signs/symptoms of physical distress      Intensity   THRR 40-80% of Max Heartrate 110-139    Ratings of Perceived Exertion 11-15    Perceived Dyspnea 0-4      Resistance Training   Training Prescription Yes    Weight 4 lb    Reps 10-15             Perform Capillary Blood Glucose checks as needed.  Exercise Prescription Changes:   Exercise Prescription Changes     Row Name 01/31/21 1600 02/12/21 1400 02/25/21 1200 02/25/21 1700 03/13/21 1200     Response to Exercise   Blood Pressure (Admit) 118/60 124/68 128/62 -- 132/72   Blood Pressure (Exercise) 190/76 146/68 -- -- --   Blood Pressure (Exit) 108/54 122/64 100/60 -- 128/60   Heart Rate (Admit) 80 bpm 70 bpm 78 bpm -- 75 bpm   Heart Rate (Exercise) 110 bpm 11 bpm 115 bpm -- 113 bpm   Heart Rate (Exit) 80 bpm 54 bpm 87 bpm -- 80 bpm   Oxygen Saturation (Admit) 97 % -- -- -- --   Oxygen Saturation (Exercise) 97 % -- -- -- --   Rating of Perceived Exertion (Exercise) 11 13 13  -- 12   Perceived Dyspnea (Exercise) 1 -- -- -- --   Symptoms none none none -- none   Duration -- Continue with 30 min of aerobic exercise without signs/symptoms of physical distress. Continue with 30 min of aerobic exercise without signs/symptoms of physical distress. -- Continue with 30 min of aerobic exercise without signs/symptoms of physical distress.   Intensity -- THRR unchanged THRR unchanged -- THRR unchanged  Progression   Progression -- Continue to progress workloads to maintain intensity without signs/symptoms of physical distress. Continue to progress workloads to maintain intensity without signs/symptoms of physical distress. -- Continue to progress workloads to maintain intensity without signs/symptoms of physical distress.   Average METs -- 3.3 3.73 -- 4     Resistance Training   Training Prescription -- Yes Yes  -- Yes   Weight -- 4 lb 4 lb -- 4 lb   Reps -- 10-15 10-15 -- 10-15     Interval Training   Interval Training -- -- No -- No     Treadmill   MPH -- -- 3.2 -- 3.2   Grade -- -- 1 -- 0.5   Minutes -- -- 15 -- 15   METs -- -- 3.89 -- 3.67     Recumbant Bike   Level -- -- 3 -- --   Watts -- -- 32 -- --   Minutes -- -- 15 -- --     NuStep   Level -- -- 3 -- --   Minutes -- -- 15 -- --   METs -- -- 3.6 -- --     REL-XR   Level -- -- 4 -- 5   Minutes -- -- 15 -- 15   METs -- -- 4.2 -- 4.5     Home Exercise Plan   Plans to continue exercise at -- -- -- [employer] (comment)  gym at work [employer] (comment)  gym at work   Frequency -- -- -- Add 2 additional days to program exercise sessions.  start with 1 Add 2 additional days to program exercise sessions.  start with 1   Initial Home Exercises Provided -- -- -- 02/25/21 02/25/21    Row Name 03/27/21 1300 04/09/21 1300 04/22/21 1600         Response to Exercise   Blood Pressure (Admit) 112/58 110/58 130/76     Blood Pressure (Exit) 102/56 108/60 120/60     Heart Rate (Admit) 73 bpm 82 bpm 81 bpm     Heart Rate (Exercise) 99 bpm 100 bpm 91 bpm     Heart Rate (Exit) 82 bpm 85 bpm 84 bpm     Oxygen Saturation (Admit) -- -- 94 %     Oxygen Saturation (Exercise) -- -- 96 %     Oxygen Saturation (Exit) -- -- 96 %     Rating of Perceived Exertion (Exercise) 13 12 12      Symptoms none none none     Duration Continue with 30 min of aerobic exercise without signs/symptoms of physical distress. Continue with 30 min of aerobic exercise without signs/symptoms of physical distress. Continue with 30 min of aerobic exercise without signs/symptoms of physical distress.     Intensity THRR unchanged THRR unchanged THRR unchanged       Progression   Progression Continue to progress workloads to maintain intensity without signs/symptoms of physical distress. Continue to progress workloads to maintain intensity without  signs/symptoms of physical distress. Continue to progress workloads to maintain intensity without signs/symptoms of physical distress.     Average METs 4.71 4.52 5.28       Resistance Training   Training Prescription Yes Yes Yes     Weight 8 lb 8 lb 8 lb     Reps 10-15 10-15 10-15       Interval Training   Interval Training No No No       Treadmill   MPH 3 3.2 3  Grade 5 4.5 4     Minutes $Remove'15 15 15     'eEfDTJk$ METs 5.36 5.44 5.16       Recumbant Bike   Level $Remo'5 5 5     'ZltCJ$ Watts 48 51 --     Minutes $Remove'15 15 15     'YYhgtRE$ METs 5.36 3.7 --       NuStep   Level 5 -- --     Minutes 15 -- --     METs 5.2 -- --       REL-XR   Level 6 -- 6     Minutes 15 -- 15     METs -- -- 5.4       Home Exercise Plan   Plans to continue exercise at [employer] (comment)  gym at work [employer] (comment)  gym at work [employer] (comment)  gym at work     Frequency Add 2 additional days to program exercise sessions.  start with 1 Add 2 additional days to program exercise sessions.  start with 1 Add 2 additional days to program exercise sessions.  start with 1     Initial Home Exercises Provided 02/25/21 02/25/21 02/25/21       Oxygen   Maintain Oxygen Saturation 88% or higher -- 88% or higher              Exercise Comments:   Exercise Goals and Review:   Exercise Goals     Row Name 01/31/21 1634             Exercise Goals   Increase Physical Activity Yes       Intervention Provide advice, education, support and counseling about physical activity/exercise needs.;Develop an individualized exercise prescription for aerobic and resistive training based on initial evaluation findings, risk stratification, comorbidities and participant's personal goals.       Expected Outcomes Short Term: Attend rehab on a regular basis to increase amount of physical activity.;Long Term: Add in home exercise to make exercise part of routine and to increase amount of physical activity.;Long Term:  Exercising regularly at least 3-5 days a week.       Increase Strength and Stamina Yes       Intervention Provide advice, education, support and counseling about physical activity/exercise needs.;Develop an individualized exercise prescription for aerobic and resistive training based on initial evaluation findings, risk stratification, comorbidities and participant's personal goals.       Expected Outcomes Short Term: Increase workloads from initial exercise prescription for resistance, speed, and METs.;Short Term: Perform resistance training exercises routinely during rehab and add in resistance training at home;Long Term: Improve cardiorespiratory fitness, muscular endurance and strength as measured by increased METs and functional capacity (6MWT)       Able to understand and use rate of perceived exertion (RPE) scale Yes       Intervention Provide education and explanation on how to use RPE scale       Expected Outcomes Short Term: Able to use RPE daily in rehab to express subjective intensity level;Long Term:  Able to use RPE to guide intensity level when exercising independently       Able to understand and use Dyspnea scale Yes       Intervention Provide education and explanation on how to use Dyspnea scale       Expected Outcomes Short Term: Able to use Dyspnea scale daily in rehab to express subjective sense of shortness of breath during exertion;Long Term: Able to use  Dyspnea scale to guide intensity level when exercising independently       Knowledge and understanding of Target Heart Rate Range (THRR) Yes       Intervention Provide education and explanation of THRR including how the numbers were predicted and where they are located for reference       Expected Outcomes Short Term: Able to state/look up THRR;Short Term: Able to use daily as guideline for intensity in rehab;Long Term: Able to use THRR to govern intensity when exercising independently       Able to check pulse independently Yes        Intervention Provide education and demonstration on how to check pulse in carotid and radial arteries.;Review the importance of being able to check your own pulse for safety during independent exercise       Expected Outcomes Short Term: Able to explain why pulse checking is important during independent exercise;Long Term: Able to check pulse independently and accurately       Understanding of Exercise Prescription Yes       Intervention Provide education, explanation, and written materials on patient's individual exercise prescription       Expected Outcomes Short Term: Able to explain program exercise prescription;Long Term: Able to explain home exercise prescription to exercise independently                Exercise Goals Re-Evaluation :  Exercise Goals Re-Evaluation     Row Name 02/04/21 1700 02/12/21 1420 02/12/21 1424 02/25/21 1203 02/25/21 1737     Exercise Goal Re-Evaluation   Exercise Goals Review Increase Physical Activity;Able to understand and use rate of perceived exertion (RPE) scale;Knowledge and understanding of Target Heart Rate Range (THRR);Understanding of Exercise Prescription;Increase Strength and Stamina;Able to check pulse independently Increase Physical Activity;Increase Strength and Stamina -- Increase Physical Activity;Increase Strength and Stamina Increase Physical Activity;Increase Strength and Stamina;Understanding of Exercise Prescription   Comments Reviewed RPE and dyspnea scales, THR and program prescription with pt today.  Pt voiced understanding and was given a copy of goals to take home. Jose Edwards has done well in his first sessions.  He reaches lower end of THR range.  Staff will monitor progress. -- Jose Edwards is doing well in rehab. He has increased to speed 3.2 and 1% incline on the treadmill. He also went up to level 4 on the XR. Staff should encourage patient to increase his handweights. Will continue to monitor Reviewed home exercise with pt today.  Pt plans to  go to his gym at work for exercise. Patient states he has multiple aerobic machines to pick from and is not sure which ones he will use yet. I also encouraged him to keep up with resistance training. Reviewed THR, pulse, RPE, sign and symptoms, pulse oximetery and when to call 911 or MD.  Also discussed weather considerations and indoor options.  Pt voiced understanding.   Expected Outcomes Short: Use RPE daily to regulate intensity. Long: Follow program prescription in THR. -- Short : attend consistently Long:  improve overall stamina Short: Increase handweights Long: Continue to increase overall MET level Short: Start with 1 day of exercise at home and check HR Long: Exercise independently at homr at appropriate prescription    Row Name 03/11/21 1745 03/27/21 1316 04/08/21 1719 04/09/21 1409 04/09/21 1410     Exercise Goal Re-Evaluation   Exercise Goals Review Increase Physical Activity Increase Physical Activity;Increase Strength and Stamina;Understanding of Exercise Prescription Increase Physical Activity;Increase Strength and Stamina Increase Physical Activity;Increase Strength and  Stamina Increase Physical Activity;Increase Strength and Stamina   Comments Jose Edwards is not exercising outside program sessions.  He  plans to stay after work at [employer] to use the fitness center once he finishes HT Jose Edwards is doing well in rehab.  He is up to 48 watts on the bike and 5% grade on the treadmill.  We will continue to monitor his progress. Informed Jose Edwards that he can start working out a gym outside of rehab. He plans to go to the [employer] at [place]. He states he can go to the gym for free. He is doing well with his exercise and has no other questions at this time. -- Jose Edwards is progressing well. He attends consistently and works at [employer].  He is up to 8 lb for strength work.  We will continue to monitor progress.   Expected Outcomes Short: continue to exercise consistently Long: maintain exercise on his own Short: Continue to  increase workloads Long: Continue to improve stamina Short: Start working out at [place] a gym post [employer]. -- Short: maintain consistent attendance Long:  maintain exercise on his own    [place] Name 04/22/21 1627             Exercise Goal Re-Evaluation   Exercise Goals Review Increase Physical Activity;Increase Strength and Stamina;Understanding of Exercise Prescription       Comments Jose Edwards improved his walk test by 131 ft!  He is set to graduate this week.  He plans to continue to exercise by going to the gym.       Expected Outcomes Continue to exercise independently                Discharge Exercise Prescription (Final Exercise Prescription Changes):  Exercise Prescription Changes - 04/22/21 1600       Response to Exercise   Blood Pressure (Admit) 130/76    Blood Pressure (Exit) 120/60    Heart Rate (Admit) 81 bpm    Heart Rate (Exercise) 91 bpm    Heart Rate (Exit) 84 bpm    Oxygen Saturation (Admit) 94 %    Oxygen Saturation (Exercise) 96 %    Oxygen Saturation (Exit) 96 %    Rating of Perceived Exertion (Exercise) 12    Symptoms none    Duration Continue with 30 min of aerobic exercise without signs/symptoms of physical distress.    Intensity THRR unchanged      Progression   Progression Continue to progress workloads to maintain intensity without signs/symptoms of physical distress.    Average METs 5.28      Resistance Training   Training Prescription Yes    Weight 8 lb    Reps 10-15      Interval Training   Interval Training No      Treadmill   MPH 3    Grade 4    Minutes 15    METs 5.16      Recumbant Bike   Level 5    Minutes 15      REL-XR   Level 6    Minutes 15    METs 5.4      Home Exercise Plan   Plans to continue exercise at [employer] (comment)   gym at work   Frequency Add 2 additional days to program exercise sessions.   start with 1   Initial Home Exercises Provided 02/25/21      Oxygen   Maintain Oxygen  Saturation 88% or higher  Nutrition:  Target Goals: Understanding of nutrition guidelines, daily intake of sodium '1500mg'$ , cholesterol '200mg'$ , calories 30% from fat and 7% or less from saturated fats, daily to have 5 or more servings of fruits and vegetables.  Education: All About Nutrition: -Group instruction provided by verbal, written material, interactive activities, discussions, models, and posters to present general guidelines for heart healthy nutrition including fat, fiber, MyPlate, the role of sodium in heart healthy nutrition, utilization of the nutrition label, and utilization of this knowledge for meal planning. Follow up email sent as well. Written material given at graduation. Flowsheet Row Cardiac Rehab from 04/17/2021 in [facility] Cardiac and Pulmonary Rehab  Education need identified 01/31/21  Date 02/13/21  Educator [place]  Instruction Review Code 1- Verbalizes Understanding       Biometrics:  Pre Biometrics - 01/31/21 1635       Pre Biometrics   Height $Remov'5\' 7"'mwTIil$  (1.702 m)    Weight 225 lb 6.4 oz (102.2 kg)    BMI (Calculated) 35.29    Single Leg Stand 20 seconds             Post Biometrics - 04/10/21 1743        Post  Biometrics   Height $Remov'5\' 7"'pHwvhg$  (1.702 m)    Weight 227 lb 8 oz (103.2 kg)    BMI (Calculated) 35.62             Nutrition Therapy Plan and Nutrition Goals:  Nutrition Therapy & Goals - 03/11/21 1708       Nutrition Therapy   Diet Heart healthy, low Na    Drug/Food Interactions Statins/Certain Fruits    Protein (specify units) 80g    Fiber 30 grams    Whole Grain Foods 3 servings    Saturated Fats 12 max. grams    Fruits and Vegetables 8 servings/day    Sodium 1.5 grams      Personal Nutrition Goals   Nutrition Goal ST: montior added salt to processed food and cooking, honor hunger - when not eating lunch, make sure to have a satiating snack with protein, fat, and/or fiber (ex: vegetables with hummus, fruit with yogurt) LT:  limit Na <1.5g, avoid hunger at night    Comments PYP: 62. He doesn't eat fried food most of the time, most of the time it is baked or grilled. He declines french fries when he goes out to eat. He will get vegetables when going out to eat. He goes out to eat 2x/week at work. He tried to cut down on portion sizes and snacks. He does not eat right away, will have a nutrition bar for breakfast L: may or may not have lunch - most of the time it is leftover D: spaghetti, or grilled food, with vegetables (right now he loves roasted tomatoes, but he reports getting a good variety of vegetables), potatoes a lot. grilled chicken, sometimes steak, sometimes salmon or shrimp. His wife will use only some salt during cooking, he uses pepper on most foods. He doesn't know if she uses canola or olive oil. He reports getting very hungry at night when he doesn't eat lunch. He likes sweet pickled, but he cut some out and switched to dill pickles, mentioned the main concern there would be salt content. Discussed sodium intake and honoring hunger as well as heart healthy eating.      Intervention Plan   Intervention Prescribe, educate and counsel regarding individualized specific dietary modifications aiming towards targeted core components such as weight, hypertension,  lipid management, diabetes, heart failure and other comorbidities.;Nutrition handout(s) given to patient.    Expected Outcomes Short Term Goal: Understand basic principles of dietary content, such as calories, fat, sodium, cholesterol and nutrients.;Short Term Goal: A plan has been developed with personal nutrition goals set during dietitian appointment.;Long Term Goal: Adherence to prescribed nutrition plan.             Nutrition Assessments:  MEDIFICTS Score Key: ?70 Need to make dietary changes  40-70 Heart Healthy Diet ? 40 Therapeutic Level Cholesterol Diet  Flowsheet Row Cardiac Rehab from 04/18/2021 in [facility] Cardiac and Pulmonary Rehab  Picture  Your Plate Total Score on Discharge 64      Picture Your Plate Scores: <45 Unhealthy dietary pattern with much room for improvement. 41-50 Dietary pattern unlikely to meet recommendations for good health and room for improvement. 51-60 More healthful dietary pattern, with some room for improvement.  >60 Healthy dietary pattern, although there may be some specific behaviors that could be improved.    Nutrition Goals Re-Evaluation:  Nutrition Goals Re-Evaluation     Jose Edwards Name 04/08/21 1723             Goals   Current Weight 226 lb (102.5 kg)       Nutrition Goal Lose some weight.       Comment Danen states that he has cut out all fried foods. He is not sure what he is eating that keeps his weight on.       Expected Outcome Short: maintain a heart healthy diet that pertains to him. Long: reach weight goal of 200's.                Nutrition Goals Discharge (Final Nutrition Goals Re-Evaluation):  Nutrition Goals Re-Evaluation - 04/08/21 1723       Goals   Current Weight 226 lb (102.5 kg)    Nutrition Goal Lose some weight.    Comment Jose Edwards states that he has cut out all fried foods. He is not sure what he is eating that keeps his weight on.    Expected Outcome Short: maintain a heart healthy diet that pertains to him. Long: reach weight goal of 200's.             Psychosocial: Target Goals: Acknowledge presence or absence of significant depression and/or stress, maximize coping skills, provide positive support system. Participant is able to verbalize types and ability to use techniques and skills needed for reducing stress and depression.   Education: Stress, Anxiety, and Depression - Group verbal and visual presentation to define topics covered.  Reviews how body is impacted by stress, anxiety, and depression.  Also discusses healthy ways to reduce stress and to treat/manage anxiety and depression.  Written material given at graduation. Flowsheet Row Cardiac Rehab from  04/17/2021 in [facility] Cardiac and Pulmonary Rehab  Date 03/13/21  Educator [facility]  Instruction Review Code 1- Verbalizes Understanding       Education: Sleep Hygiene -Provides group verbal and written instruction about how sleep can affect your health.  Define sleep hygiene, discuss sleep cycles and impact of sleep habits. Review good sleep hygiene tips.    Initial Review & Psychosocial Screening:  Initial Psych Review & Screening - 01/16/21 1419       Initial Review   Current issues with None Identified      Family Dynamics   Good Support System? Yes   wife, family, coworkers     Barriers   Psychosocial barriers to participate  in program There are no identifiable barriers or psychosocial needs.;The patient should benefit from training in stress management and relaxation.      Screening Interventions   Interventions Encouraged to exercise;Provide feedback about the scores to participant;To provide support and resources with identified psychosocial needs    Expected Outcomes Short Term goal: Utilizing psychosocial counselor, staff and physician to assist with identification of specific Stressors or current issues interfering with healing process. Setting desired goal for each stressor or current issue identified.;Long Term Goal: Stressors or current issues are controlled or eliminated.;Short Term goal: Identification and review with participant of any Quality of Life or Depression concerns found by scoring the questionnaire.;Long Term goal: The participant improves quality of Life and PHQ9 Scores as seen by post scores and/or verbalization of changes             Quality of Life Scores:   Quality of Life - 04/18/21 1709       Quality of Life   Select Quality of Life      Quality of Life Scores   Health/Function Pre 21.2 %    Health/Function Post 25.43 %    Health/Function % Change 19.95 %    Socioeconomic Post 24 %    Psych/Spiritual Pre 22.86 %    Psych/Spiritual Post 26.57 %     Psych/Spiritual % Change 16.23 %    Family Pre 25.2 %    Family Post 28.8 %    Family % Change 14.29 %    GLOBAL Pre 22.79 %    GLOBAL Post 25.88 %    GLOBAL % Change 13.56 %            Scores of 19 and below usually indicate a poorer quality of life in these areas.  A difference of  2-3 points is a clinically meaningful difference.  A difference of 2-3 points in the total score of the Quality of Life Index has been associated with significant improvement in overall quality of life, self-image, physical symptoms, and general health in studies assessing change in quality of life.  PHQ-9: Recent Review Flowsheet Data     Depression screen [facility] 2/9 04/18/2021 01/31/2021   Decreased Interest 0 0   Down, Depressed, Hopeless 0 0   PHQ - 2 Score 0 0   Altered sleeping 1 1   Tired, decreased energy 1 0   Change in appetite 1 0   Feeling bad or failure about yourself  0 0   Trouble concentrating 0 0   Moving slowly or fidgety/restless 0 0   Suicidal thoughts 0 0   PHQ-9 Score 3 1   Difficult doing work/chores Not difficult at all Not difficult at all      Interpretation of Total Score  Total Score Depression Severity:  1-4 = Minimal depression, 5-9 = Mild depression, 10-14 = Moderate depression, 15-19 = Moderately severe depression, 20-27 = Severe depression   Psychosocial Evaluation and Intervention:  Psychosocial Evaluation - 01/16/21 1427       Psychosocial Evaluation & Interventions   Interventions Encouraged to exercise with the program and follow exercise prescription    Comments Jose Edwards reports 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 He does struggle with some fatigue but thinks its coming from his medications. Prior to his MI, he was on vitamins and GERD medicine, so a lot of these are new to him. He has been back to work full time at [employer] for 2 weeks now, still on light duty in the shop,  and reports he is feeling well. He does not report any stress concerns. His sleep issues are not  new, but some nights it is difficult to go to sleep. He has a great support system at home and at work. He is very motivated to make the changes needed to have a heart healthy lifestyle.    Expected Outcomes Short: attend cardiac rehab for education and exercise. Long: develop and maintain positive self care habits.    Continue Psychosocial Services  Follow up required by staff             Psychosocial Re-Evaluation:  Psychosocial Re-Evaluation     Jose Edwards Name 03/11/21 1741 04/08/21 1722           Psychosocial Re-Evaluation   Current issues with Current Stress Concerns;Current Sleep Concerns None Identified      Comments Jose Edwards feels he has more energy.  He gets a little short of breath if he has to bend over.  Going up steps he doesn't get short of breath.  (even carrying tools)  He sleeps well most of the time.  He had a nosebleed Friday night .  He has had these before - it took longer to stop since he is on a blood thinner.  Staff recommended he talk to his Dr if that gets worse Patient reports no issues with their current mental states, sleep, stress, depression or anxiety. Will follow up with patient in a few weeks for any changes.      Expected Outcomes Short:  continue to exercise to help manage stress Long: maintain positive outlook Short: Continue to exercise regularly to support mental health and notify staff of any changes. Long: maintain mental health and well being through teaching of rehab or prescribed medications independently.      Interventions -- Encouraged to attend Cardiac Rehabilitation for the exercise      Continue Psychosocial Services  -- Follow up required by staff               Psychosocial Discharge (Final Psychosocial Re-Evaluation):  Psychosocial Re-Evaluation - 04/08/21 1722       Psychosocial Re-Evaluation   Current issues with None Identified    Comments Patient reports no issues with their current mental states, sleep, stress, depression or  anxiety. Will follow up with patient in a few weeks for any changes.    Expected Outcomes Short: Continue to exercise regularly to support mental health and notify staff of any changes. Long: maintain mental health and well being through teaching of rehab or prescribed medications independently.    Interventions Encouraged to attend Cardiac Rehabilitation for the exercise    Continue Psychosocial Services  Follow up required by staff             Vocational Rehabilitation: Provide vocational rehab assistance to qualifying candidates.   Vocational Rehab Evaluation & Intervention:  Vocational Rehab - 01/16/21 1419       Initial Vocational Rehab Evaluation & Intervention   Assessment shows need for Vocational Rehabilitation No             Education: Education Goals: Education classes will be provided on a variety of topics geared toward better understanding of heart health and risk factor modification. Participant will state understanding/return demonstration of topics presented as noted by education test scores.  Learning Barriers/Preferences:  Learning Barriers/Preferences - 01/16/21 1419       Learning Barriers/Preferences   Learning Barriers None    Learning Preferences None  General Cardiac Education Topics:  AED/CPR: - Group verbal and written instruction with the use of models to demonstrate the basic use of the AED with the basic ABC's of resuscitation.   Anatomy and Cardiac Procedures: - Group verbal and visual presentation and models provide information about basic cardiac anatomy and function. Reviews the testing methods done to diagnose heart disease and the outcomes of the test results. Describes the treatment choices: Medical Management, Angioplasty, or Coronary Bypass Surgery for treating various heart conditions including Myocardial Infarction, Angina, Valve Disease, and Cardiac Arrhythmias.  Written material given at graduation. Flowsheet  Row Cardiac Rehab from 04/17/2021 in [facility] Cardiac and Pulmonary Rehab  Date 04/03/21  Educator [facility]  Instruction Review Code 1- Verbalizes Understanding       Medication Safety: - Group verbal and visual instruction to review commonly prescribed medications for heart and lung disease. Reviews the medication, class of the drug, and side effects. Includes the steps to properly store meds and maintain the prescription regimen.  Written material given at graduation. Flowsheet Row Cardiac Rehab from 04/17/2021 in [facility] Cardiac and Pulmonary Rehab  Date 04/17/21  Educator [facility]  Instruction Review Code 1- Verbalizes Understanding       Intimacy: - Group verbal instruction through game format to discuss how heart and lung disease can affect sexual intimacy. Written material given at graduation..   Know Your Numbers and Heart Failure: - Group verbal and visual instruction to discuss disease risk factors for cardiac and pulmonary disease and treatment options.  Reviews associated critical values for Overweight/Obesity, Hypertension, Cholesterol, and Diabetes.  Discusses basics of heart failure: signs/symptoms and treatments.  Introduces Heart Failure Zone chart for action plan for heart failure.  Written material given at graduation. Flowsheet Row Cardiac Rehab from 04/17/2021 in [facility] Cardiac and Pulmonary Rehab  Education need identified 01/31/21  Date 02/27/21  Educator [place]  Instruction Review Code 1- Verbalizes Understanding       Infection Prevention: - Provides verbal and written material to individual with discussion of infection control including proper hand washing and proper equipment cleaning during exercise session. Flowsheet Row Cardiac Rehab from 04/17/2021 in [facility] Cardiac and Pulmonary Rehab  Date 01/31/21  Educator [facility]  Instruction Review Code 1- Verbalizes Understanding       Falls Prevention: - Provides verbal and written material to individual with discussion of falls  prevention and safety. Flowsheet Row Cardiac Rehab from 04/17/2021 in [facility] Cardiac and Pulmonary Rehab  Date 01/31/21  Educator [facility]  Instruction Review Code 1- Verbalizes Understanding       Other: -Provides group and verbal instruction on various topics (see comments)   Knowledge Questionnaire Score:  Knowledge Questionnaire Score - 04/18/21 1709       Knowledge Questionnaire Score   Post Score 22/26             Core Components/Risk Factors/Patient Goals at Admission:  Personal Goals and Risk Factors at Admission - 01/31/21 1639       Core Components/Risk Factors/Patient Goals on Admission    Weight Management Yes    Intervention Weight Management/Obesity: Establish reasonable short term and long term weight goals.    Admit Weight 225 lb 6.4 oz (102.2 kg)    Goal Weight: Short Term 220 lb (99.8 kg)    Goal Weight: Long Term 215 lb (97.5 kg)    Expected Outcomes Short Term: Continue to assess and modify interventions until short term weight is achieved;Long Term: Adherence to nutrition and physical activity/exercise program aimed  toward attainment of established weight goal;Weight Loss: Understanding of general recommendations for a balanced deficit meal plan, which promotes 1-2 lb weight loss per week and includes a negative energy balance of [phone] kcal/d    Lipids Yes    Intervention Provide education and support for participant on nutrition & aerobic/resistive exercise along with prescribed medications to achieve LDL '70mg'$ , HDL >$Remo'40mg'yzRhE$ .    Expected Outcomes Short Term: Participant states understanding of desired cholesterol values and is compliant with medications prescribed. Participant is following exercise prescription and nutrition guidelines.;Long Term: Cholesterol controlled with medications as prescribed, with individualized exercise RX and with personalized nutrition plan. Value goals: LDL < $Rem'70mg'qEhh$ , HDL > 40 mg.             Education:Diabetes - Individual  verbal and written instruction to review signs/symptoms of diabetes, desired ranges of glucose level fasting, after meals and with exercise. Acknowledge that pre and post exercise glucose checks will be done for 3 sessions at entry of program.   Core Components/Risk Factors/Patient Goals Review:   Goals and Risk Factor Review     Row Name 03/11/21 1737 03/11/21 1739 03/11/21 1745 04/08/21 1728       Core Components/Risk Factors/Patient Goals Review   Personal Goals Review Weight Management/Obesity -- -- Weight Management/Obesity    Review -- Jose Edwards's weight varies between 220-225.  He has been able to go down one belt notch.  He is taking medications as directed. He sees cardilogist tomorrow for follow up. Jose Edwards wants to lose some weight and get in the lower 200 range. He has cut out fried foods in his diet. He is going to try to eat less and not over eat.    Expected Outcomes -- Short: continue to exercise consistently Long: reach goal weight -- Short: Lose 5 pounds in the next couple weeks.  Long: reach goal weight of lower 200's.             Core Components/Risk Factors/Patient Goals at Discharge (Final Review):   Goals and Risk Factor Review - 04/08/21 1728       Core Components/Risk Factors/Patient Goals Review   Personal Goals Review Weight Management/Obesity    Review Jose Edwards wants to lose some weight and get in the lower 200 range. He has cut out fried foods in his diet. He is going to try to eat less and not over eat.    Expected Outcomes Short: Lose 5 pounds in the next couple weeks.  Long: reach goal weight of lower 200's.             ITP Comments:  ITP Comments     Row Name 01/16/21 1403 01/31/21 1642 02/04/21 1659 02/27/21 0635 03/11/21 1749   ITP Comments Initial telephone orientation completed. Diagnosis can be found in [facility] 8/29. EP orientation scheduled for Thursday 11/3 at 3pm. Completed 6MWT and gym orientation. Initial ITP created and sent for review to Dr. Emily Filbert, Medical Director. First full day of exercise!  Patient was oriented to gym and equipment including functions, settings, policies, and procedures.  Patient's individual exercise prescription and treatment plan were reviewed.  All starting workloads were established based on the results of the 6 minute walk test done at initial orientation visit.  The plan for exercise progression was also introduced and progression will be customized based on patient's performance and goals. 30 Day review completed. Medical Director ITP review done, changes made as directed, and signed approval by Medical Director. Completed initial RD consultation  Jose Edwards Name 03/27/21 0657 04/24/21 0830         ITP Comments 30 Day review completed. Medical Director ITP review done, changes made as directed, and signed approval by Medical Director. 30 Day review completed. Medical Director ITP review done, changes made as directed, and signed approval by Medical Director.               Comments:

## 2021-04-24 NOTE — Progress Notes (Signed)
Cardiac Individual Treatment Plan  Patient Details  Name: Jose Edwards MRN: [id number] Date of Birth: Aug 04, 1955 Referring Provider:   Flowsheet Row Cardiac Rehab from 01/31/2021 in [facility] Cardiac and Pulmonary Rehab  Referring Provider Gwenlyn Found       Initial Encounter Date:  Flowsheet Row Cardiac Rehab from 01/31/2021 in [facility] Cardiac and Pulmonary Rehab  Date 01/31/21       Visit Diagnosis: ST elevation myocardial infarction (STEMI), unspecified artery ([place])  Status post coronary artery stent placement  Patient's Home Medications on Admission:  Current Outpatient Medications:    aspirin 81 MG EC tablet, Take 1 tablet (81 mg total) by mouth daily. Swallow whole., Disp: 90 tablet, Rfl: 3   atorvastatin (LIPITOR) 80 MG tablet, Take 1 tablet (80 mg total) by mouth daily., Disp: 90 tablet, Rfl: 3   benzonatate (TESSALON PERLES) 100 MG capsule, 1-2 capsules every  8 hours as needed for cough, Disp: 30 capsule, Rfl: 0   Cholecalciferol (VITAMIN D3) 50 MCG (2000 UT) TABS, Take 2,000 Units by mouth 2 (two) times a week., Disp: , Rfl:    clopidogrel (PLAVIX) 75 MG tablet, Take 8 tablets (600 mg total) by mouth daily for 1 day, THEN 1 tablet (75 mg total) daily., Disp: 98 tablet, Rfl: 3   dapagliflozin propanediol (FARXIGA) 10 MG TABS tablet, Take 1 tablet (10 mg total) by mouth daily before breakfast., Disp: 30 tablet, Rfl: 11   fluticasone (FLONASE) 50 MCG/ACT nasal spray, Place 2 sprays into both nostrils daily., Disp: 16 g, Rfl: 2   losartan (COZAAR) 50 MG tablet, Take 1 tablet (50 mg total) by mouth daily., Disp: 90 tablet, Rfl: 3   metoprolol succinate (TOPROL-XL) 50 MG 24 hr tablet, Take 1 tablet (50 mg total) by mouth daily. Take with or immediately following a meal., Disp: 30 tablet, Rfl: 11   nitroGLYCERIN (NITROSTAT) 0.4 MG SL tablet, Place 1 tablet (0.4 mg total) under the tongue every 5 (five) minutes as needed for chest pain., Disp: 25 tablet, Rfl: 1   omeprazole (PRILOSEC  OTC) 20 MG tablet, Take 20 mg by mouth daily as needed (for heartburn)., Disp: , Rfl:    vitamin B-12 (CYANOCOBALAMIN) 100 MCG tablet, Take 100 mcg by mouth every other day. , Disp: , Rfl:    vitamin C (ASCORBIC ACID) 500 MG tablet, Take 500-1,000 mg by mouth daily., Disp: , Rfl:   Past Medical History: Past Medical History:  Diagnosis Date   Acute ST elevation myocardial infarction (STEMI) of anterolateral wall ([place]) 11/26/2020   100% D1 - DES PCI 2.25 x 16 (2.4 mm)   Benign positional vertigo    CAD S/P DES PCI D1  11/27/2020   100% D1 (DES PCI with Onyx Frontier DES 2.25 x 18 - deployed to 2.4 mm)   Coronary artery disease involving native coronary artery of native heart with angina pectoris ([place]) 11/26/2020   8/29 ([place] STEMI): Cath => Culprit lesion-100% D1 (DES PCI with Onyx Frontier DES 2.25 x 18 - deployed to 2.4 mm). Mid LM 30%, Ost-prox LAD 30% & RI 30%.     ED (erectile dysfunction)    GERD (gastroesophageal reflux disease)    Osteoarthrosis, unspecified whether generalized or localized, unspecified site     Tobacco Use: Social History   Tobacco Use  Smoking Status Never  Smokeless Tobacco Never    Labs: Recent Review Flowsheet Data     Labs for ITP Cardiac and Pulmonary Rehab Latest Ref Rng & Units 02/27/2010 06/21/2013 05/21/2016  11/26/2020 01/09/2021   Cholestrol 100 - 199 mg/dL 221(H) 205(A) 200 198 124   LDLCALC 0 - 99 mg/dL - 141 143(H) 134(H) 70   LDLDIRECT mg/dL 155.1 - - - -   HDL >39 mg/dL 39.20 - 39.40 38(L) 41   Trlycerides 0 - 149 mg/dL 158.0(H) - 88.0 132 59   Hemoglobin A1c 4.8 - 5.6 % - - - 5.6 -        Exercise Target Goals: Exercise Program Goal: Individual exercise prescription set using results from initial 6 min walk test and THRR while considering  patients activity barriers and safety.   Exercise Prescription Goal: Initial exercise prescription builds to 30-45 minutes a day of aerobic activity, 2-3 days per week.  Home exercise guidelines  will be given to patient during program as part of exercise prescription that the participant will acknowledge.   Education: Aerobic Exercise: - Group verbal and visual presentation on the components of exercise prescription. Introduces F.I.T.T principle from ACSM for exercise prescriptions.  Reviews F.I.T.T. principles of aerobic exercise including progression. Written material given at graduation.   Education: Resistance Exercise: - Group verbal and visual presentation on the components of exercise prescription. Introduces F.I.T.T principle from ACSM for exercise prescriptions  Reviews F.I.T.T. principles of resistance exercise including progression. Written material given at graduation. Flowsheet Row Cardiac Rehab from 04/24/2021 in [facility] Cardiac and Pulmonary Rehab  Date 04/03/21  Educator AS  Instruction Review Code 1- Verbalizes Understanding        Education: Exercise & Equipment Safety: - Individual verbal instruction and demonstration of equipment use and safety with use of the equipment. Flowsheet Row Cardiac Rehab from 04/24/2021 in [facility] Cardiac and Pulmonary Rehab  Date 01/31/21  Educator AS  Instruction Review Code 1- Verbalizes Understanding       Education: Exercise Physiology & General Exercise Guidelines: - Group verbal and written instruction with models to review the exercise physiology of the cardiovascular system and associated critical values. Provides general exercise guidelines with specific guidelines to those with heart or lung disease.    Education: Flexibility, Balance, Mind/Body Relaxation: - Group verbal and visual presentation with interactive activity on the components of exercise prescription. Introduces F.I.T.T principle from ACSM for exercise prescriptions. Reviews F.I.T.T. principles of flexibility and balance exercise training including progression. Also discusses the mind body connection.  Reviews various relaxation techniques to help reduce and  manage stress (i.e. Deep breathing, progressive muscle relaxation, and visualization). Balance handout provided to take home. Written material given at graduation. Flowsheet Row Cardiac Rehab from 04/24/2021 in [facility] Cardiac and Pulmonary Rehab  Date 04/10/21  Educator AS  Instruction Review Code 1- Verbalizes Understanding       Activity Barriers & Risk Stratification:  Activity Barriers & Cardiac Risk Stratification - 01/16/21 1416       Activity Barriers & Cardiac Risk Stratification   Activity Barriers Other (comment)    Comments left ankle has screws    Cardiac Risk Stratification Moderate             6 Minute Walk:  6 Minute Walk     Row Name 01/31/21 1627 04/10/21 1742       6 Minute Walk   Phase Initial Initial    Distance 1485 feet 1655 feet    Distance % Change -- 11.4 %    Distance Feet Change -- 170 ft    Walk Time 6 minutes 6 minutes    # of Rest Breaks 0 0  MPH 2.8 3.13    METS 3.6 3.51    RPE 11 12    Perceived Dyspnea  1 0    VO2 Peak 12.47 12.31    Symptoms No No    Resting HR 80 bpm 73 bpm    Resting BP 118/60 128/70    Resting Oxygen Saturation  97 % 96 %    Exercise Oxygen Saturation  during 6 min walk 97 % 96 %    Max Ex. HR 110 bpm 106 bpm    Max Ex. BP 190/76 156/70    2 Minute Post BP 108/54 --             Oxygen Initial Assessment:   Oxygen Re-Evaluation:   Oxygen Discharge (Final Oxygen Re-Evaluation):   Initial Exercise Prescription:  Initial Exercise Prescription - 01/31/21 1600       Date of Initial Exercise RX and Referring Provider   Date 01/31/21    Referring Provider Gwenlyn Found      Oxygen   Maintain Oxygen Saturation 88% or higher      Treadmill   MPH 2.8    Grade 1    Minutes 15    METs 3.6      Recumbant Bike   Level 3    RPM 60    Watts 51    Minutes 15    METs 3.6      NuStep   Level 3    SPM 80    Minutes 15    METs 3.6      REL-XR   Level 3    Speed 50    Minutes 15    METs 3.6       Prescription Details   Frequency (times per week) 3    Duration Progress to 30 minutes of continuous aerobic without signs/symptoms of physical distress      Intensity   THRR 40-80% of Max Heartrate 110-139    Ratings of Perceived Exertion 11-15    Perceived Dyspnea 0-4      Resistance Training   Training Prescription Yes    Weight 4 lb    Reps 10-15             Perform Capillary Blood Glucose checks as needed.  Exercise Prescription Changes:   Exercise Prescription Changes     Row Name 01/31/21 1600 02/12/21 1400 02/25/21 1200 02/25/21 1700 03/13/21 1200     Response to Exercise   Blood Pressure (Admit) 118/60 124/68 128/62 -- 132/72   Blood Pressure (Exercise) 190/76 146/68 -- -- --   Blood Pressure (Exit) 108/54 122/64 100/60 -- 128/60   Heart Rate (Admit) 80 bpm 70 bpm 78 bpm -- 75 bpm   Heart Rate (Exercise) 110 bpm 11 bpm 115 bpm -- 113 bpm   Heart Rate (Exit) 80 bpm 54 bpm 87 bpm -- 80 bpm   Oxygen Saturation (Admit) 97 % -- -- -- --   Oxygen Saturation (Exercise) 97 % -- -- -- --   Rating of Perceived Exertion (Exercise) 11 13 13  -- 12   Perceived Dyspnea (Exercise) 1 -- -- -- --   Symptoms none none none -- none   Duration -- Continue with 30 min of aerobic exercise without signs/symptoms of physical distress. Continue with 30 min of aerobic exercise without signs/symptoms of physical distress. -- Continue with 30 min of aerobic exercise without signs/symptoms of physical distress.   Intensity -- THRR unchanged THRR unchanged -- THRR unchanged  Progression   Progression -- Continue to progress workloads to maintain intensity without signs/symptoms of physical distress. Continue to progress workloads to maintain intensity without signs/symptoms of physical distress. -- Continue to progress workloads to maintain intensity without signs/symptoms of physical distress.   Average METs -- 3.3 3.73 -- 4     Resistance Training   Training Prescription -- Yes Yes  -- Yes   Weight -- 4 lb 4 lb -- 4 lb   Reps -- 10-15 10-15 -- 10-15     Interval Training   Interval Training -- -- No -- No     Treadmill   MPH -- -- 3.2 -- 3.2   Grade -- -- 1 -- 0.5   Minutes -- -- 15 -- 15   METs -- -- 3.89 -- 3.67     Recumbant Bike   Level -- -- 3 -- --   Watts -- -- 32 -- --   Minutes -- -- 15 -- --     NuStep   Level -- -- 3 -- --   Minutes -- -- 15 -- --   METs -- -- 3.6 -- --     REL-XR   Level -- -- 4 -- 5   Minutes -- -- 15 -- 15   METs -- -- 4.2 -- 4.5     Home Exercise Plan   Plans to continue exercise at -- -- -- [employer] (comment)  gym at work [employer] (comment)  gym at work   Frequency -- -- -- Add 2 additional days to program exercise sessions.  start with 1 Add 2 additional days to program exercise sessions.  start with 1   Initial Home Exercises Provided -- -- -- 02/25/21 02/25/21    Row Name 03/27/21 1300 04/09/21 1300 04/22/21 1600         Response to Exercise   Blood Pressure (Admit) 112/58 110/58 130/76     Blood Pressure (Exit) 102/56 108/60 120/60     Heart Rate (Admit) 73 bpm 82 bpm 81 bpm     Heart Rate (Exercise) 99 bpm 100 bpm 91 bpm     Heart Rate (Exit) 82 bpm 85 bpm 84 bpm     Oxygen Saturation (Admit) -- -- 94 %     Oxygen Saturation (Exercise) -- -- 96 %     Oxygen Saturation (Exit) -- -- 96 %     Rating of Perceived Exertion (Exercise) 13 12 12      Symptoms none none none     Duration Continue with 30 min of aerobic exercise without signs/symptoms of physical distress. Continue with 30 min of aerobic exercise without signs/symptoms of physical distress. Continue with 30 min of aerobic exercise without signs/symptoms of physical distress.     Intensity THRR unchanged THRR unchanged THRR unchanged       Progression   Progression Continue to progress workloads to maintain intensity without signs/symptoms of physical distress. Continue to progress workloads to maintain intensity without  signs/symptoms of physical distress. Continue to progress workloads to maintain intensity without signs/symptoms of physical distress.     Average METs 4.71 4.52 5.28       Resistance Training   Training Prescription Yes Yes Yes     Weight 8 lb 8 lb 8 lb     Reps 10-15 10-15 10-15       Interval Training   Interval Training No No No       Treadmill   MPH 3 3.2 3  Grade 5 4.5 4     Minutes $Remove'15 15 15     'RSCkXUG$ METs 5.36 5.44 5.16       Recumbant Bike   Level $Remo'5 5 5     'JqqNy$ Watts 48 51 --     Minutes $Remove'15 15 15     'NcbCcgi$ METs 5.36 3.7 --       NuStep   Level 5 -- --     Minutes 15 -- --     METs 5.2 -- --       REL-XR   Level 6 -- 6     Minutes 15 -- 15     METs -- -- 5.4       Home Exercise Plan   Plans to continue exercise at [employer] (comment)  gym at work [employer] (comment)  gym at work [employer] (comment)  gym at work     Frequency Add 2 additional days to program exercise sessions.  start with 1 Add 2 additional days to program exercise sessions.  start with 1 Add 2 additional days to program exercise sessions.  start with 1     Initial Home Exercises Provided 02/25/21 02/25/21 02/25/21       Oxygen   Maintain Oxygen Saturation 88% or higher -- 88% or higher              Exercise Comments:   Exercise Comments     Row Name 04/24/21 1631           Exercise Comments Jose Edwards graduated today from  rehab with 36 sessions completed.  Details of the patient's exercise prescription and what He needs to do in order to continue the prescription and progress were discussed with patient.  Patient was given a copy of prescription and goals.  Patient verbalized understanding.  Jose Edwards plans to continue to exercise by going to the gym at work.                Exercise Goals and Review:   Exercise Goals     Row Name 01/31/21 1634             Exercise Goals   Increase Physical Activity Yes       Intervention Provide advice, education, support and  counseling about physical activity/exercise needs.;Develop an individualized exercise prescription for aerobic and resistive training based on initial evaluation findings, risk stratification, comorbidities and participant's personal goals.       Expected Outcomes Short Term: Attend rehab on a regular basis to increase amount of physical activity.;Long Term: Add in home exercise to make exercise part of routine and to increase amount of physical activity.;Long Term: Exercising regularly at least 3-5 days a week.       Increase Strength and Stamina Yes       Intervention Provide advice, education, support and counseling about physical activity/exercise needs.;Develop an individualized exercise prescription for aerobic and resistive training based on initial evaluation findings, risk stratification, comorbidities and participant's personal goals.       Expected Outcomes Short Term: Increase workloads from initial exercise prescription for resistance, speed, and METs.;Short Term: Perform resistance training exercises routinely during rehab and add in resistance training at home;Long Term: Improve cardiorespiratory fitness, muscular endurance and strength as measured by increased METs and functional capacity (6MWT)       Able to understand and use rate of perceived exertion (RPE) scale Yes       Intervention Provide education and explanation on how to use  RPE scale       Expected Outcomes Short Term: Able to use RPE daily in rehab to express subjective intensity level;Long Term:  Able to use RPE to guide intensity level when exercising independently       Able to understand and use Dyspnea scale Yes       Intervention Provide education and explanation on how to use Dyspnea scale       Expected Outcomes Short Term: Able to use Dyspnea scale daily in rehab to express subjective sense of shortness of breath during exertion;Long Term: Able to use Dyspnea scale to guide intensity level when exercising independently        Knowledge and understanding of Target Heart Rate Range (THRR) Yes       Intervention Provide education and explanation of THRR including how the numbers were predicted and where they are located for reference       Expected Outcomes Short Term: Able to state/look up THRR;Short Term: Able to use daily as guideline for intensity in rehab;Long Term: Able to use THRR to govern intensity when exercising independently       Able to check pulse independently Yes       Intervention Provide education and demonstration on how to check pulse in carotid and radial arteries.;Review the importance of being able to check your own pulse for safety during independent exercise       Expected Outcomes Short Term: Able to explain why pulse checking is important during independent exercise;Long Term: Able to check pulse independently and accurately       Understanding of Exercise Prescription Yes       Intervention Provide education, explanation, and written materials on patient's individual exercise prescription       Expected Outcomes Short Term: Able to explain program exercise prescription;Long Term: Able to explain home exercise prescription to exercise independently                Exercise Goals Re-Evaluation :  Exercise Goals Re-Evaluation     Row Name 02/04/21 1700 02/12/21 1420 02/12/21 1424 02/25/21 1203 02/25/21 1737     Exercise Goal Re-Evaluation   Exercise Goals Review Increase Physical Activity;Able to understand and use rate of perceived exertion (RPE) scale;Knowledge and understanding of Target Heart Rate Range (THRR);Understanding of Exercise Prescription;Increase Strength and Stamina;Able to check pulse independently Increase Physical Activity;Increase Strength and Stamina -- Increase Physical Activity;Increase Strength and Stamina Increase Physical Activity;Increase Strength and Stamina;Understanding of Exercise Prescription   Comments Reviewed RPE and dyspnea scales, THR and program  prescription with pt today.  Pt voiced understanding and was given a copy of goals to take home. Jose Edwards has done well in his first sessions.  He reaches lower end of THR range.  Staff will monitor progress. -- Jose Edwards is doing well in rehab. He has increased to speed 3.2 and 1% incline on the treadmill. He also went up to level 4 on the XR. Staff should encourage patient to increase his handweights. Will continue to monitor Reviewed home exercise with pt today.  Pt plans to go to his gym at work for exercise. Patient states he has multiple aerobic machines to pick from and is not sure which ones he will use yet. I also encouraged him to keep up with resistance training. Reviewed THR, pulse, RPE, sign and symptoms, pulse oximetery and when to call 911 or MD.  Also discussed weather considerations and indoor options.  Pt voiced understanding.   Expected Outcomes Short: Use RPE  daily to regulate intensity. Long: Follow program prescription in THR. -- Short : attend consistently Long:  improve overall stamina Short: Increase handweights Long: Continue to increase overall MET level Short: Start with 1 day of exercise at home and check HR Long: Exercise independently at homr at appropriate prescription    Row Name 03/11/21 1745 03/27/21 1316 04/08/21 1719 04/09/21 1409 04/09/21 1410     Exercise Goal Re-Evaluation   Exercise Goals Review Increase Physical Activity Increase Physical Activity;Increase Strength and Stamina;Understanding of Exercise Prescription Increase Physical Activity;Increase Strength and Stamina Increase Physical Activity;Increase Strength and Stamina Increase Physical Activity;Increase Strength and Stamina   Comments Jose Edwards is not exercising outside program sessions.  He  plans to stay after work at [employer] to use the fitness center once he finishes HT Jose Edwards is doing well in rehab.  He is up to 48 watts on the bike and 5% grade on the treadmill.  We will continue to monitor his progress. Informed Jose Edwards  that he can start working out a gym outside of rehab. He plans to go to the [facility] at [place]. He states he can go to the gym for free. He is doing well with his exercise and has no other questions at this time. -- Jose Edwards is progressing well. He attends consistently and works at [employer] 12-13.  He is up to 8 lb for strength work.  We will continue to monitor progress.   Expected Outcomes Short: continue to exercise consistently Long: maintain exercise on his own Short: Continue to increase workloads Long: Continue to improve stamina Short: Start working out at [employer]: Join a gym post [employer]. -- Short: maintain consistent attendance Long:  maintain exercise on his own    Row Name 04/22/21 1627             Exercise Goal Re-Evaluation   Exercise Goals Review Increase Physical Activity;Increase Strength and Stamina;Understanding of Exercise Prescription       Comments Jose Edwards improved his walk test by 131 ft!  He is set to graduate this week.  He plans to continue to exercise by going to the gym.       Expected Outcomes Continue to exercise independently                Discharge Exercise Prescription (Final Exercise Prescription Changes):  Exercise Prescription Changes - 04/22/21 1600       Response to Exercise   Blood Pressure (Admit) 130/76    Blood Pressure (Exit) 120/60    Heart Rate (Admit) 81 bpm    Heart Rate (Exercise) 91 bpm    Heart Rate (Exit) 84 bpm    Oxygen Saturation (Admit) 94 %    Oxygen Saturation (Exercise) 96 %    Oxygen Saturation (Exit) 96 %    Rating of Perceived Exertion (Exercise) 12    Symptoms none    Duration Continue with 30 min of aerobic exercise without signs/symptoms of physical distress.    Intensity THRR unchanged      Progression   Progression Continue to progress workloads to maintain intensity without signs/symptoms of physical distress.    Average METs 5.28      Resistance Training   Training Prescription Yes    Weight 8 lb    Reps 10-15       Interval Training   Interval Training No      Treadmill   MPH 3    Grade 4    Minutes 15    METs 5.16  Recumbant Bike   Level 5    Minutes 15      REL-XR   Level 6    Minutes 15    METs 5.4      Home Exercise Plan   Plans to continue exercise at [employer] (comment)   gym at work   Frequency Add 2 additional days to program exercise sessions.   start with 1   Initial Home Exercises Provided 02/25/21      Oxygen   Maintain Oxygen Saturation 88% or higher             Nutrition:  Target Goals: Understanding of nutrition guidelines, daily intake of sodium '1500mg'$ , cholesterol '200mg'$ , calories 30% from fat and 7% or less from saturated fats, daily to have 5 or more servings of fruits and vegetables.  Education: All About Nutrition: -Group instruction provided by verbal, written material, interactive activities, discussions, models, and posters to present general guidelines for heart healthy nutrition including fat, fiber, MyPlate, the role of sodium in heart healthy nutrition, utilization of the nutrition label, and utilization of this knowledge for meal planning. Follow up email sent as well. Written material given at graduation. Flowsheet Row Cardiac Rehab from 04/24/2021 in [facility] Cardiac and Pulmonary Rehab  Education need identified 01/31/21  Date 04/24/21  Educator [place]  Instruction Review Code 1- Verbalizes Understanding       Biometrics:  Pre Biometrics - 01/31/21 1635       Pre Biometrics   Height $Remov'5\' 7"'JPnQlY$  (1.702 m)    Weight 225 lb 6.4 oz (102.2 kg)    BMI (Calculated) 35.29    Single Leg Stand 20 seconds             Post Biometrics - 04/10/21 1743        Post  Biometrics   Height $Remov'5\' 7"'VnKsNh$  (1.702 m)    Weight 227 lb 8 oz (103.2 kg)    BMI (Calculated) 35.62             Nutrition Therapy Plan and Nutrition Goals:  Nutrition Therapy & Goals - 03/11/21 1708       Nutrition Therapy   Diet Heart healthy, low Na    Drug/Food  Interactions Statins/Certain Fruits    Protein (specify units) 80g    Fiber 30 grams    Whole Grain Foods 3 servings    Saturated Fats 12 max. grams    Fruits and Vegetables 8 servings/day    Sodium 1.5 grams      Personal Nutrition Goals   Nutrition Goal ST: montior added salt to processed food and cooking, honor hunger - when not eating lunch, make sure to have a satiating snack with protein, fat, and/or fiber (ex: vegetables with hummus, fruit with yogurt) LT: limit Na <1.5g, avoid hunger at night    Comments PYP: 62. He doesn't eat fried food most of the time, most of the time it is baked or grilled. He declines french fries when he goes out to eat. He will get vegetables when going out to eat. He goes out to eat 2x/week at work. He tried to cut down on portion sizes and snacks. He does not eat right away, will have a nutrition bar for breakfast L: may or may not have lunch - most of the time it is leftover D: spaghetti, or grilled food, with vegetables (right now he loves roasted tomatoes, but he reports getting a good variety of vegetables), potatoes a lot. grilled chicken, sometimes steak, sometimes  salmon or shrimp. His wife will use only some salt during cooking, he uses pepper on most foods. He doesn't know if she uses canola or olive oil. He reports getting very hungry at night when he doesn't eat lunch. He likes sweet pickled, but he cut some out and switched to dill pickles, mentioned the main concern there would be salt content. Discussed sodium intake and honoring hunger as well as heart healthy eating.      Intervention Plan   Intervention Prescribe, educate and counsel regarding individualized specific dietary modifications aiming towards targeted core components such as weight, hypertension, lipid management, diabetes, heart failure and other comorbidities.;Nutrition handout(s) given to patient.    Expected Outcomes Short Term Goal: Understand basic principles of dietary content, such  as calories, fat, sodium, cholesterol and nutrients.;Short Term Goal: A plan has been developed with personal nutrition goals set during dietitian appointment.;Long Term Goal: Adherence to prescribed nutrition plan.             Nutrition Assessments:  MEDIFICTS Score Key: ?70 Need to make dietary changes  40-70 Heart Healthy Diet ? 40 Therapeutic Level Cholesterol Diet  Flowsheet Row Cardiac Rehab from 04/18/2021 in [facility] Cardiac and Pulmonary Rehab  Picture Your Plate Total Score on Discharge 64      Picture Your Plate Scores: <88 Unhealthy dietary pattern with much room for improvement. 41-50 Dietary pattern unlikely to meet recommendations for good health and room for improvement. 51-60 More healthful dietary pattern, with some room for improvement.  >60 Healthy dietary pattern, although there may be some specific behaviors that could be improved.    Nutrition Goals Re-Evaluation:  Nutrition Goals Re-Evaluation     [place] Name 04/08/21 1723             Goals   Current Weight 226 lb (102.5 kg)       Nutrition Goal Lose some weight.       Comment Jose Edwards states that he has cut out all fried foods. He is not sure what he is eating that keeps his weight on.       Expected Outcome Short: maintain a heart healthy diet that pertains to him. Long: reach weight goal of 200's.                Nutrition Goals Discharge (Final Nutrition Goals Re-Evaluation):  Nutrition Goals Re-Evaluation - 04/08/21 1723       Goals   Current Weight 226 lb (102.5 kg)    Nutrition Goal Lose some weight.    Comment Jose Edwards states that he has cut out all fried foods. He is not sure what he is eating that keeps his weight on.    Expected Outcome Short: maintain a heart healthy diet that pertains to him. Long: reach weight goal of 200's.             Psychosocial: Target Goals: Acknowledge presence or absence of significant depression and/or stress, maximize coping skills, provide positive  support system. Participant is able to verbalize types and ability to use techniques and skills needed for reducing stress and depression.   Education: Stress, Anxiety, and Depression - Group verbal and visual presentation to define topics covered.  Reviews how body is impacted by stress, anxiety, and depression.  Also discusses healthy ways to reduce stress and to treat/manage anxiety and depression.  Written material given at graduation. Flowsheet Row Cardiac Rehab from 04/24/2021 in [facility] Cardiac and Pulmonary Rehab  Date 03/13/21  Educator [facility]  Instruction Review Code  1- Verbalizes Understanding       Education: Sleep Hygiene -Provides group verbal and written instruction about how sleep can affect your health.  Define sleep hygiene, discuss sleep cycles and impact of sleep habits. Review good sleep hygiene tips.    Initial Review & Psychosocial Screening:  Initial Psych Review & Screening - 01/16/21 1419       Initial Review   Current issues with None Identified      Family Dynamics   Good Support System? Yes   wife, family, coworkers     Barriers   Psychosocial barriers to participate in program There are no identifiable barriers or psychosocial needs.;The patient should benefit from training in stress management and relaxation.      Screening Interventions   Interventions Encouraged to exercise;Provide feedback about the scores to participant;To provide support and resources with identified psychosocial needs    Expected Outcomes Short Term goal: Utilizing psychosocial counselor, staff and physician to assist with identification of specific Stressors or current issues interfering with healing process. Setting desired goal for each stressor or current issue identified.;Long Term Goal: Stressors or current issues are controlled or eliminated.;Short Term goal: Identification and review with participant of any Quality of Life or Depression concerns found by scoring the  questionnaire.;Long Term goal: The participant improves quality of Life and PHQ9 Scores as seen by post scores and/or verbalization of changes             Quality of Life Scores:   Quality of Life - 04/18/21 1709       Quality of Life   Select Quality of Life      Quality of Life Scores   Health/Function Pre 21.2 %    Health/Function Post 25.43 %    Health/Function % Change 19.95 %    Socioeconomic Post 24 %    Psych/Spiritual Pre 22.86 %    Psych/Spiritual Post 26.57 %    Psych/Spiritual % Change 16.23 %    Family Pre 25.2 %    Family Post 28.8 %    Family % Change 14.29 %    GLOBAL Pre 22.79 %    GLOBAL Post 25.88 %    GLOBAL % Change 13.56 %            Scores of 19 and below usually indicate a poorer quality of life in these areas.  A difference of  2-3 points is a clinically meaningful difference.  A difference of 2-3 points in the total score of the Quality of Life Index has been associated with significant improvement in overall quality of life, self-image, physical symptoms, and general health in studies assessing change in quality of life.  PHQ-9: Recent Review Flowsheet Data     Depression screen [facility] 2/9 04/18/2021 01/31/2021   Decreased Interest 0 0   Down, Depressed, Hopeless 0 0   PHQ - 2 Score 0 0   Altered sleeping 1 1   Tired, decreased energy 1 0   Change in appetite 1 0   Feeling bad or failure about yourself  0 0   Trouble concentrating 0 0   Moving slowly or fidgety/restless 0 0   Suicidal thoughts 0 0   PHQ-9 Score 3 1   Difficult doing work/chores Not difficult at all Not difficult at all      Interpretation of Total Score  Total Score Depression Severity:  1-4 = Minimal depression, 5-9 = Mild depression, 10-14 = Moderate depression, 15-19 = Moderately severe depression, 20-27 =  Severe depression   Psychosocial Evaluation and Intervention:  Psychosocial Evaluation - 01/16/21 1427       Psychosocial Evaluation & Interventions    Interventions Encouraged to exercise with the program and follow exercise prescription    Comments 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 He does struggle with some fatigue but thinks its coming from his medications. Prior to his MI, he was on vitamins and GERD medicine, so a lot of these are new to him. He has been back to work full time at [employer] for 2 weeks now, still on light duty in the shop, and reports he is feeling well. He does not report any stress concerns. His sleep issues are not new, but some nights it is difficult to go to sleep. He has a great support system at home and at work. He is very motivated to make the changes needed to have a heart healthy lifestyle.    Expected Outcomes Short: attend cardiac rehab for education and exercise. Long: develop and maintain positive self care habits.    Continue Psychosocial Services  Follow up required by staff             Psychosocial Re-Evaluation:  Psychosocial Re-Evaluation     Row Name 03/11/21 1741 04/08/21 1722           Psychosocial Re-Evaluation   Current issues with Current Stress Concerns;Current Sleep Concerns None Identified      Comments Jose Edwards feels he has more energy.  He gets a little short of breath if he has to bend over.  Going up steps he doesn't get short of breath.  (even carrying tools)  He sleeps well most of the time.  He had a nosebleed Friday night .  He has had these before - it took longer to stop since he is on a blood thinner.  Staff recommended he talk to his Dr if that gets worse Patient reports no issues with their current mental states, sleep, stress, depression or anxiety. Will follow up with patient in a few weeks for any changes.      Expected Outcomes Short:  continue to exercise to help manage stress Long: maintain positive outlook Short: Continue to exercise regularly to support mental health and notify staff of any changes. Long: maintain mental health and well being through teaching of rehab or  prescribed medications independently.      Interventions -- Encouraged to attend Cardiac Rehabilitation for the exercise      Continue Psychosocial Services  -- Follow up required by staff               Psychosocial Discharge (Final Psychosocial Re-Evaluation):  Psychosocial Re-Evaluation - 04/08/21 1722       Psychosocial Re-Evaluation   Current issues with None Identified    Comments Patient reports no issues with their current mental states, sleep, stress, depression or anxiety. Will follow up with patient in a few weeks for any changes.    Expected Outcomes Short: Continue to exercise regularly to support mental health and notify staff of any changes. Long: maintain mental health and well being through teaching of rehab or prescribed medications independently.    Interventions Encouraged to attend Cardiac Rehabilitation for the exercise    Continue Psychosocial Services  Follow up required by staff             Vocational Rehabilitation: Provide vocational rehab assistance to qualifying candidates.   Vocational Rehab Evaluation & Intervention:  Vocational Rehab - 01/16/21 1419  Initial Vocational Rehab Evaluation & Intervention   Assessment shows need for Vocational Rehabilitation No             Education: Education Goals: Education classes will be provided on a variety of topics geared toward better understanding of heart health and risk factor modification. Participant will state understanding/return demonstration of topics presented as noted by education test scores.  Learning Barriers/Preferences:  Learning Barriers/Preferences - 01/16/21 1419       Learning Barriers/Preferences   Learning Barriers None    Learning Preferences None             General Cardiac Education Topics:  AED/CPR: - Group verbal and written instruction with the use of models to demonstrate the basic use of the AED with the basic ABC's of resuscitation.   Anatomy and  Cardiac Procedures: - Group verbal and visual presentation and models provide information about basic cardiac anatomy and function. Reviews the testing methods done to diagnose heart disease and the outcomes of the test results. Describes the treatment choices: Medical Management, Angioplasty, or Coronary Bypass Surgery for treating various heart conditions including Myocardial Infarction, Angina, Valve Disease, and Cardiac Arrhythmias.  Written material given at graduation. Flowsheet Row Cardiac Rehab from 04/24/2021 in [facility] Cardiac and Pulmonary Rehab  Date 04/03/21  Educator [facility]  Instruction Review Code 1- Verbalizes Understanding       Medication Safety: - Group verbal and visual instruction to review commonly prescribed medications for heart and lung disease. Reviews the medication, class of the drug, and side effects. Includes the steps to properly store meds and maintain the prescription regimen.  Written material given at graduation. Flowsheet Row Cardiac Rehab from 04/24/2021 in [facility] Cardiac and Pulmonary Rehab  Date 04/17/21  Educator [facility]  Instruction Review Code 1- Verbalizes Understanding       Intimacy: - Group verbal instruction through game format to discuss how heart and lung disease can affect sexual intimacy. Written material given at graduation..   Know Your Numbers and Heart Failure: - Group verbal and visual instruction to discuss disease risk factors for cardiac and pulmonary disease and treatment options.  Reviews associated critical values for Overweight/Obesity, Hypertension, Cholesterol, and Diabetes.  Discusses basics of heart failure: signs/symptoms and treatments.  Introduces Heart Failure Zone chart for action plan for heart failure.  Written material given at graduation. Flowsheet Row Cardiac Rehab from 04/24/2021 in [facility] Cardiac and Pulmonary Rehab  Education need identified 01/31/21  Date 02/27/21  Educator [place]  Instruction Review Code 1- Verbalizes  Understanding       Infection Prevention: - Provides verbal and written material to individual with discussion of infection control including proper hand washing and proper equipment cleaning during exercise session. Flowsheet Row Cardiac Rehab from 04/24/2021 in [facility] Cardiac and Pulmonary Rehab  Date 01/31/21  Educator AS  Instruction Review Code 1- Verbalizes Understanding       Falls Prevention: - Provides verbal and written material to individual with discussion of falls prevention and safety. Flowsheet Row Cardiac Rehab from 04/24/2021 in [facility] Cardiac and Pulmonary Rehab  Date 01/31/21  Educator AS  Instruction Review Code 1- Verbalizes Understanding       Other: -Provides group and verbal instruction on various topics (see comments)   Knowledge Questionnaire Score:  Knowledge Questionnaire Score - 04/18/21 1709       Knowledge Questionnaire Score   Post Score 22/26             Core Components/Risk Factors/Patient Goals at Admission:  Personal Goals and Risk Factors at Admission - 01/31/21 1639       Core Components/Risk Factors/Patient Goals on Admission    Weight Management Yes    Intervention Weight Management/Obesity: Establish reasonable short term and long term weight goals.    Admit Weight 225 lb 6.4 oz (102.2 kg)    Goal Weight: Short Term 220 lb (99.8 kg)    Goal Weight: Long Term 215 lb (97.5 kg)    Expected Outcomes Short Term: Continue to assess and modify interventions until short term weight is achieved;Long Term: Adherence to nutrition and physical activity/exercise program aimed toward attainment of established weight goal;Weight Loss: Understanding of general recommendations for a balanced deficit meal plan, which promotes 1-2 lb weight loss per week and includes a negative energy balance of [phone] kcal/d    Lipids Yes    Intervention Provide education and support for participant on nutrition & aerobic/resistive exercise along with  prescribed medications to achieve LDL '70mg'$ , HDL >$Remo'40mg'OxiVR$ .    Expected Outcomes Short Term: Participant states understanding of desired cholesterol values and is compliant with medications prescribed. Participant is following exercise prescription and nutrition guidelines.;Long Term: Cholesterol controlled with medications as prescribed, with individualized exercise RX and with personalized nutrition plan. Value goals: LDL < $Rem'70mg'HdXi$ , HDL > 40 mg.             Education:Diabetes - Individual verbal and written instruction to review signs/symptoms of diabetes, desired ranges of glucose level fasting, after meals and with exercise. Acknowledge that pre and post exercise glucose checks will be done for 3 sessions at entry of program.   Core Components/Risk Factors/Patient Goals Review:   Goals and Risk Factor Review     Row Name 03/11/21 1737 03/11/21 1739 03/11/21 1745 04/08/21 1728       Core Components/Risk Factors/Patient Goals Review   Personal Goals Review Weight Management/Obesity -- -- Weight Management/Obesity    Review -- Jose Edwards's weight varies between 220-225.  He has been able to go down one belt notch.  He is taking medications as directed. He sees cardilogist tomorrow for follow up. Nachman wants to lose some weight and get in the lower 200 range. He has cut out fried foods in his diet. He is going to try to eat less and not over eat.    Expected Outcomes -- Short: continue to exercise consistently Long: reach goal weight -- Short: Lose 5 pounds in the next couple weeks.  Long: reach goal weight of lower 200's.             Core Components/Risk Factors/Patient Goals at Discharge (Final Review):   Goals and Risk Factor Review - 04/08/21 1728       Core Components/Risk Factors/Patient Goals Review   Personal Goals Review Weight Management/Obesity    Review Jose Edwards wants to lose some weight and get in the lower 200 range. He has cut out fried foods in his diet. He is going to try to eat  less and not over eat.    Expected Outcomes Short: Lose 5 pounds in the next couple weeks.  Long: reach goal weight of lower 200's.             ITP Comments:  ITP Comments     Row Name 01/16/21 1403 01/31/21 1642 02/04/21 1659 02/27/21 0635 03/11/21 1749   ITP Comments Initial telephone orientation completed. Diagnosis can be found in [facility] 8/29. EP orientation scheduled for Thursday 11/3 at 3pm. Completed 6MWT and gym orientation. Initial ITP created  and sent for review to Dr. Emily Filbert, Medical Director. First full day of exercise!  Patient was oriented to gym and equipment including functions, settings, policies, and procedures.  Patient's individual exercise prescription and treatment plan were reviewed.  All starting workloads were established based on the results of the 6 minute walk test done at initial orientation visit.  The plan for exercise progression was also introduced and progression will be customized based on patient's performance and goals. 30 Day review completed. Medical Director ITP review done, changes made as directed, and signed approval by Medical Director. Completed initial RD consultation    [place] Name 03/27/21 0657 04/24/21 0830 04/24/21 1631       ITP Comments 30 Day review completed. Medical Director ITP review done, changes made as directed, and signed approval by Medical Director. 30 Day review completed. Medical Director ITP review done, changes made as directed, and signed approval by Medical Director. Jose Edwards graduated today from  rehab with 36 sessions completed.  Details of the patient's exercise prescription and what He needs to do in order to continue the prescription and progress were discussed with patient.  Patient was given a copy of prescription and goals.  Patient verbalized understanding.  Jose Edwards plans to continue to exercise by going to the gym at work.              Comments: discharge ITP

## 2021-05-15 ENCOUNTER — Telehealth: Payer: Self-pay

## 2021-05-15 NOTE — Telephone Encounter (Signed)
This encounter was created in error - please disregard.

## 2021-05-15 NOTE — Telephone Encounter (Deleted)
° °  Pre-operative Risk Assessment    Patient Name: Jose Edwards  DOB: 06/29/1955 MRN: [id number]{ HEARTCARE STAFF-IMPORTANT INSTRUCTIONS 1 Red and Blue Text will auto delete once note is signed or closed. 2 Press F2 to navigate through template.   3 On drop down lists, L click to select >> R click to activate next field 4 Reason for Visit format is IMPORTANT!!  See Directions on No. 2 below. 5 Please review chart to determine if there is already a clearance note open for this procedure!!  DO NOT duplicate if a note already exists!!    :1}   {Is surgical clearance request for dental extraction/procedure?:[id number]}  Request for Surgical Clearance{ 1. What type of surgery is being performed? Enter name of procedure below and number of teeth if dental extraction.  :1}    Procedure:  Dental Extraction - Amount of Teeth to be Pulled:  full mouth  { 2. When is this surgery scheduled? Press F2 to enter date below and place date in Reason for Visit (see directions below). :1} Date of Surgery:  Clearance TBD                             { For convenience, highlight and copy (CTL+C) the Clearance MM/DD/YY phrase above. Click here to go to Reason for Visit.  Paste (CTL+V) the date.  Engineer, drilling.  Then click button underneath called Add Clearance MM/DD/YY as free text.     :1}  { 3. What is the name of the Surgeon, the Surgeon's Group or Practice, phone and fax number?  Press F2 and list below :1}  Surgeon:  Dr. Lorayne Bender  Surgeon's Group or Practice Name:  [facility]  Phone number:  [phone] Fax number:  [phone] { 4. What type of clearance is requested?  Medical or Cardiac Clearance only?  Pharmacy Clearance Only (Request is to hold medication only)?  Or Both?  Press F2 and select the clearance requested.  If both are needed, select both from the drop down list.     :1}  Type of Clearance Requested:   {Select Medical, Pharmacy or Both Medical AND  Pharmacy:[id number]} { 5. What type of anesthesia will be used?  Press F2 and select the anesthesia to be used for the procedure.  :1}  Type of Anesthesia:   4% Articaine 1:100,000 with Epinephrine  { 6. Are there any other requests or questions from the surgeon?    :1}  Additional requests/questions:  {Select additional requests/questions or use asterisks to free text (Optional):[id number]}  Signed, Dorris Fetch   05/15/2021, 3:45 PM

## 2021-05-15 NOTE — Telephone Encounter (Deleted)
Patient walked into the office on 05/14/21 with a form from [facility] with only his name Called the requesting office to get the needed information for the clearance.The representative took message and said that she will give the message to the

## 2021-05-15 NOTE — Telephone Encounter (Signed)
Patient walked into the office on 05/14/21 with a form from [facility] with only patient's name and DOB with a date of 05/07/21 written on the form. I was asked by Romie Levee, CMA if I could call and get the needed information because per Lorelle Gibbs the clinic supervisor that we do not accept the form like this and to call the office to ask them to fax the clearance to our office with the needed information.  I called the requesting office today 05/15/21 and spoke with a representative and informed her the reason for my call that the patient walked into our office with a form that did not have the needed information. She informed me that she would have to give the message to the dentist and have her call me back. I asked her if she knew the needed information and she said no that I would have to speak with the dentist.  I received a call back from Dr. Yetta Barre asking what is it that I am asking or to explain what it is that I need. I begin explaining what the needed information that was needed. She began speaking to me in a loud tone saying that she does not need to tell me anything and that all I need to do is give the form to the MD. When trying to inform her that we need to know the procedure if it is an extraction the number of teeth the type of anesthesia. Before I could speak anymore she injected by loudly speaking that I don't need to know the number of teeth and that the form should be given the MD which I am not and that if this patient does not have this procedure done that it will be my fault and she will report my license/certification. I at this point place the call on speaker phone so that my office mate Romie Levee, CMA could hear how she was speaking to me. I again tried to explain that we needed to know what exactly is being performed and/or the number of teeth being extracted.  She yelled "[name] I don't have to tell you anything about the number of teeth being extracted and  that is none of your business. That all I need to do is give the form to the MD and if the patient does not have this procedure and if it's because of me she will be speaking to my superior and will report me!"  I told her to please hold and when I went to press the hold button she hung up the telephone.

## 2021-05-16 NOTE — Telephone Encounter (Signed)
Left detailed message for Dr. Normand Sloop to discuss her concerns related to the line of questioning.

## 2021-05-22 ENCOUNTER — Telehealth: Payer: Self-pay

## 2021-05-22 NOTE — Telephone Encounter (Signed)
° °  Pre-operative Risk Assessment    Patient Name: Jose Edwards  DOB: 1955/10/06 MRN: [id number]     Request for Surgical Clearance{ 1. What type of surgery is being performed? Enter name of procedure below and number of teeth if dental extraction.  Tried calling Dental office. Patient was called and stated maybe 20 not sure of exact number.  Procedure:  Dental Extraction - Amount of Teeth to be Pulled:  Not listed  , Smooth bone  2. When is this surgery scheduled? Press F2 to enter date below and place date in Reason for Visit (see directions below). :1} Date of Surgery:  Clearance TBD                                 Surgeon:  Dr. Lorayne Bender Surgeon's Group or Practice Name:  [facility] Phone number:  [phone] Fax number:  [phone]   Type of Clearance Requested:   - Medical  - Pharmacy:  Hold Aspirin and Clopidogrel (Plavix)     Type of Anesthesia:   4% Articaine 1:100,000 with Epinephrine   Additional requests/questions:    Jose Edwards   05/22/2021, 4:43 PM

## 2021-05-22 NOTE — Telephone Encounter (Signed)
Returned patient's call from when he walked in the clearance form into the office on 05/14/21. Patient stated that he is having a full mouth extraction and that the date will be scheduled once the heart doctor clears him. When Jose Edwards was asked if he knows the number of teeth he stated he wasn't sure maybe 20 teeth that I would have to confirm with the dentist. Thanked patient for giving me the needed information and that I will send his clearance to the pool.

## 2021-05-23 ENCOUNTER — Other Ambulatory Visit: Payer: Self-pay

## 2021-05-24 NOTE — Telephone Encounter (Signed)
Jose Edwards 66 year old male is requesting preoperative cardiac evaluation for having 20 teeth extracted.  He was last seen in the clinic on March 12, 2021.  During that time he reported a little bit of exercise intolerance and fatigue.  However, he did not chest pain and pressure.  He denied orthopnea PND and lower extremity swelling.  He underwent cardiac catheterization 11/26/2020 with PCI and DES to his D1.  He was placed on aspirin and Plavix at that time.  His PMH includes ischemic cardiomyopathy EF 40-45%, hyperlipidemia, essential hypertension, obesity, and anterior STEMI.  May his aspirin and/or Plavix be held prior to his procedure?  Thank you for your help.  Please direct your response to CV DIV preop pool.  Thomasene Ripple. Pricila Bridge NP-C    05/24/2021, 11:21 AM [facility] [address] Office [phone] Fax [phone]

## 2021-05-27 NOTE — Telephone Encounter (Signed)
° °  Primary Cardiologist: Quay Burow, MD  Chart reviewed as part of pre-operative protocol coverage. Given past medical history and time since last visit, based on ACC/AHA guidelines, Jose Edwards would be at acceptable risk for the planned procedure without further cardiovascular testing.   His aspirin and Plavix may be held for 5 days prior to his procedure.  Please resume as soon as hemostasis is achieved.  Patient was advised that if he develops new symptoms prior to surgery to contact our office to arrange a follow-up appointment.  He verbalized understanding.  I will route this recommendation to the requesting party via Epic fax function and remove from pre-op pool.  Please call with questions.  Jossie Ng. Lesli Issa NP-C    05/27/2021, 8:37 AM [place] [address] Office [phone] Fax [phone]

## 2021-05-31 ENCOUNTER — Encounter: Payer: BC Managed Care – PPO | Admitting: Internal Medicine

## 2021-07-16 ENCOUNTER — Ambulatory Visit: Payer: BC Managed Care – PPO | Admitting: Cardiovascular Disease

## 2021-07-16 ENCOUNTER — Encounter: Payer: Self-pay | Admitting: Cardiology

## 2021-07-16 ENCOUNTER — Ambulatory Visit: Payer: BC Managed Care – PPO | Admitting: Cardiology

## 2021-07-16 VITALS — BP 124/76 | HR 78 | Ht 67.5 in | Wt 225.0 lb

## 2021-07-16 DIAGNOSIS — I255 Ischemic cardiomyopathy: Secondary | ICD-10-CM | POA: Diagnosis not present

## 2021-07-16 DIAGNOSIS — I7121 Aneurysm of the ascending aorta, without rupture: Secondary | ICD-10-CM

## 2021-07-16 DIAGNOSIS — I10 Essential (primary) hypertension: Secondary | ICD-10-CM | POA: Diagnosis not present

## 2021-07-16 DIAGNOSIS — Z9861 Coronary angioplasty status: Secondary | ICD-10-CM

## 2021-07-16 DIAGNOSIS — E669 Obesity, unspecified: Secondary | ICD-10-CM

## 2021-07-16 DIAGNOSIS — E785 Hyperlipidemia, unspecified: Secondary | ICD-10-CM | POA: Diagnosis not present

## 2021-07-16 DIAGNOSIS — I2109 ST elevation (STEMI) myocardial infarction involving other coronary artery of anterior wall: Secondary | ICD-10-CM | POA: Diagnosis not present

## 2021-07-16 DIAGNOSIS — I25119 Atherosclerotic heart disease of native coronary artery with unspecified angina pectoris: Secondary | ICD-10-CM

## 2021-07-16 DIAGNOSIS — I2510 Atherosclerotic heart disease of native coronary artery without angina pectoris: Secondary | ICD-10-CM

## 2021-07-16 NOTE — Patient Instructions (Addendum)
Medication Instructions:  ? No changes  ? ?*If you need a refill on your cardiac medications before your next appointment, please call your pharmacy* ? ? ?Lab Work:fasting ?CMP ?TSH ?HGBA1c ?Lipid ? ? ?If you have labs (blood work) drawn today and your tests are completely normal, you will receive your results only by: ?MyChart Message (if you have MyChart) OR ?A paper copy in the mail ?If you have any lab test that is abnormal or we need to change your treatment, we will call you to review the results. ? ? ?Testing/Procedures: ? ?Not needed  ? ?Follow-Up: ?At [facility], you and your health needs are our priority.  As part of our continuing mission to provide you with exceptional heart care, we have created designated Provider Care Teams.  These Care Teams include your primary Cardiologist (physician) and Advanced Practice Providers (APPs -  Physician Assistants and Nurse Practitioners) who all work together to provide you with the care you need, when you need it. ? ?  ? ?Your next appointment:   ?5 to 6 month(s) ? ?The format for your next appointment:   ?In Person ? ?Provider:   ?Jose Lemma, MD  ? ? ?Other Instructions  ? Clearance  for dental work  ?Okay to hold Plavix 5 days preop, preferably only hold aspirin for 3 days, but if necessary can hold for 5 days.  Restart 1 day postop ?

## 2021-07-16 NOTE — Progress Notes (Signed)
? ?Primary Care Provider: Venia Carbon, MD ?Cardiologist: Glenetta Hew, MD ?Electrophysiologist: None ? ?Clinic Note: ?Chief Complaint  ?Patient presents with  ? Follow-up  ?  CAD-no further angina after MI.  ? Cardiomyopathy  ?  Resolved on echo.  ? ?=================================== ? ?ASSESSMENT/PLAN  ? ?Problem List Items Addressed This Visit   ? ?  ? Cardiology Problems  ? 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  Now just about 8 months out from his MI with no residual angina or heart failure symptoms.  EF improved back to normal range after revascularization and medical therapy. ? ?He did well with cardiac rehab, but has not gotten out of the habit.  I encouraged him to continue exercise routine that he does not lose the ground that he has gained with the exercise. ? ?  ?  ? Relevant Orders  ? Lipid panel  ? Comprehensive metabolic panel  ? CBC  ? TSH  ? Hemoglobin A1c  ? 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  No chest pain or pressure with rest or exertion.  Needs to get back into exercising and walks his diet.  He is looking into joining a gym so he can actually get himself and in excess routine.  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 ? ?Plan: ?Continue with current dose of Toprol 50 mg daily along with losartan 50 mg daily, but will have him split to take Toprol in the evening and losartan in the morning, in order to help alleviate some of the dizziness. ?Continue high-dose statin for now, needs lipid recheck.  Also continue [place] ?Continue DAPT with aspirin and Plavix -> see PCI section for special consideration for his upcoming dental procedure ?He is on Prilosec, which technically should be converted to Protonix to avoid interaction with Plavix  ?  ?  ? Relevant Orders  ? Lipid panel  ? Comprehensive metabolic panel  ?  CBC  ? TSH  ? Hemoglobin A1c  ? CAD S/P DES PCI D1  (Chronic)  ?  Remains on DAPT with aspirin and Plavix without any significant bleeding issues. ? ?Recommendation: Continue 1 year uninterrupted DAPT through August 2023, with plan to then convert to "interrupted "Plavix monotherapy for 1 more year before restarting aspirin 81 mg and stopping Plavix.  ? ?However he does have a relatively urgent need to get his dentures completed.  The work is already been started prior to his MI, and he is worried about losing the existing casts.  As such, he probably needs this done relatively soon.  He is now almost 8 months out from his PCI of a small diagonal branch.  He is probably okay holding his antiplatelet agent to have the procedure done. ? ?For preop Dental Procedure purposes: ?Based on the need for procedure: ?I would agree to allow him to hold his antiplatelet agents for 5 days preop.  If possible, would like to at least have aspirin on board pill 3 days preop. ?Restart 1 day postop after it is clear that there is no further bleeding. ?-> I did explain that the dentist would need to send standard preop clearance paperwork to the office, and the request will be processed by our APP Preop Service -> this recommendation will be forwarded. ?  ?  ? Relevant Orders  ? Lipid panel  ?  Comprehensive metabolic panel  ? CBC  ? TSH  ? Hemoglobin A1c  ? Ischemic cardiomyopathy - EF 40-45% post Ant-Lat STEMI - Primary (Chronic)  ?  On medical management and post revascularization, his EF did improve back to normal level on follow-up echocardiogram. ? ?He denies any heart failure symptoms now.  Euvolemic on exam. ?He was started on Farxiga for HFrEF, will continue for now for potential residual diastolic dysfunction. ? ?  ?  ? Relevant Orders  ? Lipid panel  ? Comprehensive metabolic panel  ? CBC  ? TSH  ? Hemoglobin A1c  ? Hyperlipidemia with target LDL less than 70 (Chronic)  ?  Tolerating high-dose statin. ?Last labs checked were  in October, and LDL was at 70.  Would like it to be less than 55.  He is due for lab follow-up now.  (Was post to be checked prior to this visit). ? ?Plan: ?Continue current dose of atorvastatin ?Check fasting lipid panel, and also hemoglobin A1c for further risk stratification. ?  ?  ? Relevant Orders  ? Lipid panel  ? Comprehensive metabolic panel  ? CBC  ? TSH  ? Hemoglobin A1c  ? Essential hypertension (Chronic)  ?  Blood pressure stable, but he has had some dizzy spells since we titrated up the losartan dose. ? ?Plan: Continue current dose of losartan 50 mg and Toprol 50 mg,  ?In order to avoid dizziness: Change dosing to take losartan in the morning and Toprol in the evening ?Follow-up chemistry panel ?  ?  ? Relevant Orders  ? Lipid panel  ? Comprehensive metabolic panel  ? CBC  ? TSH  ? Hemoglobin A1c  ? Aneurysm of ascending aorta without rupture (HCC) (Chronic)  ?  Continue aggressive risk factor modification.   ? ?This was originally noted on echocardiogram.  Will probably need to reassess CT angiogram-aorta scan at his next follow-up visit. ? ?  ?  ?  ? Other  ? Obesity (BMI 30-39.9) (Chronic)  ?  Discussed importance of sticking with his diet and getting back and exercise.  He was doing very well at cardiac rehab, encouraged him to get back into the "routine". ? ?  ?  ? Relevant Orders  ? Lipid panel  ? Comprehensive metabolic panel  ? CBC  ? TSH  ? Hemoglobin A1c  ? ? ?=================================== ? ?HPI:   ? ?Jose Edwards is a 66 y.o. male with a PMH below who presents today for 57-month follow-up. ? 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 Chest & Back pain off and on => Anterolateral STEMI ->  CTA chest excluded thoracic aortic dissection (mild dilation)  ?Cath - PCI-D1. => EF 40-45%.  Mod HK of Apical Anterior wall. ? ?Jose Edwards was last seen on 03/12/2021: Other than  just getting over a cold, he was doing well.  Unfortunately he did not tolerate Entresto so we put him back on losartan in the previous visit.  Some mild exercise intolerance/fatigue, but no chest pain or pressure.  Was doing well with cardiac rehab.  Mild end of day swelling that goes down with elevation.  Occasional nosebleeds with blowing his nose.  No real angina or heart failure. ?Losartan increased to 50 mg daily ?Follow-up echocardiogram ordered ? ?Recent Hospitalizations: None ? ?Reviewed  CV studies:   ? ?The following studies were reviewed today: (if available, images/films reviewed: From Epic  Chart or Care Everywhere) ?Echo 04/02/2021:  EF 55-60%. No RWMA.  Normal Valves. ? ? ?Interval History:  ? ?Jose Edwards returns here today for routine follow-up doing pretty well.  Unfortunately, he notes that since stopping his cardiac rehab, he has gotten out of the habit of exercise.  He is got little bit deconditioned and is a little more short of breath with exertion than he had been.  She is less active.  He also has sort of fallen off the bandwagon when it comes to eating as well.  He wants to try to lose weight and get back into exercise routine. ? ?He has not had any chest pain or pressure with rest or exertion, no PND, orthopnea or edema.  The only thing he notes regarding standpoint is he drops his blood pressure on occasion.  He has had some lightheadedness and dizziness that is sometimes positional in nature.  No real palpitations or irregular heartbeats. ? ?He has a lot of issues with his teeth, has a chipped tooth and several teeth need to come out.  He desperately needs to finish the process of getting his dentures placed.  He will have to have several teeth pulled in order to get the dentures put in.  He is requesting cardiology clearance.-This would mean holding his antiplatelet agent earlier than the 1 year recommendation post PCI. ? ?REVIEWED OF SYSTEMS  ? ?Review of Systems   ?Constitutional:  Positive for malaise/fatigue (He has a lot of stuff to get deconditioned.  Less exercise since stopping cardiac rehab.  Also diet is worse.). Negative for weight loss.  ?HENT:  Negative for congestion

## 2021-07-21 ENCOUNTER — Encounter: Payer: Self-pay | Admitting: Cardiology

## 2021-07-21 DIAGNOSIS — I7121 Aneurysm of the ascending aorta, without rupture: Secondary | ICD-10-CM | POA: Insufficient documentation

## 2021-07-21 DIAGNOSIS — I77810 Thoracic aortic ectasia: Secondary | ICD-10-CM | POA: Insufficient documentation

## 2021-07-21 NOTE — Assessment & Plan Note (Signed)
Now just about 8 months out from his MI with no residual angina or heart failure symptoms.  EF improved back to normal range after revascularization and medical therapy. ? ?He did well with cardiac rehab, but has not gotten out of the habit.  I encouraged him to continue exercise routine that he does not lose the ground that he has gained with the exercise. ?

## 2021-07-21 NOTE — Assessment & Plan Note (Signed)
Continue aggressive risk factor modification.   ? ?This was originally noted on echocardiogram.  Will probably need to reassess CT angiogram-aorta scan at his next follow-up visit. ?

## 2021-07-21 NOTE — Assessment & Plan Note (Signed)
On medical management and post revascularization, his EF did improve back to normal level on follow-up echocardiogram. ? ?He denies any heart failure symptoms now.  Euvolemic on exam. ?He was started on Farxiga for HFrEF, will continue for now for potential residual diastolic dysfunction. ?

## 2021-07-21 NOTE — Assessment & Plan Note (Signed)
No chest pain or pressure with rest or exertion.  Needs to get back into exercising and walks his diet.  He is looking into joining a gym so he can actually get himself and in excess routine.  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 ? ?Plan: ?? Continue with current dose of Toprol 50 mg daily along with losartan 50 mg daily, but will have him split to take Toprol in the evening and losartan in the morning, in order to help alleviate some of the dizziness. ?? Continue high-dose statin for now, needs lipid recheck.  Also continue [place] ?? Continue DAPT with aspirin and Plavix -> see PCI section for special consideration for his upcoming dental procedure ?? He is on Prilosec, which technically should be converted to Protonix to avoid interaction with Plavix  ?

## 2021-07-21 NOTE — Assessment & Plan Note (Addendum)
Remains on DAPT with aspirin and Plavix without any significant bleeding issues. ? ?Recommendation: Continue 1 year uninterrupted DAPT through August 2023, with plan to then convert to "interrupted "Plavix monotherapy for 1 more year before restarting aspirin 81 mg and stopping Plavix.  ? ?However he does have a relatively urgent need to get his dentures completed.  The work is already been started prior to his MI, and he is worried about losing the existing casts.  As such, he probably needs this done relatively soon.  He is now almost 8 months out from his PCI of a small diagonal branch.  He is probably okay holding his antiplatelet agent to have the procedure done. ? ?For preop Dental Procedure purposes: ?? Based on the need for procedure: ?? I would agree to allow him to hold his antiplatelet agents for 5 days preop.  If possible, would like to at least have aspirin on board pill 3 days preop. ?? Restart 1 day postop after it is clear that there is no further bleeding. ?-> I did explain that the dentist would need to send standard preop clearance paperwork to the office, and the request will be processed by our APP Preop Service -> this recommendation will be forwarded. ?

## 2021-07-21 NOTE — Assessment & Plan Note (Addendum)
Blood pressure stable, but he has had some dizzy spells since we titrated up the losartan dose. ? ?Plan: Continue current dose of losartan 50 mg and Toprol 50 mg,  ?? In order to avoid dizziness: Change dosing to take losartan in the morning and Toprol in the evening ?? Follow-up chemistry panel ?

## 2021-07-21 NOTE — Assessment & Plan Note (Signed)
Discussed importance of sticking with his diet and getting back and exercise.  He was doing very well at cardiac rehab, encouraged him to get back into the "routine". ?

## 2021-07-21 NOTE — Assessment & Plan Note (Signed)
Tolerating high-dose statin. ?Last labs checked were in October, and LDL was at 70.  Would like it to be less than 55.  He is due for lab follow-up now.  (Was post to be checked prior to this visit). ? ?Plan: ?? Continue current dose of atorvastatin ?? Check fasting lipid panel, and also hemoglobin A1c for further risk stratification. ?

## 2021-11-19 ENCOUNTER — Other Ambulatory Visit: Payer: Self-pay | Admitting: Cardiovascular Disease

## 2021-12-09 ENCOUNTER — Ambulatory Visit: Payer: BC Managed Care – PPO | Attending: Cardiology | Admitting: Cardiology

## 2021-12-09 ENCOUNTER — Encounter: Payer: Self-pay | Admitting: Cardiology

## 2021-12-09 VITALS — BP 120/78 | HR 69 | Ht 67.0 in | Wt 211.2 lb

## 2021-12-09 DIAGNOSIS — E669 Obesity, unspecified: Secondary | ICD-10-CM

## 2021-12-09 DIAGNOSIS — I255 Ischemic cardiomyopathy: Secondary | ICD-10-CM

## 2021-12-09 DIAGNOSIS — I2109 ST elevation (STEMI) myocardial infarction involving other coronary artery of anterior wall: Secondary | ICD-10-CM | POA: Diagnosis not present

## 2021-12-09 DIAGNOSIS — Z9861 Coronary angioplasty status: Secondary | ICD-10-CM

## 2021-12-09 DIAGNOSIS — I7121 Aneurysm of the ascending aorta, without rupture: Secondary | ICD-10-CM

## 2021-12-09 DIAGNOSIS — I10 Essential (primary) hypertension: Secondary | ICD-10-CM

## 2021-12-09 DIAGNOSIS — I25119 Atherosclerotic heart disease of native coronary artery with unspecified angina pectoris: Secondary | ICD-10-CM | POA: Diagnosis not present

## 2021-12-09 DIAGNOSIS — E785 Hyperlipidemia, unspecified: Secondary | ICD-10-CM

## 2021-12-09 DIAGNOSIS — I2510 Atherosclerotic heart disease of native coronary artery without angina pectoris: Secondary | ICD-10-CM

## 2021-12-09 NOTE — Patient Instructions (Addendum)
Medication Instructions:  Stop  taking Aspirin    For your cramp at night - stretch your legs and take spoonful of pickle juice  *If you need a refill on your cardiac medications before your next appointment, please call your pharmacy*   Lab Work: CMP Lipid  ck   If you have labs (blood work) drawn today and your tests are completely normal, you will receive your results only by: MyChart Message (if you have MyChart) OR A paper copy in the mail If you have any lab test that is abnormal or we need to change your treatment, we will call you to review the results.   Testing/Procedures:  Not needed  Follow-Up: At [facility], you and your health needs are our priority.  As part of our continuing mission to provide you with exceptional heart care, we have created designated Provider Care Teams.  These Care Teams include your primary Cardiologist (physician) and Advanced Practice Providers (APPs -  Physician Assistants and Nurse Practitioners) who all work together to provide you with the care you need, when you need it.    Your next appointment:   6 month(s)  The format for your next appointment:   In Person  Provider:   Bryan Lemma, MD    Other Instructions

## 2021-12-09 NOTE — Progress Notes (Signed)
Primary Care Provider: Karie Schwalbe, MD [facility] cardiologist: Jose Lemma, MD Electrophysiologist: None  Clinic Note: Chief Complaint  Patient presents with   Follow-up    Doing well.  Has some mild right leg radicular pain and some nighttime cramps otherwise no complaints. Flutter is notably improved since changing beta-blocker to nighttime.   Coronary Artery Disease    No active angina or heart failure.   ===================================  ASSESSMENT/PLAN   Problem List Items Addressed This Visit       Cardiology Problems   CAD S/P DES PCI D1  (Chronic)    1 year out from PCI to D1.  He temporarily held his antiplatelet agents for dental procedure, I do not see that Plavix was restarted.    Ensure that he is actually taking 75 mg Plavix, would like to compete second year of Thienopyridine monotherapy.  Okay to hold for procedures. Okay to stop aspirin..      Coronary artery disease involving native coronary artery of native heart with angina pectoris (HCC) - Primary (Chronic)    No further angina with rest or exertion.  Back to exercising.  1 year out from PCI. Needed should he is indeed taking Plavix, this was possibly started after his dental procedure.  I do not see it listed, but he says he is taking.  Plan: Ensure that he is actually taking 75 mg Plavix, would like to compete second year of Thienopyridine monotherapy.  Okay to hold for procedures. Okay to stop aspirin. Continue current dose of Toprol, dose at night along with Cozaar in the morning. Remains on high-dose statin, continue to follow labs and if stable we can consider eventually backing back down to 40 mg from 80 mg atorvastatin. On Farxiga for cardiovascular benefit.      Ischemic cardiomyopathy - EF 40-45% post Ant-Lat STEMI (Chronic)    Resolved after PCI.  He is on stable regimen including stable dose of beta-blocker, ARB and Farxiga.  No CHF symptoms, therefore not on  diuretic.      Relevant Orders   EKG 12-Lead (Completed)   Lipid panel   Comprehensive metabolic panel   CK   ST elevation myocardial infarction (STEMI) of anterolateral wall (HCC) (Chronic)    Now just about 11 months post MI.  Doing well.  Back to normal range after initially being decreased.  No angina or heart failure symptoms. Stable medicine regimen-convert to Plavix monotherapy.      Relevant Orders   EKG 12-Lead (Completed)   Lipid panel   Comprehensive metabolic panel   CK   Essential hypertension (Chronic)    Excellent BP control on current dose of losartan and Toprol.  No change.      Hyperlipidemia with target LDL less than 70 (Chronic)    On high-dose statin (atorvastatin 80 mg).  We will order labs to be checked in the next week or so. Lipids, chemistry but also order CK because he is having some myalgias and nighttime cramps.  We will continue to monitor lipids, will hopefully potentially get a reducing down to 40 mg atorvastatin to avoid side effects, but for now he is doing okay.      Relevant Orders   EKG 12-Lead (Completed)   Lipid panel   Comprehensive metabolic panel   CK   Aneurysm of ascending aorta without rupture (HCC) (Chronic)    Truthfully, not an aneurysm.  This is just simply mild dilation of the thoracic aorta.  Continue aggressive risk  modification for cardiovascular disease.  We can reassess with a chest CTA-but would not necessarily be done until next year as 2-year out from MI visit.        Other   Obesity (BMI 30-39.9) (Chronic)    Great job with diet and exercise.  Good weight loss.  Continue to keep up the good work.      ===================================  HPI:    Jose Edwards is a 66 y.o. male with a PMH below who presents today for early 3991-month follow-up (as annual follow-up from MI).  Pertinent PMH  CAD-Anterior STEMI 11/26/2020 (D1 PCI) with resolved ICM,  11/26/2020 -Anterolateral STEMI. Chest & Back pain  off and on => Anterolateral STEMI ->  CTA chest excluded thoracic aortic dissection (mild dilation)  Cath - PCI-D1.11/26/2020 => EF 40-45%.  Mod HK of Apical Anterior wall.;   Echo: EF 40 to 45%, mild concentric LVH w/ GRII DD-mild atrial left dilation.,  Apical anterior and apical segment hypokinetic.  Remainder normal. Echo 03/28/2021:  EF 55-60%. No RWMA.  Normal Valves. 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  He was doing well.  Unfortunately he got out of the habit of doing exercise having stopped going to cardiac rehab after graduating.  Was also not really pain attention to his diet as well.  We will try to get back into exercising and dietary changes to lose weight.  He denies any recurrent angina or heart failure symptoms.  Just noted a couple times every now and then where his blood pressure will go low.  He was requesting cardiology clearance for having dental procedures -> having dentures placed.  Recent Hospitalizations: None  Reviewed  CV studies:    The following studies were reviewed today: (if available, images/films reviewed: From Epic Chart or Care Everywhere) None since follow-up Echo in December 22:  Interval History:   Jose Edwards returns here today overall doing pretty well now just over 1 year from his MI.  No further episodes of chest pain pressure or dyspnea with rest or exertion.  No real symptoms of CHF such as PND, orthopnea or edema.  He has chronic L>R lower leg spider veins and swollen varicose veins but no real edema or stasis changes. He has not had any further palpitations since we titrated up his beta-blocker and changed dose time to p.m. He has had some night cramps  CV Review of Symptoms (Summary): no chest pain or dyspnea on exertion positive for - edema and This is really trivial.  More swollen varicose veins.  No stasis changes.  Nighttime leg cramps negative  for - irregular heartbeat, orthopnea, palpitations, paroxysmal nocturnal dyspnea, rapid heart rate, shortness of breath, or Syncope/near syncope, TIA/RCVS, claudication  REVIEWED OF SYSTEMS   Review of Systems  Constitutional:  Positive for malaise/fatigue (Energy level is definitely getting better with increased activity, and weight loss) and weight loss (Dietary changes and exercise-14 pound loss since last visit).  HENT:  Negative for nosebleeds.   Respiratory:  Negative for cough and shortness of breath.   Cardiovascular:  Positive for leg swelling (ChronicL>R LLE swelling 1+ on the left and none on the right.  Also has some mild spider veins and engorged varicose veins but no significant stasis changes.).  Gastrointestinal:  Positive for heartburn. Negative for blood in stool and melena.       Occasional epigastric discomfort he describes  as a "hardening like golf ball. "  Associated with Gas, dyspepsia.   Genitourinary:  Negative for hematuria.  Musculoskeletal:  Positive for back pain, joint pain (Right hip) and myalgias (Night cramps).  Neurological:  Positive for tingling (Radicular pain down the right leg =R upper lef/ thigh radiates from his buttock to his knee.  Often related to different positions or activity.) and weakness (When his right leg hurts). Negative for dizziness.  Psychiatric/Behavioral:  Negative for depression and memory loss. The patient is not nervous/anxious and does not have insomnia.   All other systems reviewed and are negative.  I have reviewed and (if needed) personally updated the patient's problem list, medications, allergies, past medical and surgical history, social and family history.   PAST MEDICAL HISTORY   Past Medical History:  Diagnosis Date   Acute ST elevation myocardial infarction (STEMI) of anterolateral wall (HCC) 11/26/2020   100% D1 - DES PCI 2.25 x 16 (2.4 mm)   Benign positional vertigo    CAD S/P DES PCI D1  11/27/2020   100% D1 (DES PCI  with Onyx Frontier DES 2.25 x 18 - deployed to 2.4 mm)   Coronary artery disease involving native coronary artery of native heart with angina pectoris (HCC) 11/26/2020   8/29 (Ant-lat STEMI): Cath => Culprit lesion-100% D1 (DES PCI with Onyx Frontier DES 2.25 x 18 - deployed to 2.4 mm). Mid LM 30%, Ost-prox LAD 30% & RI 30%.     ED (erectile dysfunction)    GERD (gastroesophageal reflux disease)    Osteoarthrosis, unspecified whether generalized or localized, unspecified site     PAST SURGICAL HISTORY   Past Surgical History:  Procedure Laterality Date   ANKLE SURGERY  03/31/2006   fracture left ankle ( 2 pins)   CORONARY STENT INTERVENTION N/A 11/26/2020   Procedure: CORONARY STENT INTERVENTION;  Surgeon: Runell Gess, MD;  Location: [facility];  Service: Cardiovascular;  AntLat STEMI: (Culprit) D1 100% thrombotic. ->  DES PCI (Onyx Frontier DES 2.25 mm x 18 mm)   LEFT HEART CATH AND CORONARY ANGIOGRAPHY N/A 11/26/2020   Procedure: LEFT HEART CATH AND CORONARY ANGIOGRAPHY;  Surgeon: Runell Gess, MD;  Location: [facility];  Service: Cardiovascular; Antlat STEMI: Ostial-mid LM 30%.  RI 30%. ostial-proximal LAD 30%.  (Culprit) D1 100% thrombotic. ->  DES PCI   ROTATOR CUFF REPAIR  04/01/2003   right shoulder   TRANSTHORACIC ECHOCARDIOGRAM  01/03/Edwards   (Follow-up after medication adjustment from MI) EF 55-60%. No RWMA.  Normal Valves.   TRANSTHORACIC ECHOCARDIOGRAM  11/28/2018   (In setting of Anterolateral STEMI).  EF 40 to 45%.  Mildly decreased function.  Mild concentric LVH.  GRII DD.  Elevated LAP with moderate HK of apical anterior wall.  Mild LA dilation.  Mild AOV sclerosis-no stenosis.  Elevated RAP estimated 15 mmHg.  Normal RV size and function.   Cardiac Cath-PCI 11/26/2020: (Culprit) D1 100% thrombotic. ->  DES PCI (Onyx Frontier DES 2.25 mm x 18 mm); Ostial-mid LM 30%.  RI 30%. ostial-proximal LAD 30%.  (cath films personally reviewed)     Immunization  History  Administered Date(s) Administered   Influenza,inj,Quad PF,6+ Mos 01/11/2019, 03/01/2021   Influenza-Unspecified 12/22/2016, 01/19/2018   Td 03/31/2008    MEDICATIONS/ALLERGIES   Current Meds  Medication Sig   atorvastatin (LIPITOR) 80 MG tablet Take 1 tablet (80 mg total) by mouth daily.   clopidogrel (PLAVIX) 75 MG tablet Take 75 mg by mouth daily.  FARXIGA 10 MG TABS tablet TAKE 1 TABLET BY MOUTH DAILY BEFORE BREAKFAST   fluticasone (FLONASE) 50 MCG/ACT nasal spray Place 2 sprays into both nostrils daily.   losartan (COZAAR) 50 MG tablet Take 1 tablet (50 mg total) by mouth daily.   metoprolol succinate (TOPROL-XL) 50 MG 24 hr tablet TAKE 1 TABLET BY MOUTH DAILY WITH OR IMMEDIATELY FOLLOWING A MEAL   nitroGLYCERIN (NITROSTAT) 0.4 MG SL tablet Place 1 tablet (0.4 mg total) under the tongue every 5 (five) minutes as needed for chest pain.   omeprazole (PRILOSEC OTC) 20 MG tablet Take 20 mg by mouth daily as needed (for heartburn).   vitamin B-12 (CYANOCOBALAMIN) 100 MCG tablet Take 100 mcg by mouth every other day.    vitamin C (ASCORBIC ACID) 500 MG tablet Take 500-1,000 mg by mouth daily.   [DISCONTINUED] aspirin 81 MG EC tablet Take 1 tablet (81 mg total) by mouth daily. Swallow whole.  He is taking Plavix 75 mg daily  Allergies  Allergen Reactions   Gabapentin Other (See Comments)    Causes aggressive feelings   Pseudoephedrine Other (See Comments)    Makes the patient drowsy    SOCIAL HISTORY/FAMILY HISTORY   Reviewed in Epic:  Pertinent findings:  Social History   Tobacco Use   Smoking status: Never   Smokeless tobacco: Never  Vaping Use   Vaping Use: Never used  Substance Use Topics   Alcohol use: Yes    Comment: Beer socially (once weekly)   Drug use: No   Social History   Social History Narrative   Lives with wife in a one story home.  Has 3 sons.  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  Education: high school.    OBJCTIVE -PE, EKG,  labs   Wt Readings from Last 3 Encounters:  12/09/21 211 lb 3.2 oz (95.8 kg)  07/16/21 225 lb (102.1 kg)  04/10/21 227 lb 8 oz (103.2 kg)    Physical Exam: BP 120/78   Pulse 69   Ht 5\' 7"  (1.702 m)   Wt 211 lb 3.2 oz (95.8 kg)   SpO2 98%   BMI 33.08 kg/m  Physical Exam Vitals reviewed.  Constitutional:      General: He is not in acute distress.    Appearance: Normal appearance. He is obese. He is not ill-appearing (Healthy-appearing other than obese.  Well-nourished and well-groomed.  Weight is definitely down from last visit.) or toxic-appearing.  HENT:     Head: Normocephalic and atraumatic.  Neck:     Vascular: No carotid bruit.  Cardiovascular:     Rate and Rhythm: Normal rate and regular rhythm.     Pulses: Normal pulses.     Heart sounds: Murmur (1/6 SEM at RUSB-neck.) heard.     No friction rub. No gallop.  Pulmonary:     Effort: Pulmonary effort is normal. No respiratory distress.     Breath sounds: Normal breath sounds. No wheezing, rhonchi or rales (Not really rales, just diminished base sounds in the bases.).  Musculoskeletal:     Cervical back: Normal range of motion and neck supple.  Skin:    General: Skin is warm and dry.     Findings: Bruising (On anticoagulant -> mild bruising) present.  Neurological:     General: No focal deficit present.     Mental Status: He is alert. Mental status is at baseline. He is disoriented.     Motor: No weakness.     Gait: Gait abnormal.  Psychiatric:        Mood and Affect: Mood normal.        Behavior: Behavior normal.        Thought Content: Thought content normal.        Judgment: Judgment normal.     Adult ECG Report  Rate: 69 ;  Rhythm: normal sinus rhythm and Septal MI, age-indeterminate.  Otherwise normal axis, intervals and durations. ;   Narrative Interpretation: Stable  Recent Labs: Reviewed.  Due for labs to be checked soon by PCP.    ================================================== I spent a total  of 30 min with the patient spent in direct patient consultation.  Additional time spent with chart review  / charting (studies, outside notes, etc): 16 min Total Time: 46 min  Current medicines are reviewed at length with the patient today.  (+/- concerns) none  Notice: This dictation was prepared with Dragon dictation along with smart phrase technology. Any transcriptional errors that result from this process are unintentional and may not be corrected upon review.  Studies Ordered:   Orders Placed This Encounter  Procedures   Lipid panel   Comprehensive metabolic panel   CK   EKG 12-Lead   No orders of the defined types were placed in this encounter.   Patient Instructions / Medication Changes & Studies & Tests Ordered   Patient Instructions  Medication Instructions:  Stop  taking Aspirin    For your cramp at night - stretch your legs and take spoonful of pickle juice  *If you need a refill on your cardiac medications before your next appointment, please call your pharmacy*   Lab Work: CMP Lipid  ck   If you have labs (blood work) drawn today and your tests are completely normal, you will receive your results only by: MyChart Message (if you have MyChart) OR A paper copy in the mail If you have any lab test that is abnormal or we need to change your treatment, we will call you to review the results.   Testing/Procedures:  Not needed  Follow-Up: At [facility], you and your health needs are our priority.  As part of our continuing mission to provide you with exceptional heart care, we have created designated Provider Care Teams.  These Care Teams include your primary Cardiologist (physician) and Advanced Practice Providers (APPs -  Physician Assistants and Nurse Practitioners) who all work together to provide you with the care you need, when you need it.    Your next appointment:   6 month(s)  The format for your next appointment:   In Person  Provider:    Bryan Lemma, MD    Other Instructions      Marykay Lex, MD, MS Jose Edwards, M.D., M.S. Interventional Cardiologist  [facility]  Pager # [phone] Phone # [phone] [address] [place], [place] [address]   Thank you for choosing [facility] [place]!!

## 2021-12-13 ENCOUNTER — Other Ambulatory Visit: Payer: Self-pay

## 2021-12-13 DIAGNOSIS — I255 Ischemic cardiomyopathy: Secondary | ICD-10-CM

## 2021-12-13 DIAGNOSIS — I2109 ST elevation (STEMI) myocardial infarction involving other coronary artery of anterior wall: Secondary | ICD-10-CM

## 2021-12-13 DIAGNOSIS — E7849 Other hyperlipidemia: Secondary | ICD-10-CM

## 2021-12-15 LAB — LIPID PANEL
Chol/HDL Ratio: 2.7 ratio (ref 0.0–5.0)
Cholesterol, Total: 126 mg/dL (ref 100–199)
HDL: 46 mg/dL (ref >39)
LDL Chol Calc (NIH): 67 mg/dL (ref 0–99)
Triglycerides: 63 mg/dL (ref 0–149)
VLDL Cholesterol Cal: 13 mg/dL (ref 5–40)

## 2021-12-15 LAB — COMPREHENSIVE METABOLIC PANEL
ALT: 24 IU/L (ref 0–44)
AST: 25 IU/L (ref 0–40)
Albumin/Globulin Ratio: 2 (ref 1.2–2.2)
Albumin: 4.5 g/dL (ref 3.9–4.9)
Alkaline Phosphatase: 90 IU/L (ref 44–121)
BUN/Creatinine Ratio: 11 (ref 10–24)
BUN: 11 mg/dL (ref 8–27)
Bilirubin Total: 0.8 mg/dL (ref 0.0–1.2)
CO2: 23 mmol/L (ref 20–29)
Calcium: 9.6 mg/dL (ref 8.6–10.2)
Chloride: 105 mmol/L (ref 96–106)
Creatinine, Ser: 0.99 mg/dL (ref 0.76–1.27)
Globulin, Total: 2.2 g/dL (ref 1.5–4.5)
Glucose: 111 mg/dL — ABNORMAL HIGH (ref 70–99)
Potassium: 4.4 mmol/L (ref 3.5–5.2)
Sodium: 142 mmol/L (ref 134–144)
Total Protein: 6.7 g/dL (ref 6.0–8.5)
eGFR: 83 mL/min/{1.73_m2} (ref >59)

## 2021-12-15 LAB — CK: Total CK: 131 U/L (ref 41–331)

## 2021-12-22 ENCOUNTER — Encounter: Payer: Self-pay | Admitting: Cardiology

## 2021-12-22 NOTE — Assessment & Plan Note (Signed)
Excellent BP control on current dose of losartan and Toprol.  No change.

## 2021-12-22 NOTE — Assessment & Plan Note (Addendum)
On high-dose statin (atorvastatin 80 mg).  We will order labs to be checked in the next week or so. Lipids, chemistry but also order CK because he is having some myalgias and nighttime cramps.  We will continue to monitor lipids, will hopefully potentially get a reducing down to 40 mg atorvastatin to avoid side effects, but for now he is doing okay.

## 2021-12-22 NOTE — Assessment & Plan Note (Signed)
Now just about 11 months post MI.  Doing well.  Back to normal range after initially being decreased.  No angina or heart failure symptoms. Stable medicine regimen-convert to Plavix monotherapy.

## 2021-12-22 NOTE — Assessment & Plan Note (Signed)
Great job with diet and exercise.  Good weight loss.  Continue to keep up the good work.

## 2021-12-22 NOTE — Assessment & Plan Note (Signed)
1 year out from PCI to D1.  He temporarily held his antiplatelet agents for dental procedure, I do not see that Plavix was restarted.     Ensure that he is actually taking 75 mg Plavix, would like to compete second year of Thienopyridine monotherapy.  Okay to hold for procedures. Okay to stop aspirin.Marland Kitchen

## 2021-12-22 NOTE — Assessment & Plan Note (Signed)
Resolved after PCI.  He is on stable regimen including stable dose of beta-blocker, ARB and Farxiga.  No CHF symptoms, therefore not on diuretic.

## 2021-12-22 NOTE — Assessment & Plan Note (Signed)
Truthfully, not an aneurysm.  This is just simply mild dilation of the thoracic aorta.  Continue aggressive risk modification for cardiovascular disease.  We can reassess with a chest CTA-but would not necessarily be done until next year as 2-year out from MI visit.

## 2021-12-22 NOTE — Assessment & Plan Note (Addendum)
No further angina with rest or exertion.  Back to exercising.  1 year out from PCI. Needed should he is indeed taking Plavix, this was possibly started after his dental procedure.  I do not see it listed, but he says he is taking.  Plan:  Ensure that he is actually taking 75 mg Plavix, would like to compete second year of Thienopyridine monotherapy.  Okay to hold for procedures.  Okay to stop aspirin.  Continue current dose of Toprol, dose at night along with Cozaar in the morning.  Remains on high-dose statin, continue to follow labs and if stable we can consider eventually backing back down to 40 mg from 80 mg atorvastatin.  On Farxiga for cardiovascular benefit.

## 2021-12-23 ENCOUNTER — Other Ambulatory Visit: Payer: Self-pay

## 2021-12-23 MED ORDER — ATORVASTATIN CALCIUM 80 MG PO TABS: 80.0000 mg | ORAL_TABLET | Freq: Every day | ORAL | 1 refills | Status: DC

## 2022-01-09 ENCOUNTER — Other Ambulatory Visit: Payer: Self-pay | Admitting: Cardiology

## 2022-02-03 ENCOUNTER — Other Ambulatory Visit: Payer: Self-pay | Admitting: Cardiovascular Disease

## 2022-03-26 ENCOUNTER — Other Ambulatory Visit: Payer: Self-pay | Admitting: Cardiovascular Disease

## 2022-06-02 ENCOUNTER — Ambulatory Visit: Payer: BC Managed Care – PPO | Admitting: Cardiology

## 2022-07-25 ENCOUNTER — Ambulatory Visit: Payer: BC Managed Care – PPO | Attending: Cardiology | Admitting: Cardiology

## 2022-07-25 ENCOUNTER — Encounter: Payer: Self-pay | Admitting: Cardiology

## 2022-07-25 VITALS — BP 134/74 | HR 63 | Ht 67.5 in | Wt 214.0 lb

## 2022-07-25 DIAGNOSIS — I2510 Atherosclerotic heart disease of native coronary artery without angina pectoris: Secondary | ICD-10-CM | POA: Diagnosis not present

## 2022-07-25 DIAGNOSIS — I2109 ST elevation (STEMI) myocardial infarction involving other coronary artery of anterior wall: Secondary | ICD-10-CM | POA: Diagnosis not present

## 2022-07-25 DIAGNOSIS — I7121 Aneurysm of the ascending aorta, without rupture: Secondary | ICD-10-CM | POA: Diagnosis not present

## 2022-07-25 DIAGNOSIS — E785 Hyperlipidemia, unspecified: Secondary | ICD-10-CM

## 2022-07-25 DIAGNOSIS — E669 Obesity, unspecified: Secondary | ICD-10-CM

## 2022-07-25 DIAGNOSIS — Z9861 Coronary angioplasty status: Secondary | ICD-10-CM

## 2022-07-25 DIAGNOSIS — I255 Ischemic cardiomyopathy: Secondary | ICD-10-CM

## 2022-07-25 DIAGNOSIS — I25119 Atherosclerotic heart disease of native coronary artery with unspecified angina pectoris: Secondary | ICD-10-CM | POA: Diagnosis not present

## 2022-07-25 DIAGNOSIS — G4762 Sleep related leg cramps: Secondary | ICD-10-CM | POA: Insufficient documentation

## 2022-07-25 DIAGNOSIS — I10 Essential (primary) hypertension: Secondary | ICD-10-CM

## 2022-07-25 NOTE — Progress Notes (Signed)
Primary Care Provider: Karie Schwalbe, MD [facility] Cardiologist: Bryan Lemma, MD Electrophysiologist: None  Clinic Note: Chief Complaint  Patient presents with   Follow-up    6 months.  Doing well.  Unfortunately gained back some weight.  Had been down to 203 pounds.   Coronary Artery Disease    No angina.  Energy level back to normal.    ===================================  ASSESSMENT/PLAN   Problem List Items Addressed This Visit       Cardiology Problems   ST elevation myocardial infarction (STEMI) of anterolateral wall (HCC) (Chronic)    18 months out from MI with single-vessel PCI.  Getting back to baseline level activity.  On stable regimen.  No active angina or heart failure symptoms.  Plan is complete 24 months of Plavix as of the end of August.  Then switch back to aspirin.      Relevant Orders   Lipid panel   Comprehensive metabolic panel   Hemoglobin A1c   Ischemic cardiomyopathy - EF 40-45% post Ant-Lat STEMI (Chronic)    Resolved after PCI and titration of GDMT medications. No active CHF symptoms.  Euvolemic on exam.  Continue current dose of losartan, metoprolol and Farxiga.       Relevant Orders   Lipid panel   Comprehensive metabolic panel   Hemoglobin A1c   CT ANGIO ABDOMEN PELVIS  W & WO CONTRAST   Hyperlipidemia with target LDL less than 70 (Chronic)    On high-dose atorvastatin 80 mg daily.  Most recent lipids showed LDL of 67 in September 2023.  Target is less than 55.  Due to recheck labs -if not checked prior to 58-month follow-up is to be done prior to that visit.      Relevant Orders   Lipid panel   Comprehensive metabolic panel   Hemoglobin A1c   Essential hypertension (Chronic)    Well-controlled BP on current dose of losartan and Toprol.  If pressures do increase, would increase losartan to 100 mg daily.      Relevant Orders   Lipid panel   Comprehensive metabolic panel   Hemoglobin A1c   Coronary artery  disease involving native coronary artery of native heart with angina pectoris (HCC) - Primary (Chronic)    No further angina since PCI.  Back exercising and doing work around the house.  No major issues.  Plan: On 80 mg atorvastatin-will check labs now. On moderate dose Toprol 50 mg daily which is very beneficial for his palpitations as well On losartan 50 mg daily for afterload reduction -> would titrate up next if necessary for blood pressure control. On Farxiga On Plavix monotherapy through the end of August 2024 -> okay to hold for procedures. As of November 30, 2022, plan will be to convert to 81 mg aspirin and discontinue Plavix.      Relevant Orders   Lipid panel   Comprehensive metabolic panel   Hemoglobin A1c   CT ANGIO ABDOMEN PELVIS  W & WO CONTRAST   CAD S/P DES PCI D1  (Chronic)    18 months out from MI with PCI to the diagonal branch. Now on Plavix monotherapy with plans to complete year 2.  He will be seen in 5 to 6 months at which point we will stop Plavix and go back to aspirin 81 mg.  (Roughly September 1) Okay to hold Plavix 5 to 7 days preop for surgical procedures.      Relevant Orders   Lipid panel  Comprehensive metabolic panel   Hemoglobin A1c   Aneurysm of ascending aorta without rupture (HCC) (Chronic)    Dilated/ectatic thoracic aorta. Plan was to reassess with CTA prior to 8-month follow-up.      Relevant Orders   Lipid panel   Comprehensive metabolic panel   Hemoglobin A1c   CT ANGIO ABDOMEN PELVIS  W & WO CONTRAST     Other   Obesity (BMI 30-39.9) (Chronic)    He had done really well with weight loss, but then got out of the habit of dietary modification.  We again stressed the importance of maintaining healthy diet-Mediterranean style/DASHplant-based diet and increased exercise.  Avoid excess sweets and grazing.      Relevant Orders   Lipid panel   Comprehensive metabolic panel   Hemoglobin A1c   Leg cramps, sleep related     Recommend wearing support stockings light to medium weight. Elevate feet when possible. Continue to do pedaling type exercises to help mobilize blood flow.      Relevant Orders   Lipid panel   Comprehensive metabolic panel   Hemoglobin A1c    ===================================  HPI:    Jose Edwards is a 67 y.o. male with a PMH below who presents today for 16-month follow-up. Referring Provider: Karie Schwalbe, MD.  Pertinent PMH  CAD-Anterior STEMI 11/26/2020 (D1 PCI) with resolved ICM,  11/26/2020 -Anterolateral STEMI. Chest & Back pain off and on => Anterolateral STEMI ->  CTA chest excluded thoracic aortic dissection (mild dilation)  Cath - PCI-D1.11/26/2020 => EF 40-45%.  Mod HK of Apical Anterior wall.;   Echo: EF 40 to 45%, mild concentric LVH w/ GRII DD-mild atrial left dilation.,  Apical anterior and apical segment hypokinetic.  Remainder normal. Echo 03/28/2021:  EF 55-60%. No RWMA.  Normal Valves. => Resolved ICM Dilated Thoracic Aorta (4.1 cm) CRFs: HTN, HLD   Jose Edwards was last seen on December 09, 2021 and doing well 1 year out from his MI.  No further episodes of chest pain or pressure at rest or exertion.  No CHF symptoms.  Chronic left greater than right leg swelling with spider veins and varicose veins.  No palpitations since titrating up beta-blocker.  Some night cramps mostly in the left leg.  Energy level improving with increased levels of activity and exercise.  Had lost about 14 pounds.  Recent Hospitalizations: None  Reviewed  CV studies:    The following studies were reviewed today: (if available, images/films reviewed: From Epic Chart or Care Everywhere) None:  Interval History:   Jose Edwards returns here today for 27-month follow-up overall doing pretty well.  He does exercise walking around the house, doing housework and yard work.  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  He also does  work in his shop.  He goes up and down ladders etc.  He also goes up and down multiple different life steps all of which he does without any major issues of chest pain or pressure.  No dyspnea unless he really pushes it.  He is too busy with all this type activity to do full exercise but he is hopeful he can get back into doing more exercise.  He said he may get tired at the end of the day when he goes all 3 lawns and has to stop every now and then for a break.  Had dropped down to 201 - but over last 1-2 months gained back to ~  210. - Wants to get back into exercise.   CV Review of Symptoms (Summary): no chest pain or dyspnea on exertion positive for - edema and mostly related to his left leg has had injury in the past in the ankle.  He has varicose veins and spider veins with swelling. negative for - dyspnea on exertion, irregular heartbeat, orthopnea, palpitations, paroxysmal nocturnal dyspnea, rapid heart rate, shortness of breath, or syncope/near syncope, TIA/amaurosis fugax, claudication. Melena, hematochezia, hematuria, epistaxis  Neuro nighttime cramping/leg aching.  REVIEWED OF SYSTEMS   Review of Systems  Constitutional:  Negative for weight loss (Unfortunately, he gained weight back.).  HENT:  Negative for congestion.   Respiratory:  Negative for cough and shortness of breath.   Cardiovascular:  Positive for leg swelling (Chronic LLE?RLE - h/x of ankle Fxr; varicose veins/ spider veins.).  Gastrointestinal:  Positive for heartburn. Negative for blood in stool and melena.  Genitourinary:  Negative for hematuria.  Musculoskeletal:  Positive for back pain (Improved sciatic nerve radiculopathy, but still has it every now and then.  Radiates down the buttock to his knee.) and joint pain (Left ankle, both hips).       Nighttime leg cramps & aching  Neurological:  Negative for dizziness and focal weakness.  Endo/Heme/Allergies:  Does not bruise/bleed easily.  Psychiatric/Behavioral:   Negative for depression and memory loss. The patient is not nervous/anxious and does not have insomnia.    I have reviewed and (if needed) personally updated the patient's problem list, medications, allergies, past medical and surgical history, social and family history.   PAST MEDICAL HISTORY   Past Medical History:  Diagnosis Date   Acute ST elevation myocardial infarction (STEMI) of anterolateral wall (HCC) 11/26/2020   100% D1 - DES PCI 2.25 x 16 (2.4 mm)   Benign positional vertigo    CAD S/P DES PCI D1  11/27/2020   100% D1 (DES PCI with Onyx Frontier DES 2.25 x 18 - deployed to 2.4 mm)   Coronary artery disease involving native coronary artery of native heart with angina pectoris (HCC) 11/26/2020   8/29 (Ant-lat STEMI): Cath => Culprit lesion-100% D1 (DES PCI with Onyx Frontier DES 2.25 x 18 - deployed to 2.4 mm). Mid LM 30%, Ost-prox LAD 30% & RI 30%.     ED (erectile dysfunction)    GERD (gastroesophageal reflux disease)    Osteoarthrosis, unspecified whether generalized or localized, unspecified site     PAST SURGICAL HISTORY   Past Surgical History:  Procedure Laterality Date   ANKLE SURGERY  03/31/2006   fracture left ankle ( 2 pins)   CORONARY STENT INTERVENTION N/A 11/26/2020   Procedure: CORONARY STENT INTERVENTION;  Surgeon: Runell Gess, MD;  Location: [facility];  Service: Cardiovascular;  AntLat STEMI: (Culprit) D1 100% thrombotic. ->  DES PCI (Onyx Frontier DES 2.25 mm x 18 mm)   LEFT HEART CATH AND CORONARY ANGIOGRAPHY N/A 11/26/2020   Procedure: LEFT HEART CATH AND CORONARY ANGIOGRAPHY;  Surgeon: Runell Gess, MD;  Location: [facility];  Service: Cardiovascular; Antlat STEMI: Ostial-mid LM 30%.  RI 30%. ostial-proximal LAD 30%.  (Culprit) D1 100% thrombotic. ->  DES PCI   ROTATOR CUFF REPAIR  04/01/2003   right shoulder   TRANSTHORACIC ECHOCARDIOGRAM  04/02/2021   (Follow-up after medication adjustment from MI) EF 55-60%. No RWMA.  Normal  Valves.   TRANSTHORACIC ECHOCARDIOGRAM  11/28/2018   (In setting of Anterolateral STEMI).  EF 40 to 45%.  Mildly decreased  function.  Mild concentric LVH.  GRII DD.  Elevated LAP with moderate HK of apical anterior wall.  Mild LA dilation.  Mild AOV sclerosis-no stenosis.  Elevated RAP estimated 15 mmHg.  Normal RV size and function.   Cardiac Cath-PCI 11/26/2020: (Culprit) D1 100% thrombotic. ->  DES PCI (Onyx Frontier DES 2.25 mm x 18 mm); Ostial-mid LM 30%.  RI 30%. ostial-proximal LAD 30%.  (cath films personally reviewed)      MEDICATIONS/ALLERGIES   Current Meds  Medication Sig   atorvastatin (LIPITOR) 80 MG tablet TAKE ONE TABLET EVERY DAY   clopidogrel (PLAVIX) 75 MG tablet TAKE ONE TABLET (75 MG) BY MOUTH EVERY DAY   FARXIGA 10 MG TABS tablet TAKE 1 TABLET BY MOUTH DAILY BEFORE BREAKFAST   fluticasone (FLONASE) 50 MCG/ACT nasal spray Place 2 sprays into both nostrils daily.   losartan (COZAAR) 50 MG tablet TAKE ONE TABLET (50 MG) BY MOUTH EVERY DAY - DISCONTINUE 25 MG DOSE.   metoprolol succinate (TOPROL-XL) 50 MG 24 hr tablet TAKE 1 TABLET BY MOUTH DAILY WITH OR IMMEDIATELY FOLLOWING A MEAL   nitroGLYCERIN (NITROSTAT) 0.4 MG SL tablet Place 1 tablet (0.4 mg total) under the tongue every 5 (five) minutes as needed for chest pain.   omeprazole (PRILOSEC OTC) 20 MG tablet Take 20 mg by mouth daily as needed (for heartburn).   vitamin B-12 (CYANOCOBALAMIN) 100 MCG tablet Take 100 mcg by mouth every other day.     Allergies  Allergen Reactions   Gabapentin Other (See Comments)    Causes aggressive feelings   Pseudoephedrine Other (See Comments)    Makes the patient drowsy    SOCIAL HISTORY/FAMILY HISTORY   Reviewed in Epic:  Pertinent findings:  Social History   Tobacco Use   Smoking status: Never   Smokeless tobacco: Never  Vaping Use   Vaping Use: Never used  Substance Use Topics   Alcohol use: Yes    Comment: Beer socially (once weekly)   Drug use: No   Social  History   Social History Narrative   Lives with wife in a one story home.  Has 3 sons.  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  Education: high school.    OBJCTIVE -PE, EKG, labs   Wt Readings from Last 3 Encounters:  07/25/22 214 lb (97.1 kg)  12/09/21 211 lb 3.2 oz (95.8 kg)  07/16/21 225 lb (102.1 kg)    Physical Exam: BP 134/74   Pulse 63   Ht 5' 7.5" (1.715 m)   Wt 214 lb (97.1 kg)   SpO2 98%   BMI 33.02 kg/m  Physical Exam Vitals reviewed.  Constitutional:      General: He is not in acute distress.    Appearance: Normal appearance. He is obese. He is not ill-appearing or toxic-appearing.  HENT:     Head: Normocephalic and atraumatic.  Neck:     Vascular: No carotid bruit or JVD.  Cardiovascular:     Rate and Rhythm: Normal rate and regular rhythm. No extrasystoles are present.    Chest Wall: PMI is not displaced.     Pulses: Normal pulses.     Heart sounds: S1 normal and S2 normal. Murmur (1/6 SEM @ RUSB-neck) heard.     No friction rub. No gallop.  Pulmonary:     Effort: Pulmonary effort is normal. No respiratory distress.     Breath sounds: Normal breath sounds. No wheezing, rhonchi or rales.  Musculoskeletal:     Cervical back:  Normal range of motion and neck supple.     Right lower leg: Edema (trace) present.     Left lower leg: Edema (~1+) present.  Skin:    General: Skin is warm and dry.     Findings: Bruising present.     Comments: Mild LLE Venous stasis changes -> spider veins & Varicose veins.  Neurological:     General: No focal deficit present.     Mental Status: He is alert and oriented to person, place, and time.     Gait: Gait abnormal.  Psychiatric:        Mood and Affect: Mood normal.        Behavior: Behavior normal.        Thought Content: Thought content normal.        Judgment: Judgment normal.     Adult ECG Report N/a  Recent Labs: Due for labs to be checked. Lab Results  Component Value Date   CHOL 126 12/13/2021    HDL 46 12/13/2021   LDLCALC 67 12/13/2021   LDLDIRECT 155.1 02/27/2010   TRIG 63 12/13/2021   CHOLHDL 2.7 12/13/2021   Lab Results  Component Value Date   CREATININE 0.99 12/13/2021   BUN 11 12/13/2021   NA 142 12/13/2021   K 4.4 12/13/2021   CL 105 12/13/2021   CO2 23 12/13/2021   ================================================== I spent a total of 31 minutes with the patient spent in direct patient consultation.  Additional time spent with chart review  / charting (studies, outside notes, etc): 14 min Total Time: 45 min  Current medicines are reviewed at length with the patient today.  (+/- concerns) n/a  Notice: This dictation was prepared with Dragon dictation along with smart phrase technology. Any transcriptional errors that result from this process are unintentional and may not be corrected upon review.  Studies Ordered:   Orders Placed This Encounter  Procedures   CT ANGIO ABDOMEN PELVIS  W & WO CONTRAST   Lipid panel   Comprehensive metabolic panel   Hemoglobin A1c   No orders of the defined types were placed in this encounter.   Patient Instructions / Medication Changes & Studies & Tests Ordered   Patient Instructions  Medication Instructions:  Not needed  *If you need a refill on your cardiac medications before your next appointment, please call your pharmacy*   Lab Work:  LIPID CMP HgbA1c If you have labs (blood work) drawn today and your tests are completely normal, you will receive your results only by: MyChart Message (if you have MyChart) OR A paper copy in the mail If you have any lab test that is abnormal or we need to change your treatment, we will call you to review the results.   Testing/Procedures:  Will be schedule in Aug/Sept 2024 Non-Cardiac CT Angiography (CTA), is a special type of CT scan that uses a computer to produce multi-dimensional views of the aorta. In CT angiography, a contrast material is injected through an IV to help  visualize the blood vessels   Follow-Up: At [facility], you and your health needs are our priority.  As part of our continuing mission to provide you with exceptional heart care, we have created designated Provider Care Teams.  These Care Teams include your primary Cardiologist (physician) and Advanced Practice Providers (APPs -  Physician Assistants and Nurse Practitioners) who all work together to provide you with the care you need, when you need it.     Your next appointment:  5 month(s)  The format for your next appointment:   In Person  Provider:    Bernadene Person NP, Joni Reining NP, Marjie Skiff PA, and then Bryan Lemma, MD in Sept 2025  Other instrucitons  Wear support stocking/socks - light to  mediume thickness      Marykay Lex, MD, MS Bryan Lemma, M.D., M.S. Interventional Cardiologist  [facility]  Pager # [phone] Phone # [phone] [address] [place], [place] [address]   Thank you for choosing [facility] [place]!!

## 2022-07-25 NOTE — Patient Instructions (Addendum)
Medication Instructions:  Not needed  *If you need a refill on your cardiac medications before your next appointment, please call your pharmacy*   Lab Work:  LIPID CMP HgbA1c If you have labs (blood work) drawn today and your tests are completely normal, you will receive your results only by: MyChart Message (if you have MyChart) OR A paper copy in the mail If you have any lab test that is abnormal or we need to change your treatment, we will call you to review the results.   Testing/Procedures:  Will be schedule in Aug/Sept 2024 Non-Cardiac CT Angiography (CTA), is a special type of CT scan that uses a computer to produce multi-dimensional views of the aorta. In CT angiography, a contrast material is injected through an IV to help visualize the blood vessels   Follow-Up: At [facility], you and your health needs are our priority.  As part of our continuing mission to provide you with exceptional heart care, we have created designated Provider Care Teams.  These Care Teams include your primary Cardiologist (physician) and Advanced Practice Providers (APPs -  Physician Assistants and Nurse Practitioners) who all work together to provide you with the care you need, when you need it.     Your next appointment:   5 month(s)  The format for your next appointment:   In Person  Provider:    Bernadene Person NP, Joni Reining NP, Marjie Skiff PA, and then Bryan Lemma, MD in Sept 2025  Other instrucitons  Wear support stocking/socks - light to  mediume thickness

## 2022-07-31 ENCOUNTER — Other Ambulatory Visit: Payer: Self-pay

## 2022-07-31 ENCOUNTER — Ambulatory Visit (INDEPENDENT_AMBULATORY_CARE_PROVIDER_SITE_OTHER): Payer: Self-pay | Admitting: Adult Health

## 2022-07-31 VITALS — HR 73 | Temp 97.4°F

## 2022-07-31 DIAGNOSIS — I10 Essential (primary) hypertension: Secondary | ICD-10-CM

## 2022-07-31 DIAGNOSIS — E785 Hyperlipidemia, unspecified: Secondary | ICD-10-CM

## 2022-07-31 DIAGNOSIS — H1030 Unspecified acute conjunctivitis, unspecified eye: Secondary | ICD-10-CM

## 2022-07-31 DIAGNOSIS — E669 Obesity, unspecified: Secondary | ICD-10-CM

## 2022-07-31 MED ORDER — CIPROFLOXACIN HCL 0.3 % OP SOLN
1.0000 [drp] | OPHTHALMIC | 0 refills | Status: DC
Start: 2022-07-31 — End: 2023-05-21

## 2022-07-31 NOTE — Progress Notes (Signed)
[facility] [address] [place], [place] [address]   Office Visit Note  Patient Name: Jose Edwards Date of Birth [id number]  Medical Record number [id number]  Date of Service: 07/31/2022  Chief Complaint  Patient presents with   Eye Pain     Eye Pain    Pt is here for a sick visit. He reports 5 days ago he was mowing, and his eye began to water and become itchy.  He isn't sure if he has something in it, but it has intermittently flared up over the last 5 days.    Current Medication:  Outpatient Encounter Medications as of 07/31/2022  Medication Sig   ciprofloxacin (CILOXAN) 0.3 % ophthalmic solution Place 1 drop into the left eye every 2 (two) hours. Administer 1 drop, every 2 hours, while awake, for 2 days. Then 1 drop, every 4 hours, while awake, for the next 5 days.   atorvastatin (LIPITOR) 80 MG tablet TAKE ONE TABLET EVERY DAY   clopidogrel (PLAVIX) 75 MG tablet TAKE ONE TABLET (75 MG) BY MOUTH EVERY DAY   FARXIGA 10 MG TABS tablet TAKE 1 TABLET BY MOUTH DAILY BEFORE BREAKFAST   fluticasone (FLONASE) 50 MCG/ACT nasal spray Place 2 sprays into both nostrils daily.   losartan (COZAAR) 50 MG tablet TAKE ONE TABLET (50 MG) BY MOUTH EVERY DAY - DISCONTINUE 25 MG DOSE.   metoprolol succinate (TOPROL-XL) 50 MG 24 hr tablet TAKE 1 TABLET BY MOUTH DAILY WITH OR IMMEDIATELY FOLLOWING A MEAL   nitroGLYCERIN (NITROSTAT) 0.4 MG SL tablet Place 1 tablet (0.4 mg total) under the tongue every 5 (five) minutes as needed for chest pain.   omeprazole (PRILOSEC OTC) 20 MG tablet Take 20 mg by mouth daily as needed (for heartburn).   vitamin B-12 (CYANOCOBALAMIN) 100 MCG tablet Take 100 mcg by mouth every other day.    No facility-administered encounter medications on file as of 07/31/2022.      Medical History: Past Medical History:  Diagnosis Date   Acute ST elevation myocardial infarction (STEMI) of anterolateral wall (HCC) 11/26/2020   100% D1 - DES PCI 2.25 x 16 (2.4 mm)    Benign positional vertigo    CAD S/P DES PCI D1  11/27/2020   100% D1 (DES PCI with Onyx Frontier DES 2.25 x 18 - deployed to 2.4 mm)   Coronary artery disease involving native coronary artery of native heart with angina pectoris (HCC) 11/26/2020   8/29 (Ant-lat STEMI): Cath => Culprit lesion-100% D1 (DES PCI with Onyx Frontier DES 2.25 x 18 - deployed to 2.4 mm). Mid LM 30%, Ost-prox LAD 30% & RI 30%.     ED (erectile dysfunction)    GERD (gastroesophageal reflux disease)    Osteoarthrosis, unspecified whether generalized or localized, unspecified site      Vital Signs: Pulse 73   Temp (!) 97.4 F (36.3 C) (Tympanic)   SpO2 97%    Review of Systems  Eyes:  Positive for pain.    Physical Exam Eyes:     General: Lids are normal.     Extraocular Movements: Extraocular movements intact.     Conjunctiva/sclera:     Right eye: Right conjunctiva is not injected. No exudate.    Left eye: Left conjunctiva is injected. Exudate present.    Assessment/Plan: 1. Acute bacterial conjunctivitis, unspecified laterality Use cipro drops as prescribed.  Also use PAdaday drops per package instructions. Follow up via MyChart messenger if symptoms fail to improve or may  return to clinic as needed for worsening symptoms.   - ciprofloxacin (CILOXAN) 0.3 % ophthalmic solution; Place 1 drop into the left eye every 2 (two) hours. Administer 1 drop, every 2 hours, while awake, for 2 days. Then 1 drop, every 4 hours, while awake, for the next 5 days.  Dispense: 5 mL; Refill: 0     General Counseling: Traevon verbalizes understanding of the findings of todays visit and agrees with plan of treatment. I have discussed any further diagnostic evaluation that may be needed or ordered today. We also reviewed his medications today. he has been encouraged to call the office with any questions or concerns that should arise related to todays visit.   No orders of the defined types were placed in this  encounter.   Meds ordered this encounter  Medications   ciprofloxacin (CILOXAN) 0.3 % ophthalmic solution    Sig: Place 1 drop into the left eye every 2 (two) hours. Administer 1 drop, every 2 hours, while awake, for 2 days. Then 1 drop, every 4 hours, while awake, for the next 5 days.    Dispense:  5 mL    Refill:  0    Time spent:20 Minutes    Johnna Acosta AGNP-C Nurse Practitioner

## 2022-08-03 ENCOUNTER — Encounter: Payer: Self-pay | Admitting: Cardiology

## 2022-08-03 NOTE — Assessment & Plan Note (Signed)
Well-controlled BP on current dose of losartan and Toprol.  If pressures do increase, would increase losartan to 100 mg daily.

## 2022-08-03 NOTE — Assessment & Plan Note (Signed)
No further angina since PCI.  Back exercising and doing work around the house.  No major issues.  Plan: On 80 mg atorvastatin-will check labs now. On moderate dose Toprol 50 mg daily which is very beneficial for his palpitations as well On losartan 50 mg daily for afterload reduction -> would titrate up next if necessary for blood pressure control. On Farxiga On Plavix monotherapy through the end of August 2024 -> okay to hold for procedures. As of November 30, 2022, plan will be to convert to 81 mg aspirin and discontinue Plavix.

## 2022-08-03 NOTE — Assessment & Plan Note (Signed)
Dilated/ectatic thoracic aorta. Plan was to reassess with CTA prior to 19-month follow-up.

## 2022-08-03 NOTE — Assessment & Plan Note (Signed)
Recommend wearing support stockings light to medium weight. Elevate feet when possible. Continue to do pedaling type exercises to help mobilize blood flow.

## 2022-08-03 NOTE — Assessment & Plan Note (Signed)
18 months out from MI with single-vessel PCI.  Getting back to baseline level activity.  On stable regimen.  No active angina or heart failure symptoms.  Plan is complete 24 months of Plavix as of the end of August.  Then switch back to aspirin.

## 2022-08-03 NOTE — Assessment & Plan Note (Signed)
On high-dose atorvastatin 80 mg daily.  Most recent lipids showed LDL of 67 in September 2023.  Target is less than 55.  Due to recheck labs -if not checked prior to 21-month follow-up is to be done prior to that visit.

## 2022-08-03 NOTE — Assessment & Plan Note (Addendum)
Resolved after PCI and titration of GDMT medications. No active CHF symptoms.  Euvolemic on exam.  Continue current dose of losartan, metoprolol and Farxiga.

## 2022-08-03 NOTE — Assessment & Plan Note (Signed)
18 months out from MI with PCI to the diagonal branch. Now on Plavix monotherapy with plans to complete year 2.  He will be seen in 5 to 6 months at which point we will stop Plavix and go back to aspirin 81 mg.  (Roughly September 1) Okay to hold Plavix 5 to 7 days preop for surgical procedures.

## 2022-08-03 NOTE — Assessment & Plan Note (Signed)
He had done really well with weight loss, but then got out of the habit of dietary modification.  We again stressed the importance of maintaining healthy diet-Mediterranean style/DASHplant-based diet and increased exercise.  Avoid excess sweets and grazing.

## 2022-08-05 ENCOUNTER — Other Ambulatory Visit: Payer: Self-pay

## 2022-08-07 ENCOUNTER — Other Ambulatory Visit: Payer: Self-pay

## 2022-08-07 DIAGNOSIS — E669 Obesity, unspecified: Secondary | ICD-10-CM

## 2022-08-07 DIAGNOSIS — I10 Essential (primary) hypertension: Secondary | ICD-10-CM

## 2022-08-07 DIAGNOSIS — E785 Hyperlipidemia, unspecified: Secondary | ICD-10-CM

## 2022-08-07 NOTE — Progress Notes (Signed)
Labs drawn and labeled.

## 2022-08-08 LAB — COMPREHENSIVE METABOLIC PANEL

## 2022-08-08 LAB — LIPID PANEL W/O CHOL/HDL RATIO

## 2022-08-09 LAB — COMPREHENSIVE METABOLIC PANEL
ALT: 26 IU/L (ref 0–44)
AST: 27 IU/L (ref 0–40)
Albumin: 4.2 g/dL (ref 3.9–4.9)
BUN/Creatinine Ratio: 16 (ref 10–24)
BUN: 14 mg/dL (ref 8–27)
Bilirubin Total: 0.5 mg/dL (ref 0.0–1.2)
CO2: 19 mmol/L — ABNORMAL LOW (ref 20–29)
Calcium: 9.1 mg/dL (ref 8.6–10.2)
Creatinine, Ser: 0.86 mg/dL (ref 0.76–1.27)
Globulin, Total: 2.3 g/dL (ref 1.5–4.5)
Potassium: 4.1 mmol/L (ref 3.5–5.2)
eGFR: 94 mL/min/{1.73_m2} (ref >59)

## 2022-08-09 LAB — LIPID PANEL W/O CHOL/HDL RATIO
Cholesterol, Total: 103 mg/dL (ref 100–199)
HDL: 39 mg/dL — ABNORMAL LOW (ref >39)
Triglycerides: 57 mg/dL (ref 0–149)

## 2022-08-09 LAB — HGB A1C W/O EAG

## 2022-08-22 NOTE — Addendum Note (Signed)
Addended by: Tobin Chad on: 08/22/2022 01:50 PM   Modules accepted: Orders

## 2022-08-22 NOTE — Addendum Note (Signed)
Addended by: Tobin Chad on: 08/22/2022 12:45 PM   Modules accepted: Orders

## 2022-09-02 ENCOUNTER — Other Ambulatory Visit: Payer: Self-pay | Admitting: Cardiology

## 2022-09-11 ENCOUNTER — Encounter: Payer: Self-pay | Admitting: Internal Medicine

## 2022-09-11 ENCOUNTER — Telehealth: Payer: Self-pay | Admitting: Cardiology

## 2022-09-11 ENCOUNTER — Ambulatory Visit: Payer: BC Managed Care – PPO | Admitting: Internal Medicine

## 2022-09-11 VITALS — BP 122/72 | HR 73 | Temp 97.2°F | Ht 67.5 in | Wt 213.0 lb

## 2022-09-11 DIAGNOSIS — M5431 Sciatica, right side: Secondary | ICD-10-CM | POA: Diagnosis not present

## 2022-09-11 MED ORDER — PREDNISONE 20 MG PO TABS
40.0000 mg | ORAL_TABLET | Freq: Every day | ORAL | 0 refills | Status: DC
Start: 2022-09-11 — End: 2022-10-10

## 2022-09-11 NOTE — Telephone Encounter (Signed)
Wife states he only has SOB when the pain gets so severe"  He will be fine then hits him on all of the sudden and then goes away.  When it it happens he can hardly breath.  The pain is due to his leg.  She states she think his right leg. States no  Vessels look "bubbled up". No injury or bite to leg.  She thinks it is swollen at times. The swelling is on the side of his leg. States the pain is between calf and shin on the outside of the leg.  She states grabs at knee at times too. It is not continuous.   No SOB until the leg pain hits. She states this is different from his leg cramps at night.  Appt made for soonest on Friday 6/21.  He will also reach out to PCP to see if can be sooner.  Please advise if any other recommendations

## 2022-09-11 NOTE — Progress Notes (Deleted)
Cardiology Clinic Note   Patient Name: Jose Edwards Date of Encounter: 09/11/2022  Primary Care Provider:  Karie Schwalbe, MD Primary Cardiologist:  Bryan Lemma, MD  Patient Profile    67 year old male with history of coronary artery disease status post STEMI with PCI to the diagonal 1, 11/26/2020, ischemic cardiomyopathy EF 40 to 45%, resolved after PCI and titration of GDMT medications, hyperlipidemia, hypertension, aneurysm of ascending aorta chronic leg cramps, varicose veins with chronic lower extremity edema and obesity.  CT angio of the abdomen and pelvis with and without contrast to evaluate for AAA was ordered on last office visit.  Past Medical History    Past Medical History:  Diagnosis Date   Acute ST elevation myocardial infarction (STEMI) of anterolateral wall (HCC) 11/26/2020   100% D1 - DES PCI 2.25 x 16 (2.4 mm)   Benign positional vertigo    CAD S/P DES PCI D1  11/27/2020   100% D1 (DES PCI with Onyx Frontier DES 2.25 x 18 - deployed to 2.4 mm)   Coronary artery disease involving native coronary artery of native heart with angina pectoris (HCC) 11/26/2020   8/29 (Ant-lat STEMI): Cath => Culprit lesion-100% D1 (DES PCI with Onyx Frontier DES 2.25 x 18 - deployed to 2.4 mm). Mid LM 30%, Ost-prox LAD 30% & RI 30%.     ED (erectile dysfunction)    GERD (gastroesophageal reflux disease)    Osteoarthrosis, unspecified whether generalized or localized, unspecified site    Past Surgical History:  Procedure Laterality Date   ANKLE SURGERY  03/31/2006   fracture left ankle ( 2 pins)   CORONARY STENT INTERVENTION N/A 11/26/2020   Procedure: CORONARY STENT INTERVENTION;  Surgeon: Runell Gess, MD;  Location: [facility];  Service: Cardiovascular;  AntLat STEMI: (Culprit) D1 100% thrombotic. ->  DES PCI (Onyx Frontier DES 2.25 mm x 18 mm)   LEFT HEART CATH AND CORONARY ANGIOGRAPHY N/A 11/26/2020   Procedure: LEFT HEART CATH AND CORONARY ANGIOGRAPHY;   Surgeon: Runell Gess, MD;  Location: [facility];  Service: Cardiovascular; Antlat STEMI: Ostial-mid LM 30%.  RI 30%. ostial-proximal LAD 30%.  (Culprit) D1 100% thrombotic. ->  DES PCI   ROTATOR CUFF REPAIR  04/01/2003   right shoulder   TRANSTHORACIC ECHOCARDIOGRAM  04/02/2021   (Follow-up after medication adjustment from MI) EF 55-60%. No RWMA.  Normal Valves.   TRANSTHORACIC ECHOCARDIOGRAM  11/28/2018   (In setting of Anterolateral STEMI).  EF 40 to 45%.  Mildly decreased function.  Mild concentric LVH.  GRII DD.  Elevated LAP with moderate HK of apical anterior wall.  Mild LA dilation.  Mild AOV sclerosis-no stenosis.  Elevated RAP estimated 15 mmHg.  Normal RV size and function.    Allergies  Allergies  Allergen Reactions   Gabapentin Other (See Comments)    Causes aggressive feelings   Pseudoephedrine Other (See Comments)    Makes the patient drowsy    History of Present Illness    ***  Home Medications    Current Outpatient Medications  Medication Sig Dispense Refill   atorvastatin (LIPITOR) 80 MG tablet TAKE ONE TABLET EVERY DAY 90 tablet 3   ciprofloxacin (CILOXAN) 0.3 % ophthalmic solution Place 1 drop into the left eye every 2 (two) hours. Administer 1 drop, every 2 hours, while awake, for 2 days. Then 1 drop, every 4 hours, while awake, for the next 5 days. 5 mL 0   clopidogrel (PLAVIX) 75 MG tablet TAKE ONE  TABLET (75 MG) BY MOUTH EVERY DAY 90 tablet 3   FARXIGA 10 MG TABS tablet TAKE 1 TABLET BY MOUTH DAILY BEFORE BREAKFAST 30 tablet 11   fluticasone (FLONASE) 50 MCG/ACT nasal spray Place 2 sprays into both nostrils daily. 16 g 2   losartan (COZAAR) 50 MG tablet TAKE ONE TABLET (50 MG) BY MOUTH EVERY DAY - DISCONTINUE 25 MG DOSE. 90 tablet 3   metoprolol succinate (TOPROL-XL) 50 MG 24 hr tablet TAKE 1 TABLET BY MOUTH DAILY WITH OR IMMEDIATELY FOLLOWING A MEAL 30 tablet 11   nitroGLYCERIN (NITROSTAT) 0.4 MG SL tablet Place 1 tablet (0.4 mg total) under the  tongue every 5 (five) minutes as needed for chest pain. 25 tablet 1   predniSONE (DELTASONE) 20 MG tablet Take 2 tablets (40 mg total) by mouth daily. For 3 days, then 1 tab daily for 3 days 9 tablet 0   vitamin B-12 (CYANOCOBALAMIN) 100 MCG tablet Take 100 mcg by mouth every other day.      No current facility-administered medications for this visit.     Family History    Family History  Problem Relation Age of Onset   Cancer Mother    Diabetes Sister    Diabetes Paternal Grandmother    Cancer Father    Coronary artery disease Neg Hx    Hypertension Neg Hx    He indicated that his mother is deceased. He indicated that his father is deceased. He indicated that the status of his sister is unknown. He indicated that the status of his paternal grandmother is unknown and reported the following: may have had colon cancer in 59's. He indicated that the status of his neg hx is unknown.  Social History    Social History   Socioeconomic History   Marital status: Married    Spouse name: Not on file   Number of children: 3   Years of education: 12   Highest education level: Not on file  Occupational History   Occupation: CARPENTER    Employer: [employer]  Tobacco Use   Smoking status: Never   Smokeless tobacco: Never  Vaping Use   Vaping Use: Never used  Substance and Sexual Activity   Alcohol use: Yes    Comment: Beer socially (once weekly)   Drug use: No   Sexual activity: Not on file  Other Topics Concern   Not on file  Social History Narrative   Lives with wife in a one story home.  Has 3 sons.  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  Education: high school.   Social Determinants of Health   Financial Resource Strain: Not on file  Food Insecurity: Not on file  Transportation Needs: Not on file  Physical Activity: Not on file  Stress: Not on file  Social Connections: Not on file  Intimate Partner Violence: Not on file     Review of Systems    General:   No chills, fever, night sweats or weight changes.  Cardiovascular:  No chest pain, dyspnea on exertion, edema, orthopnea, palpitations, paroxysmal nocturnal dyspnea. Dermatological: No rash, lesions/masses Respiratory: No cough, dyspnea Urologic: No hematuria, dysuria Abdominal:   No nausea, vomiting, diarrhea, bright red blood per rectum, melena, or hematemesis Neurologic:  No visual changes, wkns, changes in mental status. All other systems reviewed and are otherwise negative except as noted above.     Physical Exam    VS:  There were no vitals taken for this visit. , BMI There is  no height or weight on file to calculate BMI.     GEN: Well nourished, well developed, in no acute distress. HEENT: normal. Neck: Supple, no JVD, carotid bruits, or masses. Cardiac: RRR, no murmurs, rubs, or gallops. No clubbing, cyanosis, edema.  Radials/DP/PT 2+ and equal bilaterally.  Respiratory:  Respirations regular and unlabored, clear to auscultation bilaterally. GI: Soft, nontender, nondistended, BS + x 4. MS: no deformity or atrophy. Skin: warm and dry, no rash. Neuro:  Strength and sensation are intact. Psych: Normal affect.  Accessory Clinical Findings    ECG personally reviewed by me today- *** - No acute changes  Lab Results  Component Value Date   WBC 9.1 11/27/2020   HGB 13.5 11/27/2020   HCT 40.6 11/27/2020   MCV 89.6 11/27/2020   PLT 231 11/27/2020   Lab Results  Component Value Date   CREATININE 0.86 08/07/2022   BUN 14 08/07/2022   NA 141 08/07/2022   K 4.1 08/07/2022   CL 108 (H) 08/07/2022   CO2 19 (L) 08/07/2022   Lab Results  Component Value Date   ALT 26 08/07/2022   AST 27 08/07/2022   ALKPHOS 85 08/07/2022   BILITOT 0.5 08/07/2022   Lab Results  Component Value Date   CHOL 103 08/07/2022   HDL 39 (L) 08/07/2022   LDLCALC 51 08/07/2022   LDLDIRECT 155.1 02/27/2010   TRIG 57 08/07/2022   CHOLHDL 2.7 12/13/2021    Lab Results  Component Value Date    HGBA1C CANCELED 08/07/2022    Review of Prior Studies: Cardiac Cath-PCI 11/26/2020: (Culprit) D1 100% thrombotic. ->  DES PCI (Onyx Frontier DES 2.25 mm x 18 mm); Ostial-mid LM 30%.  RI 30%. ostial-proximal LAD 30%.  (cath films personally reviewed)     Assessment & Plan   1.  ***     {Are you ordering a CV Procedure (e.g. stress test, cath, DCCV, TEE, etc)?   Press F2        :[id number]}   Signed, Bettey Mare. Liborio Nixon, ANP, AACC   09/11/2022 4:09 PM      Office [phone] Fax [phone]  Notice: This dictation was prepared with Dragon dictation along with smaller phrase technology. Any transcriptional errors that result from this process are unintentional and may not be corrected upon review.

## 2022-09-11 NOTE — Patient Instructions (Signed)
Sciatica  Sciatica is pain, numbness, weakness, or tingling along the path of the sciatic nerve. The sciatic nerve starts in the lower back and runs down the back of each leg. The nerve controls the muscles in the lower leg and in the back of the knee. It also provides feeling (sensation) to the back of the thigh, the lower leg, and the sole of the foot. Sciatica is a symptom of another medical condition that pinches or puts pressure on the sciatic nerve. Sciatica most often only affects one side of the body. Sciatica usually goes away on its own or with treatment. In some cases, sciatica may come back (recur). What are the causes? This condition is caused by pressure on the sciatic nerve or pinching of the nerve. This may be the result of: A disk in between the bones of the spine bulging out too far (herniated disk). Age-related changes in the spinal disks. A pain disorder that affects a muscle in the buttock. Extra bone growth near the sciatic nerve. A break (fracture) of the pelvis. Pregnancy. Tumor. This is rare. What increases the risk? The following factors may make you more likely to develop this condition: Playing sports that place pressure or stress on the spine. Having poor strength and flexibility. A history of back injury or surgery. Sitting for long periods of time. Doing activities that involve repetitive bending or lifting. Obesity. What are the signs or symptoms? Symptoms can vary from mild to very severe. They may include: Any of the following problems in the lower back, leg, hip, or buttock: Mild tingling, numbness, or dull aches. Burning sensations. Sharp pains. Numbness in the back of the calf or the sole of the foot. Leg weakness. Severe back pain that makes movement difficult. Symptoms may get worse when you cough, sneeze, or laugh, or when you sit or stand for long periods of time. How is this diagnosed? This condition may be diagnosed based on: Your symptoms  and medical history. A physical exam. Blood tests. Imaging tests, such as: X-rays. An MRI. A CT scan. How is this treated? In many cases, this condition improves on its own without treatment. However, treatment may include: Reducing or modifying physical activity. Exercising, including strengthening and stretching. Icing and applying heat to the affected area. Medicines that help to: Relieve pain and swelling. Relax your muscles. Injections of medicines that help to relieve pain and inflammation (steroids) around the sciatic nerve. Surgery. Follow these instructions at home: Medicines Take over-the-counter and prescription medicines only as told by your health care provider. Ask your health care provider if the medicine prescribed to you requires you to avoid driving or using heavy machinery. Managing pain     If directed, put ice on the affected area. To do this: Put ice in a plastic bag. Place a towel between your skin and the bag. Leave the ice on for 20 minutes, 2-3 times a day. If your skin turns bright red, remove the ice right away to prevent skin damage. The risk of skin damage is higher if you cannot feel pain, heat, or cold. If directed, apply heat to the affected area as often as told by your health care provider. Use the heat source that your health care provider recommends, such as a moist heat pack or a heating pad. Place a towel between your skin and the heat source. Leave the heat on for 20-30 minutes. If your skin turns bright red, remove the heat right away to prevent burns. The   risk of burns is higher if you cannot feel pain, heat, or cold. Activity  Return to your normal activities as told by your health care provider. Ask your health care provider what activities are safe for you. Avoid activities that make your symptoms worse. Take brief periods of rest throughout the day. When you rest for longer periods, mix in some mild activity or stretching between  periods of rest. This will help to prevent stiffness and pain. Avoid sitting for long periods of time without moving. Get up and move around at least one time each hour. Exercise and stretch regularly as told by your health care provider. Do not lift anything that is heavier than 10 lb (4.5 kg) until your health care provider says that it is safe. When you do not have symptoms, you should still avoid heavy lifting, especially repetitive heavy lifting. When you lift objects, always use proper lifting technique, which includes: Bending your knees. Keeping the load close to your body. Avoiding twisting. General instructions Maintain a healthy weight. Excess weight puts extra stress on your back. Wear supportive, comfortable shoes. Avoid wearing high heels. Avoid sleeping on a mattress that is too soft or too hard. A mattress that is firm enough to support your back when you sleep may help to reduce your pain. Contact a health care provider if: Your pain is not controlled by medicine. Your pain does not improve or gets worse. Your pain lasts longer than 4 weeks. You have unexplained weight loss. Get help right away if: You are not able to control when you urinate or have bowel movements (incontinence). You have: Weakness in your lower back, pelvis, buttocks, or legs that gets worse. Redness or swelling of your back. A burning sensation when you urinate. Summary Sciatica is pain, numbness, weakness, or tingling along the path of the sciatic nerve, which may include the lower back, legs, hips, and buttocks. This condition is caused by pressure on the sciatic nerve or pinching of the nerve. Treatment often includes rest, exercise, medicines, and applying ice or heat. This information is not intended to replace advice given to you by your health care provider. Make sure you discuss any questions you have with your health care provider. Document Revised: 06/24/2021 Document Reviewed:  06/24/2021 Elsevier Patient Education  2024 [employer] Inc.  

## 2022-09-11 NOTE — Telephone Encounter (Signed)
Call to patient wife.  LVM that The doctor does not think it is cardiac related and the patient should reach out to his PCP to be evaluated

## 2022-09-11 NOTE — Assessment & Plan Note (Signed)
Reassured Nothing to suggest HNP Discussed staying active  Okay to continue topical meds Will give prednisone 40 x 3, 20 x 3

## 2022-09-11 NOTE — Telephone Encounter (Signed)
Pt c/o swelling: STAT is pt has developed SOB within 24 hours  If swelling, where is the swelling located?   Right calf and side of ankle  How much weight have you gained and in what time span?   No  Have you gained 3 pounds in a day or 5 pounds in a week?  No  Do you have a log of your daily weights (if so, list)?   No  Are you currently taking a fluid pill?   No  Are you currently SOB?   Yes  Have you traveled recently?   No   Wife states patient is having "puffed out veins" for about 2 weeks. Wife states when patient has pain he gets SOB.

## 2022-09-11 NOTE — Progress Notes (Signed)
Subjective:    Patient ID: Jose Edwards, male    DOB: [birth year], 67 y.o.   MRN: [id number]  HPI Here due to right leg pain  "Giving me a fit" Started 2-3 weeks ago--but intermittent Now It is more persistent Sometimes feels it below buttock and down back of leg---like sciatica Hamstring feels tight Sometimes it hurts in knee and below---and not in the hip  Did have ER visit once due to knee swelling---had fluid drawn out (20 years ago)  Better if he gets up and walks Driving in truck usually worsens it No leg weakness  Tried topical balms in posterior hip and knees--may have helped some Some tylenol--may have helped some also  Current Outpatient Medications on File Prior to Visit  Medication Sig Dispense Refill   atorvastatin (LIPITOR) 80 MG tablet TAKE ONE TABLET EVERY DAY 90 tablet 3   ciprofloxacin (CILOXAN) 0.3 % ophthalmic solution Place 1 drop into the left eye every 2 (two) hours. Administer 1 drop, every 2 hours, while awake, for 2 days. Then 1 drop, every 4 hours, while awake, for the next 5 days. 5 mL 0   clopidogrel (PLAVIX) 75 MG tablet TAKE ONE TABLET (75 MG) BY MOUTH EVERY DAY 90 tablet 3   FARXIGA 10 MG TABS tablet TAKE 1 TABLET BY MOUTH DAILY BEFORE BREAKFAST 30 tablet 11   fluticasone (FLONASE) 50 MCG/ACT nasal spray Place 2 sprays into both nostrils daily. 16 g 2   losartan (COZAAR) 50 MG tablet TAKE ONE TABLET (50 MG) BY MOUTH EVERY DAY - DISCONTINUE 25 MG DOSE. 90 tablet 3   metoprolol succinate (TOPROL-XL) 50 MG 24 hr tablet TAKE 1 TABLET BY MOUTH DAILY WITH OR IMMEDIATELY FOLLOWING A MEAL 30 tablet 11   vitamin B-12 (CYANOCOBALAMIN) 100 MCG tablet Take 100 mcg by mouth every other day.      nitroGLYCERIN (NITROSTAT) 0.4 MG SL tablet Place 1 tablet (0.4 mg total) under the tongue every 5 (five) minutes as needed for chest pain. 25 tablet 1   No current facility-administered medications on file prior to visit.    Allergies  Allergen Reactions    Gabapentin Other (See Comments)    Causes aggressive feelings   Pseudoephedrine Other (See Comments)    Makes the patient drowsy    Past Medical History:  Diagnosis Date   Acute ST elevation myocardial infarction (STEMI) of anterolateral wall (HCC) 11/26/2020   100% D1 - DES PCI 2.25 x 16 (2.4 mm)   Benign positional vertigo    CAD S/P DES PCI D1  11/27/2020   100% D1 (DES PCI with Onyx Frontier DES 2.25 x 18 - deployed to 2.4 mm)   Coronary artery disease involving native coronary artery of native heart with angina pectoris (HCC) 11/26/2020   8/29 (Ant-lat STEMI): Cath => Culprit lesion-100% D1 (DES PCI with Onyx Frontier DES 2.25 x 18 - deployed to 2.4 mm). Mid LM 30%, Ost-prox LAD 30% & RI 30%.     ED (erectile dysfunction)    GERD (gastroesophageal reflux disease)    Osteoarthrosis, unspecified whether generalized or localized, unspecified site     Past Surgical History:  Procedure Laterality Date   ANKLE SURGERY  03/31/2006   fracture left ankle ( 2 pins)   CORONARY STENT INTERVENTION N/A 11/26/2020   Procedure: CORONARY STENT INTERVENTION;  Surgeon: Runell Gess, MD;  Location: [facility];  Service: Cardiovascular;  AntLat STEMI: (Culprit) D1 100% thrombotic. ->  DES PCI (Onyx Frontier  DES 2.25 mm x 18 mm)   LEFT HEART CATH AND CORONARY ANGIOGRAPHY N/A 11/26/2020   Procedure: LEFT HEART CATH AND CORONARY ANGIOGRAPHY;  Surgeon: Runell Gess, MD;  Location: [facility];  Service: Cardiovascular; Antlat STEMI: Ostial-mid LM 30%.  RI 30%. ostial-proximal LAD 30%.  (Culprit) D1 100% thrombotic. ->  DES PCI   ROTATOR CUFF REPAIR  04/01/2003   right shoulder   TRANSTHORACIC ECHOCARDIOGRAM  04/02/2021   (Follow-up after medication adjustment from MI) EF 55-60%. No RWMA.  Normal Valves.   TRANSTHORACIC ECHOCARDIOGRAM  11/28/2018   (In setting of Anterolateral STEMI).  EF 40 to 45%.  Mildly decreased function.  Mild concentric LVH.  GRII DD.  Elevated LAP with  moderate HK of apical anterior wall.  Mild LA dilation.  Mild AOV sclerosis-no stenosis.  Elevated RAP estimated 15 mmHg.  Normal RV size and function.    Family History  Problem Relation Age of Onset   Cancer Mother    Diabetes Sister    Diabetes Paternal Grandmother    Cancer Father    Coronary artery disease Neg Hx    Hypertension Neg Hx     Social History   Socioeconomic History   Marital status: Married    Spouse name: Not on file   Number of children: 3   Years of education: 12   Highest education level: Not on file  Occupational History   Occupation: CARPENTER    Employer: [employer]  Tobacco Use   Smoking status: Never   Smokeless tobacco: Never  Vaping Use   Vaping Use: Never used  Substance and Sexual Activity   Alcohol use: Yes    Comment: Beer socially (once weekly)   Drug use: No   Sexual activity: Not on file  Other Topics Concern   Not on file  Social History Narrative   Lives with wife in a one story home.  Has 3 sons.  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  Education: high school.   Social Determinants of Health   Financial Resource Strain: Not on file  Food Insecurity: Not on file  Transportation Needs: Not on file  Physical Activity: Not on file  Stress: Not on file  Social Connections: Not on file  Intimate Partner Violence: Not on file   Review of Systems No loss of bladder or bowel control No groin sensory changes     Objective:   Physical Exam Cardiovascular:     Pulses: Normal pulses.  Musculoskeletal:     Comments: No spine tenderness--normal flexion No tenderness at hip SLR negative Normal ROM in hip and knee (no ligament or meniscus findings in knee)  Neurological:     Comments: Antalgic gait at first--then evened out No leg weakness            Assessment & Plan:

## 2022-09-11 NOTE — Telephone Encounter (Signed)
Agree with reaching out to PCP -- doesn't sound cardiac.  DH

## 2022-09-12 NOTE — Telephone Encounter (Signed)
LVM again regarding message, Does not sound cardiac related and patient should reach out to PCP.  Call if any further questions.

## 2022-09-19 ENCOUNTER — Ambulatory Visit: Payer: BC Managed Care – PPO | Admitting: Adult Health

## 2022-09-23 IMAGING — CT CT ANGIO CHEST-ABD-PELV FOR DISSECTION W/ AND WO/W CM
2 of 5 series · 11 of 46 positions shown, 16 images · non-contrast
Comparison: None.

CLINICAL DATA: STEMI, widened mediastinum on chest radiographs,
concerning for type A dissection

EXAM:
CT ANGIOGRAPHY CHEST, ABDOMEN AND PELVIS
TECHNIQUE: Non-contrast CT of the chest was initially obtained.

[Series 5: chest w/o 5mm lung · axial · non-contrast · 0.80mm/px · z∈[+1212,+1448]mm · 8 of 61 slices shown, 13 images]
[im 7/61  soft-tissue]
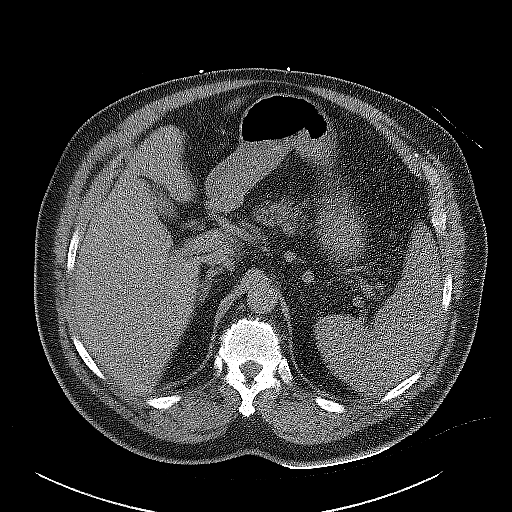
[im 7/61  bone]
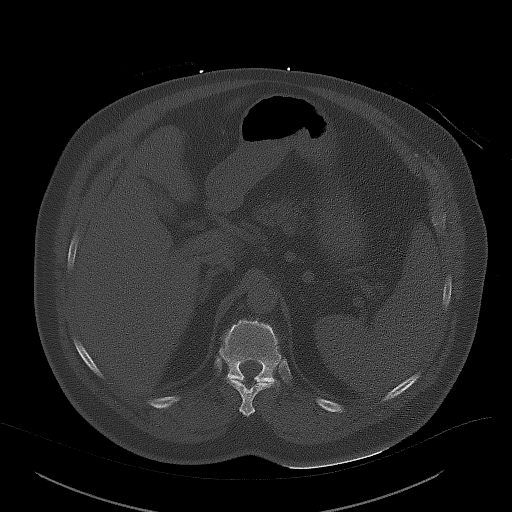
[im 14/61  soft-tissue]
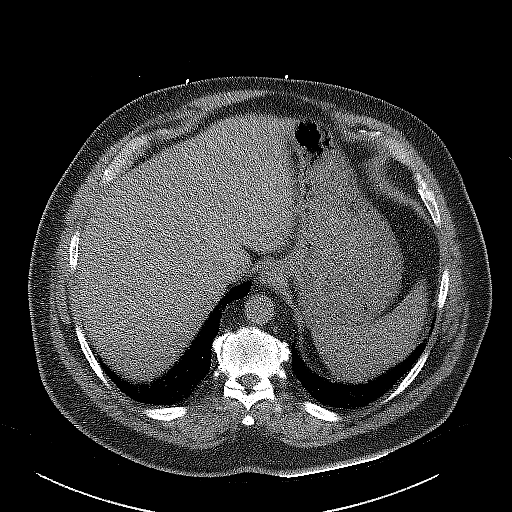
[im 21/61  soft-tissue]
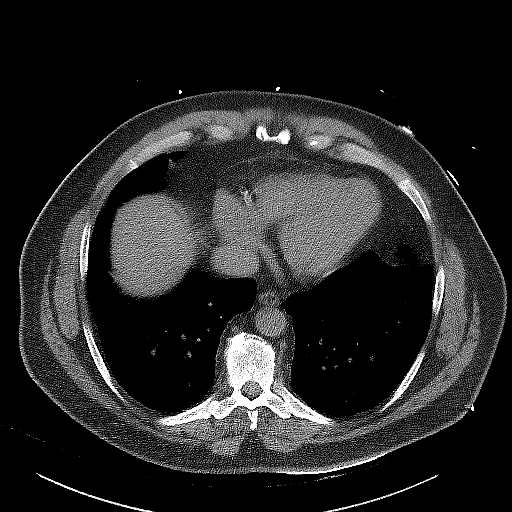
[im 27/61  soft-tissue]
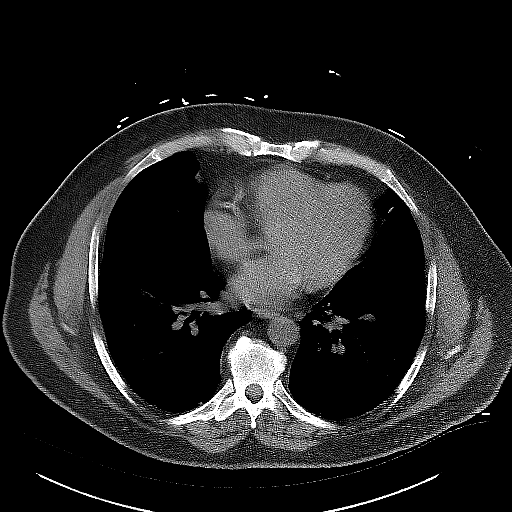
[im 34/61  soft-tissue]
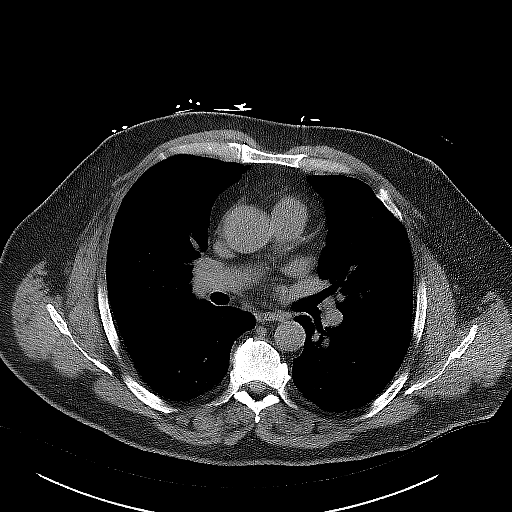
[im 34/61  lung]
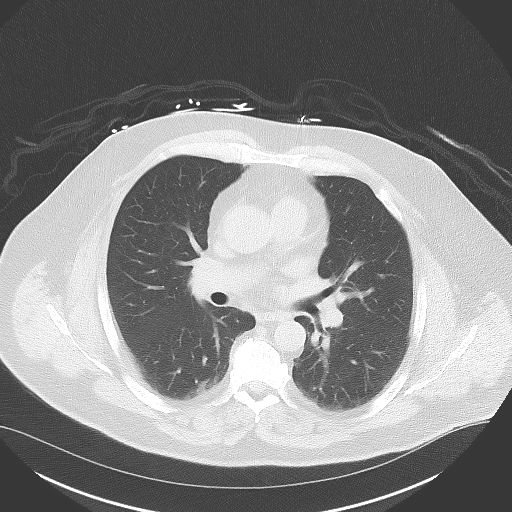
[im 41/61  soft-tissue]
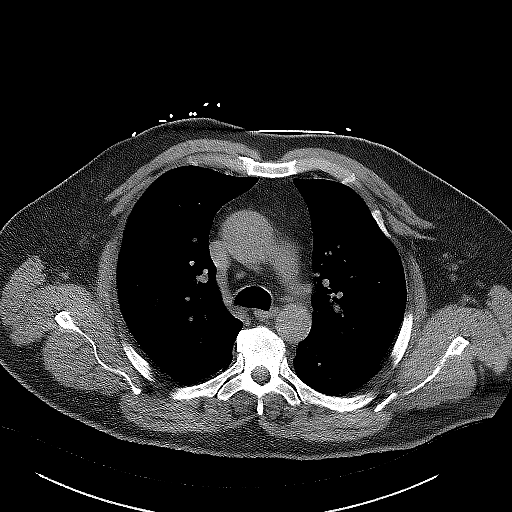
[im 41/61  lung]
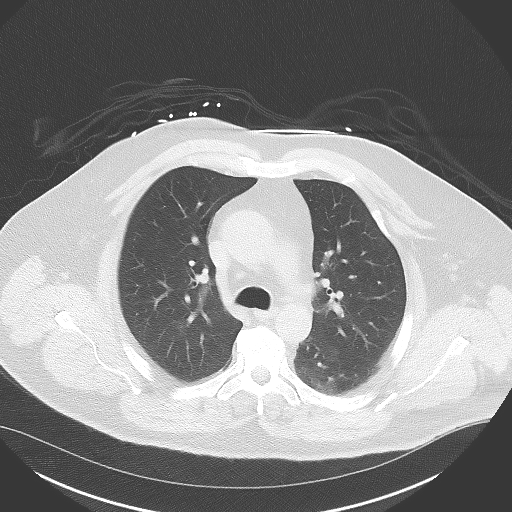
[im 47/61  soft-tissue]
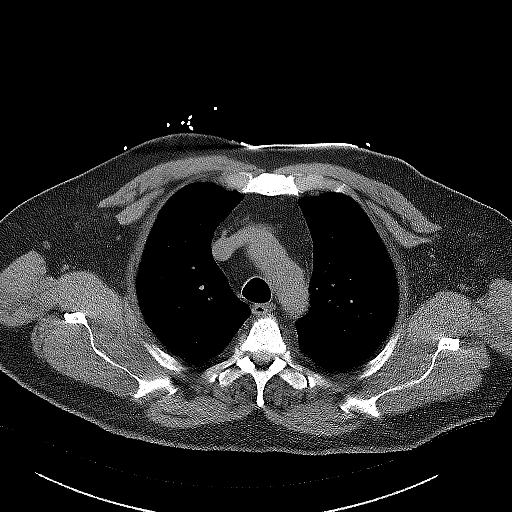
[im 47/61  lung]
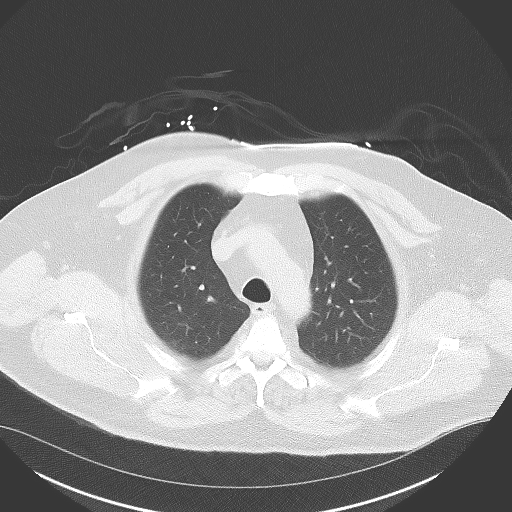
[im 54/61  soft-tissue]
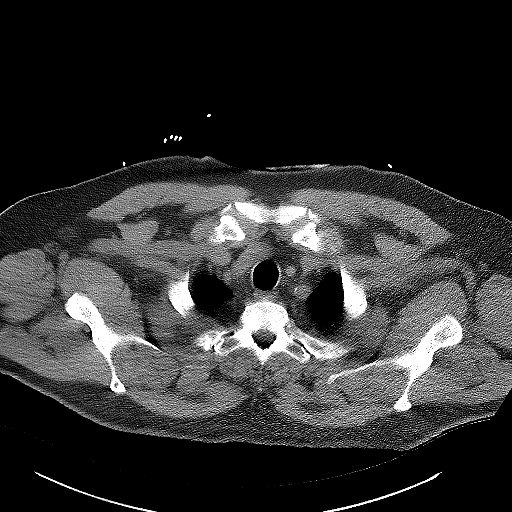
[im 54/61  lung]
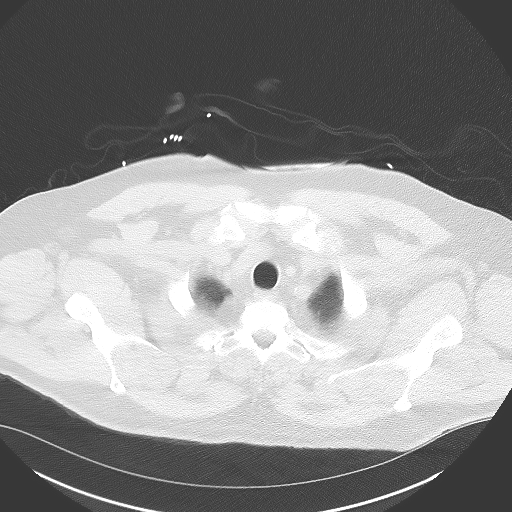

[Series 11: dissection 2mm cor · coronal · 0.84mm/px · 3 of 165 slices shown]
[im 42/165  soft-tissue]
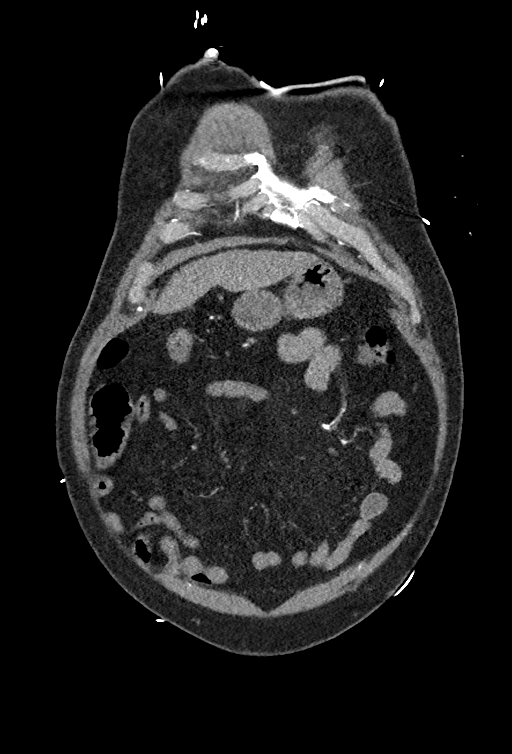
[im 83/165  soft-tissue]
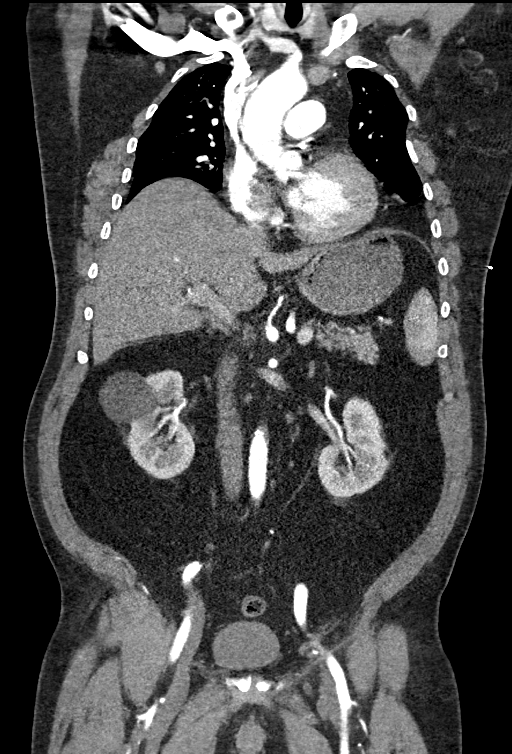
[im 124/165  soft-tissue]
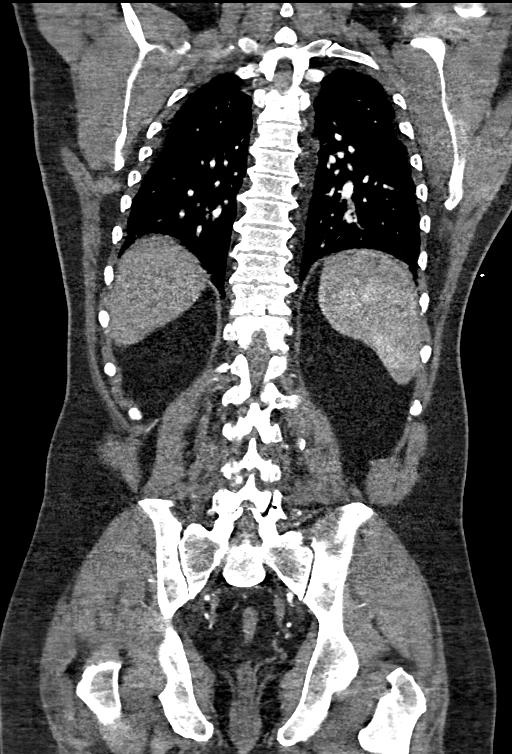

[11 of 46 positions shown; findings below may reference images not displayed]

Multidetector CT imaging through the chest, abdomen and pelvis was
performed using the standard protocol during bolus administration of
intravenous contrast. Multiplanar reconstructed images and MIPs were
obtained and reviewed to evaluate the vascular anatomy.

CONTRAST:  100mL OMNIPAQUE IOHEXOL 350 MG/ML SOLN
FINDINGS: CTA CHEST FINDINGS

Cardiovascular: Preferential opacification of the thoracic aorta.
Mild enlargement of the tubular ascending thoracic aorta, measuring
up to 4.1 x 4.0 cm. The remainder of the vessel is normal in
caliber, descending thoracic aorta measuring up to 2.6 x 2.5 cm. No
evidence of aortic dissection or other acute aortic pathology. No
significant atherosclerosis. Normal heart size. Three-vessel
coronary artery calcifications. No pericardial effusion.

Mediastinum/Nodes: No enlarged mediastinal, hilar, or axillary lymph
nodes. Thyroid gland, trachea, and esophagus demonstrate no
significant findings.

Lungs/Pleura: Lungs are clear. No pleural effusion or pneumothorax.

Musculoskeletal: No chest wall abnormality. No acute or significant
osseous findings.

Review of the MIP images confirms the above findings.

CTA ABDOMEN AND PELVIS FINDINGS

VASCULAR

Normal contour and caliber of the abdominal aorta. No evidence of
aneurysm, dissection, or other acute aortic pathology. Standard
branching pattern of the abdominal aorta with solitary bilateral
renal arteries.

Review of the MIP images confirms the above findings.

NON-VASCULAR

Hepatobiliary: No solid liver abnormality is seen. No gallstones,
gallbladder wall thickening, or biliary dilatation.

Pancreas: Unremarkable. No pancreatic ductal dilatation or
surrounding inflammatory changes.

Spleen: Normal in size without significant abnormality.

Adrenals/Urinary Tract: Adrenal glands are unremarkable. Simple cyst
of the midportion of the right kidney. Kidneys are otherwise normal,
without renal calculi, solid lesion, or hydronephrosis. Bladder is
unremarkable.

Stomach/Bowel: Stomach is within normal limits. Appendix appears
normal. No evidence of bowel wall thickening, distention, or
inflammatory changes. Sigmoid diverticula.

Lymphatic: No enlarged abdominal or pelvic lymph nodes.

Reproductive: Mild prostatomegaly.

Other: Small, fat containing bilateral inguinal hernias. No
abdominopelvic ascites.

Musculoskeletal: No acute or significant osseous findings.

Review of the MIP images confirms the above findings.
IMPRESSION: 1. Mild enlargement of the tubular ascending thoracic aorta,
measuring up to 4.1 x 4.0 cm. The remainder of the thoracic and
abdominal vessel is normal in caliber. Mild mixed atherosclerosis of
the abdominal aorta. Recommend annual imaging followup by CTA or
MRA. This recommendation follows 4060
ACCF/AHA/AATS/ACR/ASA/SCA/GEETIKA/KUKKAI/ANA GABRIELA/INVICIBLE Guidelines for the
Diagnosis and Management of Patients with Thoracic Aortic Disease.
Circulation. 4060; 121: E266-e369. Aortic aneurysm NOS ([id number])
2. No evidence of aortic dissection or other acute aortic pathology.
3. Coronary artery disease.
4. Mild prostatomegaly.
5. Sigmoid diverticulosis without evidence of diverticulitis.

These results were called by telephone at the time of interpretation
on 11/26/2020 at [DATE] to Dr. Johann Vootele, who verbally acknowledged
these results.

## 2022-10-07 ENCOUNTER — Telehealth: Payer: Self-pay | Admitting: Internal Medicine

## 2022-10-07 NOTE — Telephone Encounter (Signed)
Called and spoke to pt's wife per DPR. Made an appt with Dr Alphonsus Sias 10-10-22.

## 2022-10-07 NOTE — Telephone Encounter (Signed)
Patient wife Clydie Braun called in and stated that Jose Edwards was prescribed predniSONE (DELTASONE) 20 MG tablet. She stated that while taking it he wasn't having any pain and was doing fine but now he isn't taking it and he is back in pain. She stated that now he has some red spot as in his calf and his heel that have started to appear. She was wanting to know what is the next step. Thank you!

## 2022-10-10 ENCOUNTER — Encounter: Payer: Self-pay | Admitting: Internal Medicine

## 2022-10-10 ENCOUNTER — Ambulatory Visit: Payer: BC Managed Care – PPO | Admitting: Internal Medicine

## 2022-10-10 VITALS — BP 120/74 | HR 70 | Temp 97.5°F | Ht 67.5 in | Wt 216.0 lb

## 2022-10-10 DIAGNOSIS — M5431 Sciatica, right side: Secondary | ICD-10-CM | POA: Diagnosis not present

## 2022-10-10 DIAGNOSIS — H1132 Conjunctival hemorrhage, left eye: Secondary | ICD-10-CM | POA: Diagnosis not present

## 2022-10-10 MED ORDER — PREDNISONE 20 MG PO TABS: 40.0000 mg | ORAL_TABLET | Freq: Every day | ORAL | 0 refills | Status: DC

## 2022-10-10 NOTE — Assessment & Plan Note (Signed)
Still clearly seems like sciatica Using low dose naproxen Stays active Will try the prednisone 40x 3, 20 x 7 If ongoing issues, will set up with Dr Patsy Lager

## 2022-10-10 NOTE — Assessment & Plan Note (Signed)
Reassured ---no infection If pain--would set up with eye doctor

## 2022-10-10 NOTE — Progress Notes (Signed)
Subjective:    Patient ID: Jose Edwards, male    DOB: Apr 15, 1955, 67 y.o.   MRN: [id number]  HPI Here due to recurrence of sciatic pain  Did well after last visit when on the prednisone Effect waned over 2-3 days now back to where he was before Does okay when walking around Worst when sitting--especially in vehicle---trouble even using the pedals  Pain peaks behind right knee and down calf---will occ go down to foot  No heavy lifting No new injuries Now with new "catch" in center of back yesterday  Has been trying aleve-- 1 twice a day  Also has some redness in lower left eye No foreign body No pain No change in vision  Current Outpatient Medications on File Prior to Visit  Medication Sig Dispense Refill   atorvastatin (LIPITOR) 80 MG tablet TAKE ONE TABLET EVERY DAY 90 tablet 3   ciprofloxacin (CILOXAN) 0.3 % ophthalmic solution Place 1 drop into the left eye every 2 (two) hours. Administer 1 drop, every 2 hours, while awake, for 2 days. Then 1 drop, every 4 hours, while awake, for the next 5 days. 5 mL 0   clopidogrel (PLAVIX) 75 MG tablet TAKE ONE TABLET (75 MG) BY MOUTH EVERY DAY 90 tablet 3   FARXIGA 10 MG TABS tablet TAKE 1 TABLET BY MOUTH DAILY BEFORE BREAKFAST 30 tablet 11   fluticasone (FLONASE) 50 MCG/ACT nasal spray Place 2 sprays into both nostrils daily. 16 g 2   losartan (COZAAR) 50 MG tablet TAKE ONE TABLET (50 MG) BY MOUTH EVERY DAY - DISCONTINUE 25 MG DOSE. 90 tablet 3   metoprolol succinate (TOPROL-XL) 50 MG 24 hr tablet TAKE 1 TABLET BY MOUTH DAILY WITH OR IMMEDIATELY FOLLOWING A MEAL 30 tablet 11   vitamin B-12 (CYANOCOBALAMIN) 100 MCG tablet Take 100 mcg by mouth every other day.      nitroGLYCERIN (NITROSTAT) 0.4 MG SL tablet Place 1 tablet (0.4 mg total) under the tongue every 5 (five) minutes as needed for chest pain. 25 tablet 1   No current facility-administered medications on file prior to visit.    Allergies  Allergen Reactions    Gabapentin Other (See Comments)    Causes aggressive feelings   Pseudoephedrine Other (See Comments)    Makes the patient drowsy    Past Medical History:  Diagnosis Date   Acute ST elevation myocardial infarction (STEMI) of anterolateral wall (HCC) 11/26/2020   100% D1 - DES PCI 2.25 x 16 (2.4 mm)   Benign positional vertigo    CAD S/P DES PCI D1  11/27/2020   100% D1 (DES PCI with Onyx Frontier DES 2.25 x 18 - deployed to 2.4 mm)   Coronary artery disease involving native coronary artery of native heart with angina pectoris (HCC) 11/26/2020   8/29 (Ant-lat STEMI): Cath => Culprit lesion-100% D1 (DES PCI with Onyx Frontier DES 2.25 x 18 - deployed to 2.4 mm). Mid LM 30%, Ost-prox LAD 30% & RI 30%.     ED (erectile dysfunction)    GERD (gastroesophageal reflux disease)    Osteoarthrosis, unspecified whether generalized or localized, unspecified site     Past Surgical History:  Procedure Laterality Date   ANKLE SURGERY  03/31/2006   fracture left ankle ( 2 pins)   CORONARY STENT INTERVENTION N/A 11/26/2020   Procedure: CORONARY STENT INTERVENTION;  Surgeon: Runell Gess, MD;  Location: [facility];  Service: Cardiovascular;  AntLat STEMI: (Culprit) D1 100% thrombotic. ->  DES  PCI (Onyx Frontier DES 2.25 mm x 18 mm)   LEFT HEART CATH AND CORONARY ANGIOGRAPHY N/A 11/26/2020   Procedure: LEFT HEART CATH AND CORONARY ANGIOGRAPHY;  Surgeon: Runell Gess, MD;  Location: [facility];  Service: Cardiovascular; Antlat STEMI: Ostial-mid LM 30%.  RI 30%. ostial-proximal LAD 30%.  (Culprit) D1 100% thrombotic. ->  DES PCI   ROTATOR CUFF REPAIR  04/01/2003   right shoulder   TRANSTHORACIC ECHOCARDIOGRAM  04/02/2021   (Follow-up after medication adjustment from MI) EF 55-60%. No RWMA.  Normal Valves.   TRANSTHORACIC ECHOCARDIOGRAM  11/28/2018   (In setting of Anterolateral STEMI).  EF 40 to 45%.  Mildly decreased function.  Mild concentric LVH.  GRII DD.  Elevated LAP with  moderate HK of apical anterior wall.  Mild LA dilation.  Mild AOV sclerosis-no stenosis.  Elevated RAP estimated 15 mmHg.  Normal RV size and function.    Family History  Problem Relation Age of Onset   Cancer Mother    Diabetes Sister    Diabetes Paternal Grandmother    Cancer Father    Coronary artery disease Neg Hx    Hypertension Neg Hx     Social History   Socioeconomic History   Marital status: Married    Spouse name: Not on file   Number of children: 3   Years of education: 12   Highest education level: Not on file  Occupational History   Occupation: CARPENTER    Employer: [employer]  Tobacco Use   Smoking status: Never   Smokeless tobacco: Never  Vaping Use   Vaping status: Never Used  Substance and Sexual Activity   Alcohol use: Yes    Comment: Beer socially (once weekly)   Drug use: No   Sexual activity: Not on file  Other Topics Concern   Not on file  Social History Narrative   Lives with wife in a one story home.  Has 3 sons.  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  Education: high school.   Social Determinants of Health   Financial Resource Strain: Not on file  Food Insecurity: Not on file  Transportation Needs: Not on file  Physical Activity: Not on file  Stress: Not on file  Social Connections: Not on file  Intimate Partner Violence: Not on file   Review of Systems No leg weakness No groin sensation changes    Objective:   Physical Exam Constitutional:      Appearance: Normal appearance.  Musculoskeletal:     Comments: Good back flexion SLR negative No back tenderness Normal hip ROM  Neurological:     Mental Status: He is alert.     Comments: Normal gait No leg weakness            Assessment & Plan:

## 2022-10-29 DIAGNOSIS — M9902 Segmental and somatic dysfunction of thoracic region: Secondary | ICD-10-CM | POA: Diagnosis not present

## 2022-10-29 DIAGNOSIS — M9901 Segmental and somatic dysfunction of cervical region: Secondary | ICD-10-CM | POA: Diagnosis not present

## 2022-10-29 DIAGNOSIS — M545 Low back pain, unspecified: Secondary | ICD-10-CM | POA: Diagnosis not present

## 2022-10-29 DIAGNOSIS — M9903 Segmental and somatic dysfunction of lumbar region: Secondary | ICD-10-CM | POA: Diagnosis not present

## 2022-11-04 DIAGNOSIS — M9902 Segmental and somatic dysfunction of thoracic region: Secondary | ICD-10-CM | POA: Diagnosis not present

## 2022-11-04 DIAGNOSIS — M9901 Segmental and somatic dysfunction of cervical region: Secondary | ICD-10-CM | POA: Diagnosis not present

## 2022-11-04 DIAGNOSIS — M545 Low back pain, unspecified: Secondary | ICD-10-CM | POA: Diagnosis not present

## 2022-11-04 DIAGNOSIS — M9903 Segmental and somatic dysfunction of lumbar region: Secondary | ICD-10-CM | POA: Diagnosis not present

## 2022-11-07 DIAGNOSIS — M9902 Segmental and somatic dysfunction of thoracic region: Secondary | ICD-10-CM | POA: Diagnosis not present

## 2022-11-07 DIAGNOSIS — M9903 Segmental and somatic dysfunction of lumbar region: Secondary | ICD-10-CM | POA: Diagnosis not present

## 2022-11-07 DIAGNOSIS — M9901 Segmental and somatic dysfunction of cervical region: Secondary | ICD-10-CM | POA: Diagnosis not present

## 2022-11-07 DIAGNOSIS — M545 Low back pain, unspecified: Secondary | ICD-10-CM | POA: Diagnosis not present

## 2022-12-02 DIAGNOSIS — M9902 Segmental and somatic dysfunction of thoracic region: Secondary | ICD-10-CM | POA: Diagnosis not present

## 2022-12-02 DIAGNOSIS — M545 Low back pain, unspecified: Secondary | ICD-10-CM | POA: Diagnosis not present

## 2022-12-02 DIAGNOSIS — M9901 Segmental and somatic dysfunction of cervical region: Secondary | ICD-10-CM | POA: Diagnosis not present

## 2022-12-02 DIAGNOSIS — M9903 Segmental and somatic dysfunction of lumbar region: Secondary | ICD-10-CM | POA: Diagnosis not present

## 2022-12-08 ENCOUNTER — Other Ambulatory Visit: Payer: Self-pay | Admitting: Cardiovascular Disease

## 2023-01-02 ENCOUNTER — Other Ambulatory Visit: Payer: Self-pay | Admitting: Internal Medicine

## 2023-01-02 DIAGNOSIS — Z1212 Encounter for screening for malignant neoplasm of rectum: Secondary | ICD-10-CM

## 2023-01-02 DIAGNOSIS — Z1211 Encounter for screening for malignant neoplasm of colon: Secondary | ICD-10-CM

## 2023-01-28 ENCOUNTER — Telehealth: Payer: Self-pay

## 2023-01-28 ENCOUNTER — Telehealth: Payer: Self-pay | Admitting: Cardiology

## 2023-01-28 DIAGNOSIS — I7121 Aneurysm of the ascending aorta, without rupture: Secondary | ICD-10-CM

## 2023-01-28 NOTE — Telephone Encounter (Signed)
Wife is calling wanting labs orders put in. Please advise

## 2023-01-28 NOTE — Telephone Encounter (Signed)
Left message requesting for patient to call back RE: lab order. Patient is scheduled for lab draw tomorrow however there are no orders in EPIC. Calling to ask patient if he has the order and to advise him to bring it with him.

## 2023-01-28 NOTE — Telephone Encounter (Signed)
Attempted to call patient with no answer. Left message to call back.

## 2023-01-29 ENCOUNTER — Other Ambulatory Visit: Payer: Self-pay

## 2023-01-29 NOTE — Telephone Encounter (Signed)
Spoke with pt's wife, Jose Edwards (ok per [facility]) regarding pt's upcoming appointment with Joni Reining, NP and if pt needed labs prior to this appointment. Upon review of pt's chart, it doesn't look like pt needs any follow up lab work, however we discussed the need for CTA chest/abd/pelvis. Per Dr. Elissa Hefty last note they were planning to get this in Aug/Sept of this year. Advised that having this scan prior to office visit would be helpful and results could be discussed at that time. Number for central scheduling given to wife, she plans to call to get CTA scheduled. Orders placed for BMET to be done prior to CTA and mailed to pt's home address. Office visit with Joni Reining, NP pushed to December to give time to get CTA done. Wife verbalizes understanding.

## 2023-01-29 NOTE — Telephone Encounter (Signed)
Follow Up:     Wife is returning call from yesterday.

## 2023-02-03 ENCOUNTER — Ambulatory Visit: Payer: BC Managed Care – PPO | Admitting: Adult Health

## 2023-02-04 ENCOUNTER — Other Ambulatory Visit: Payer: Self-pay

## 2023-02-04 DIAGNOSIS — I7121 Aneurysm of the ascending aorta, without rupture: Secondary | ICD-10-CM

## 2023-02-05 ENCOUNTER — Other Ambulatory Visit: Payer: Self-pay

## 2023-02-05 DIAGNOSIS — I7121 Aneurysm of the ascending aorta, without rupture: Secondary | ICD-10-CM

## 2023-02-06 ENCOUNTER — Telehealth: Payer: Self-pay | Admitting: Cardiology

## 2023-02-06 DIAGNOSIS — I25119 Atherosclerotic heart disease of native coronary artery with unspecified angina pectoris: Secondary | ICD-10-CM

## 2023-02-06 DIAGNOSIS — I7121 Aneurysm of the ascending aorta, without rupture: Secondary | ICD-10-CM

## 2023-02-06 NOTE — Telephone Encounter (Signed)
Pt's wife would like a c/b regarding possible location change for CT. They would prefer either [facility] or another place in [place]. Please advise

## 2023-02-07 LAB — BASIC METABOLIC PANEL
BUN/Creatinine Ratio: 13 (ref 10–24)
BUN: 14 mg/dL (ref 8–27)
CO2: 21 mmol/L (ref 20–29)
Calcium: 9.4 mg/dL (ref 8.6–10.2)
Chloride: 106 mmol/L (ref 96–106)
Creatinine, Ser: 1.05 mg/dL (ref 0.76–1.27)
Glucose: 113 mg/dL — ABNORMAL HIGH (ref 70–99)
Potassium: 4.1 mmol/L (ref 3.5–5.2)
Sodium: 142 mmol/L (ref 134–144)
eGFR: 77 mL/min/{1.73_m2} (ref >59)

## 2023-02-09 NOTE — Telephone Encounter (Signed)
  Wife called back and stated that they prefer to have test done at [facility] on [address] because they will take their copay

## 2023-02-09 NOTE — Telephone Encounter (Signed)
Called left message to call back  Need the reasoning why the test need to be moved - and which facility is preferred

## 2023-02-10 NOTE — Telephone Encounter (Signed)
Patient  states he would need to pay  $1100 prior to radiology  at [facility]. Patient request to change testing site to to [place] [facility]. Patient states in the past he only  had to pay co pay  RN  informed patient will cancel Ct scan at [facility].  Reorder at [facility] imaging as requested.  RN informed patient to call back the person who gave him the information about his payment before testing RN  explain to patient if testing is not completed prior to next appointment on 03/03/23. Please call and reschedule appointment. Patient verbalized understanding.

## 2023-02-11 ENCOUNTER — Ambulatory Visit: Payer: BC Managed Care – PPO

## 2023-02-12 ENCOUNTER — Encounter: Payer: Self-pay | Admitting: Cardiology

## 2023-02-13 ENCOUNTER — Ambulatory Visit
Admission: RE | Admit: 2023-02-13 | Discharge: 2023-02-13 | Disposition: A | Discharge status: Home / Self Care | Payer: BC Managed Care – PPO | Source: Ambulatory Visit | Attending: Cardiology | Admitting: Cardiology

## 2023-02-13 DIAGNOSIS — I7121 Aneurysm of the ascending aorta, without rupture: Secondary | ICD-10-CM

## 2023-02-13 DIAGNOSIS — I25119 Atherosclerotic heart disease of native coronary artery with unspecified angina pectoris: Secondary | ICD-10-CM

## 2023-02-13 MED ORDER — IOPAMIDOL (ISOVUE-370) INJECTION 76%
75.0000 mL | Freq: Once | INTRAVENOUS | Status: AC | PRN
  Administered 2023-02-13: 75 mL via INTRAVENOUS

## 2023-03-01 NOTE — Progress Notes (Unsigned)
Cardiology Office Note:  .   Date:  03/03/2023  ID:  Jose Edwards, DOB [birth year], MRN [id number] PCP: Karie Schwalbe, MD  [facility] Providers Cardiologist:  Bryan Lemma, MD   History of Present Illness: .   Jose Edwards is a 67 y.o. male with history of coronary artery disease status post STEMI of anterior lateral wall with single-vessel PCI to the diagonal branch in 2022, on Plavix, due to stop at this office visit and begin aspirin 81 mg if not already transitioned as he was due to have this done in September 2024.    Other history includes ischemic cardiomyopathy EF 40 to 45% on Farxiga, AAA, most recent CT revealing 4.0 cm in November 2024, hyperlipidemia, and hypertension.  Was last seen by Dr. Herbie Baltimore on 07/25/2022.  He was doing well from a cardiac standpoint.  Blood pressure was well-controlled but if increased was recommended to titrate losartan to 100 mg daily.  He comes today with complaints of right shoulder pain at rest.  He is starting to have some pain on the left side as well.  Prior to his MI the patient was experiencing shoulder tightness feeling like a band or rope was tied around his shoulders.  He is again experiencing some tightness in his right shoulder.  He notices it during rest.  He denies any heavy lifting pushing pulling or straining to his shoulders.  Due to the recurrence of the symptoms he has become concerned and wanted cardiology to be aware.  He has not had any change in his energy level, breathing status, or having significant chest pain at all.  He has not used nitroglycerin to see if this is helpful.  He normally puts ice on his shoulders and the symptoms do improve.  ROS: As above otherwise negative.  Studies Reviewed: .    Echocardiogram 03/28/2021  1. Left ventricular ejection fraction, by estimation, is 55 to 60%. The  left ventricle has normal function. The left ventricle has no regional  wall motion abnormalities. Left  ventricular diastolic parameters were  normal.   2. Right ventricular systolic function is normal. The right ventricular  size is normal. There is normal pulmonary artery systolic pressure. The  estimated right ventricular systolic pressure is 23.2 mmHg.   3. The mitral valve is grossly normal. Trivial mitral valve  regurgitation. No evidence of mitral stenosis.   4. The aortic valve is tricuspid. Aortic valve regurgitation is not  visualized. No aortic stenosis is present.   5. The inferior vena cava is normal in size with greater than 50%  respiratory variability, suggesting right atrial pressure of 3 mmHg.     CT angio of chest/abd/for AAA 02/13/2023 IMPRESSION: 1. Ascending thoracic aorta is borderline aneurysmal measuring up to 4.0 cm. Slightly limited examination due to pulsation artifact in the thoracic aorta. No significant change since 2022. Recommend annual imaging followup by CTA or MRA. This recommendation follows 2010 ACCF/AHA/AATS/ACR/ASA/SCA/SCAI/SIR/STS/SVM Guidelines for the Diagnosis and Management of Patients with Thoracic Aortic Disease. Circulation. 2010; 121: V253-G644. Aortic aneurysm NOS (ICD10-I71.9) 2. Negative for an aortic dissection. 3.  Aortic Atherosclerosis (ICD10-I70.0). 4. No acute abnormality in the chest, abdomen or pelvis. 5. Prostate enlargement.    Physical Exam:   VS:  BP 112/68 (BP Location: Right Arm, Patient Position: Sitting, Cuff Size: Normal)   Pulse 64   Ht 5\' 7"  (1.702 m)   Wt 220 lb 6.4 oz (100 kg)   SpO2 97%   BMI 34.52  kg/m    Wt Readings from Last 3 Encounters:  03/03/23 220 lb 6.4 oz (100 kg)  10/10/22 216 lb (98 kg)  09/11/22 213 lb (96.6 kg)    GEN: Well nourished, well developed in no acute distress NECK: No JVD; No carotid bruits CARDIAC: RRR, no murmurs, rubs, gallops RESPIRATORY:  Clear to auscultation without rales, wheezing or rhonchi  ABDOMEN: Soft, non-tender, non-distended EXTREMITIES:  No edema; No  deformity, no pain with range of motion of his right shoulder.  ASSESSMENT AND PLAN: .    Coronary artery disease: Status post STEMI of anterior lateral wall with single-vessel PCI to the diagonal in 2022.  He is starting to have some recurrent discomfort in his shoulders.  I am going to order a PET scan to evaluate for changes in perfusion as the symptoms are similar to those before he had his MI.Marland Kitchen  He can stop his Plavix right now and will start baby aspirin 81 mg daily.  I have explained the procedure and he is willing to proceed.  He is given refills on nitroglycerin.    Informed Consent   Shared Decision Making/Informed Consent The risks [chest pain, shortness of breath, cardiac arrhythmias, dizziness, blood pressure fluctuations, myocardial infarction, stroke/transient ischemic attack, nausea, vomiting, allergic reaction, radiation exposure, metallic taste sensation and life-threatening complications (estimated to be 1 in 10,000)], benefits (risk stratification, diagnosing coronary artery disease, treatment guidance) and alternatives of a cardiac PET stress test were discussed in detail with Mr. Lemery and he agrees to proceed.     2.  Hypercholesterolemia: Has recently had these drawn by primary care.  Have been reviewed by Dr. Herbie Baltimore as they were sent directly to him.  Continue him on current dose of statin therapy.  Purposeful exercise and low-cholesterol diet is recommended.  3.  Hypertension: Blood pressure today is well-controlled.  I will not make any changes on his medication regimen at this time.  Continue losartan 50 mg daily.  4.  Ischemic cardiomyopathy: Most recent echocardiogram revealed 40 to 45% EF.  He is not showing any evidence of volume overload.  Continue current regimen.  He should continue [place] as directed.  5.  History of AAA: CT scan revealed 4.0 cm.  Stable no evidence of aneurysm.  Signed, Bettey Mare. Liborio Nixon, ANP, AACC

## 2023-03-03 ENCOUNTER — Encounter: Payer: Self-pay | Admitting: Adult Health

## 2023-03-03 ENCOUNTER — Ambulatory Visit: Payer: BC Managed Care – PPO | Attending: Adult Health | Admitting: Adult Health

## 2023-03-03 VITALS — BP 112/68 | HR 64 | Ht 67.0 in | Wt 220.4 lb

## 2023-03-03 DIAGNOSIS — I10 Essential (primary) hypertension: Secondary | ICD-10-CM

## 2023-03-03 DIAGNOSIS — I43 Cardiomyopathy in diseases classified elsewhere: Secondary | ICD-10-CM

## 2023-03-03 DIAGNOSIS — I25119 Atherosclerotic heart disease of native coronary artery with unspecified angina pectoris: Secondary | ICD-10-CM | POA: Diagnosis not present

## 2023-03-03 DIAGNOSIS — E7800 Pure hypercholesterolemia, unspecified: Secondary | ICD-10-CM

## 2023-03-03 MED ORDER — ASPIRIN 81 MG PO TBEC: 81.0000 mg | DELAYED_RELEASE_TABLET | Freq: Every day | ORAL | Status: AC

## 2023-03-03 MED ORDER — NITROGLYCERIN 0.4 MG SL SUBL: 0.4000 mg | SUBLINGUAL_TABLET | SUBLINGUAL | 2 refills | Status: AC | PRN

## 2023-03-03 NOTE — Patient Instructions (Signed)
Medication Instructions:  Stop Plavix. Start Aspirin 81 mg ( Take 1 Tablet Daily). *If you need a refill on your cardiac medications before your next appointment, please call your pharmacy*   Lab Work: No labs If you have labs (blood work) drawn today and your tests are completely normal, you will receive your results only by: MyChart Message (if you have MyChart) OR A paper copy in the mail If you have any lab test that is abnormal or we need to change your treatment, we will call you to review the results.   Testing/Procedures:   Please report to Radiology at the [facility] Main Entrance 30 minutes early for your test.  [address] [place], [place] [address]                         How to Prepare for Your Cardiac PET/CT Stress Test:   Medication instructions: Do not take erectile dysfunction medications for 72 hours prior to test (sildenafil, tadalafil) Do not take nitrates (isosorbide mononitrate, Ranexa) the day before or day of test Do not take tamsulosin the day before or morning of test Hold theophylline containing medications for 12 hours. Hold Dipyridamole 48 hours prior to the test.     Diabetic Preparation: If able to eat breakfast prior to 3 hour fasting, you may take all medications, including your insulin. Do not worry if you miss your breakfast dose of insulin - start at your next meal. If you do not eat prior to 3 hour fast-Hold all diabetes (oral and insulin) medications. Patients who wear a continuous glucose monitor MUST remove the device prior to scanning.  You may take your remaining medications with water.  2. Nothing to eat or drink, except water, 3 hours prior to arrival time.   NO caffeine/decaffeinated products, or chocolate 12 hours prior to arrival. (Please note decaffeinated beverages (teas/coffees) still contain caffeine).  If you have caffeine within 12 hours prior, the test will need to be rescheduled.   3. NO perfume, cologne  or lotion on chest or abdomen area. FEMALES - Please avoid wearing dresses to this appointment.  4. Total time is 1 to 2 hours; you may want to bring reading material for the waiting time.  IF YOU THINK YOU MAY BE PREGNANT, OR ARE NURSING PLEASE INFORM THE TECHNOLOGIST.  In preparation for your appointment, medication and supplies will be purchased.  Appointment availability is limited, so if you need to cancel or reschedule, please call the Radiology Department at [phone] [facility]) OR [phone] [facility]) 24 hours in advance to avoid a cancellation fee of $100.00  What to Expect When you Arrive:  Once you arrive and check in for your appointment, you will be taken to a preparation room within the Radiology Department.  A technologist or Nurse will obtain your medical history, verify that you are correctly prepped for the exam, and explain the procedure.  Afterwards, an IV will be started in your arm and electrodes will be placed on your skin for EKG monitoring during the stress portion of the exam. Then you will be escorted to the PET/CT scanner.  There, staff will get you positioned on the scanner and obtain a blood pressure and EKG.  During the exam, you will continue to be connected to the EKG and blood pressure machines.  A small, safe amount of a radioactive tracer will be injected in your IV to obtain a series of pictures of your  heart along with an injection of a stress agent.    After your Exam:  It is recommended that you eat a meal and drink a caffeinated beverage to counter act any effects of the stress agent.  Drink plenty of fluids for the remainder of the day and urinate frequently for the first couple of hours after the exam.  Your doctor will inform you of your test results within 7-10 business days.  For more information and frequently asked questions, please visit our website: [email or web address]  For questions about your test or how to prepare for your  test, please call: Cardiac Imaging Nurse Navigators Office: [phone]    Follow-Up: At [facility], you and your health needs are our priority.  As part of our continuing mission to provide you with exceptional heart care, we have created designated Provider Care Teams.  These Care Teams include your primary Cardiologist (physician) and Advanced Practice Providers (APPs -  Physician Assistants and Nurse Practitioners) who all work together to provide you with the care you need, when you need it.  We recommend signing up for the patient portal called "MyChart".  Sign up information is provided on this After Visit Summary.  MyChart is used to connect with patients for Virtual Visits (Telemedicine).  Patients are able to view lab/test results, encounter notes, upcoming appointments, etc.  Non-urgent messages can be sent to your provider as well.   To learn more about what you can do with MyChart, go to [email or web address].    Your next appointment:   2-3 month(s)  Provider:   Bryan Lemma, MD

## 2023-04-18 ENCOUNTER — Other Ambulatory Visit: Payer: Self-pay | Admitting: Cardiovascular Disease

## 2023-04-18 ENCOUNTER — Other Ambulatory Visit: Payer: Self-pay | Admitting: Cardiology

## 2023-04-24 ENCOUNTER — Telehealth: Payer: Self-pay | Admitting: Cardiology

## 2023-04-24 ENCOUNTER — Encounter (HOSPITAL_COMMUNITY): Payer: Self-pay

## 2023-04-24 NOTE — Telephone Encounter (Signed)
Returned call to patient & advised that he needs to call [facility] at [phone] to set up a payment plan. This service will be done at [facility].

## 2023-04-24 NOTE — Telephone Encounter (Signed)
Patient was calling in to discuss over options about the procedure for 1/28. He states his portion of the procedure he has to pay, he is unable to. Please advise

## 2023-04-28 ENCOUNTER — Encounter (HOSPITAL_COMMUNITY): Payer: BC Managed Care – PPO

## 2023-05-21 ENCOUNTER — Encounter: Payer: Self-pay | Admitting: Adult Health

## 2023-05-21 ENCOUNTER — Ambulatory Visit (INDEPENDENT_AMBULATORY_CARE_PROVIDER_SITE_OTHER): Payer: Self-pay | Admitting: Adult Health

## 2023-05-21 ENCOUNTER — Other Ambulatory Visit: Payer: Self-pay

## 2023-05-21 VITALS — BP 110/60 | HR 84 | Temp 98.8°F | Ht 67.0 in | Wt 210.0 lb

## 2023-05-21 DIAGNOSIS — R0689 Other abnormalities of breathing: Secondary | ICD-10-CM

## 2023-05-21 DIAGNOSIS — J0100 Acute maxillary sinusitis, unspecified: Secondary | ICD-10-CM

## 2023-05-21 MED ORDER — DOXYCYCLINE HYCLATE 100 MG PO TABS: 100.0000 mg | ORAL_TABLET | Freq: Two times a day (BID) | ORAL | 0 refills | Status: DC

## 2023-05-21 NOTE — Progress Notes (Signed)
[facility] [address] [place], [place] [address]   Office Visit Note  Patient Name: Jose Edwards Date of Birth [id number]  Medical Record number [id number]  Date of Service: 05/21/2023  Chief Complaint  Patient presents with   Sick visit    Patient c/o cough with yellow-dark green mucus production (worse in mornings and at night), wheezing, and sore throat "from coughing". Symptoms began a week ago and have been worsening over time. Initially he had nasal congestion as well but he states his nose recently started running. Cough has been waking him up at night. He took some of his wife's prescription cough medication and Tylenol.     HPI Pt is here for a sick visit. Pt reports coughing for week.  The last few days have become worse and its productive.  He intermittently felt feverish, and has some sore throat from coughing.    Current Medication:  Outpatient Encounter Medications as of 05/21/2023  Medication Sig   aspirin EC 81 MG tablet Take 1 tablet (81 mg total) by mouth daily. Swallow whole.   atorvastatin (LIPITOR) 80 MG tablet TAKE ONE TABLET EVERY DAY   doxycycline (VIBRA-TABS) 100 MG tablet Take 1 tablet (100 mg total) by mouth 2 (two) times daily.   FARXIGA 10 MG TABS tablet TAKE 1 TABLET BY MOUTH DAILY BEFORE BREAKFAST   fluticasone (FLONASE) 50 MCG/ACT nasal spray Place 2 sprays into both nostrils daily. (Patient taking differently: Place 2 sprays into both nostrils daily as needed.)   losartan (COZAAR) 50 MG tablet Take 1 tablet (50 mg total) by mouth daily.   metoprolol succinate (TOPROL-XL) 50 MG 24 hr tablet TAKE 1 TABLET BY MOUTH DAILY WITH OR IMMEDIATELY FOLLOWING A MEAL   nitroGLYCERIN (NITROSTAT) 0.4 MG SL tablet Place 1 tablet (0.4 mg total) under the tongue every 5 (five) minutes as needed for chest pain.   vitamin B-12 (CYANOCOBALAMIN) 100 MCG tablet Take 100 mcg by mouth every other day.    [DISCONTINUED] ciprofloxacin (CILOXAN) 0.3 %  ophthalmic solution Place 1 drop into the left eye every 2 (two) hours. Administer 1 drop, every 2 hours, while awake, for 2 days. Then 1 drop, every 4 hours, while awake, for the next 5 days.   No facility-administered encounter medications on file as of 05/21/2023.      Medical History: Past Medical History:  Diagnosis Date   Acute ST elevation myocardial infarction (STEMI) of anterolateral wall (HCC) 11/26/2020   100% D1 - DES PCI 2.25 x 16 (2.4 mm)   Benign positional vertigo    CAD S/P DES PCI D1  11/27/2020   100% D1 (DES PCI with Onyx Frontier DES 2.25 x 18 - deployed to 2.4 mm)   Coronary artery disease involving native coronary artery of native heart with angina pectoris (HCC) 11/26/2020   8/29 (Ant-lat STEMI): Cath => Culprit lesion-100% D1 (DES PCI with Onyx Frontier DES 2.25 x 18 - deployed to 2.4 mm). Mid LM 30%, Ost-prox LAD 30% & RI 30%.     ED (erectile dysfunction)    GERD (gastroesophageal reflux disease)    Osteoarthrosis, unspecified whether generalized or localized, unspecified site      Vital Signs: BP 110/60   Pulse 84   Temp 98.8 F (37.1 C)   Ht 5\' 7"  (1.702 m)   Wt 210 lb (95.3 kg)   SpO2 96%   BMI 32.89 kg/m    Review of Systems  Constitutional:  Positive for fever. Negative  for chills and fatigue.  HENT:  Positive for congestion and sore throat.   Respiratory:  Positive for cough and chest tightness.   Cardiovascular:  Negative for chest pain.  Gastrointestinal:  Negative for diarrhea, nausea and vomiting.    Physical Exam Vitals reviewed.  Constitutional:      Appearance: Normal appearance.  HENT:     Head: Normocephalic.     Right Ear: Tympanic membrane and ear canal normal.     Left Ear: Tympanic membrane and ear canal normal.     Nose:     Right Sinus: Frontal sinus tenderness present.     Left Sinus: Frontal sinus tenderness present.  Eyes:     Pupils: Pupils are equal, round, and reactive to light.  Cardiovascular:     Rate and  Rhythm: Normal rate.  Pulmonary:     Effort: Pulmonary effort is normal.     Breath sounds: Rhonchi present.  Lymphadenopathy:     Cervical: No cervical adenopathy.  Neurological:     Mental Status: He is alert.    Assessment/Plan: 1. Acute non-recurrent maxillary sinusitis (Primary) Take doxy as prescribed. Follow up via MyChart messenger if symptoms fail to improve or may return to clinic as needed for worsening symptoms.   - doxycycline (VIBRA-TABS) 100 MG tablet; Take 1 tablet (100 mg total) by mouth 2 (two) times daily.  Dispense: 20 tablet; Refill: 0  2. Decreased breath sounds Take antibiotics, use other otc meds as discussed.   We did talk about steroids, but decided to give this a few days.         General Counseling: Jose Edwards verbalizes understanding of the findings of todays visit and agrees with plan of treatment. I have discussed any further diagnostic evaluation that may be needed or ordered today. We also reviewed his medications today. he has been encouraged to call the office with any questions or concerns that should arise related to todays visit.   No orders of the defined types were placed in this encounter.   Meds ordered this encounter  Medications   doxycycline (VIBRA-TABS) 100 MG tablet    Sig: Take 1 tablet (100 mg total) by mouth 2 (two) times daily.    Dispense:  20 tablet    Refill:  0    Time spent:15 Minutes    Johnna Acosta AGNP-C Nurse Practitioner

## 2023-06-05 ENCOUNTER — Encounter: Payer: Self-pay | Admitting: Adult Health

## 2023-06-05 ENCOUNTER — Ambulatory Visit: Admitting: Adult Health

## 2023-06-05 ENCOUNTER — Other Ambulatory Visit: Payer: Self-pay

## 2023-06-05 VITALS — BP 120/70 | HR 73 | Temp 96.5°F | Ht 67.0 in | Wt 210.0 lb

## 2023-06-05 DIAGNOSIS — R051 Acute cough: Secondary | ICD-10-CM

## 2023-06-05 MED ORDER — AZITHROMYCIN 250 MG PO TABS: ORAL_TABLET | ORAL | 0 refills | Status: AC

## 2023-06-05 MED ORDER — PREDNISONE 10 MG PO TABS: ORAL_TABLET | ORAL | 0 refills | Status: AC

## 2023-06-05 NOTE — Progress Notes (Signed)
 [facility] [address] [place], [place] [address]   Office Visit Note  Patient Name: Jose Edwards Date of Birth [id number]  Medical Record number [id number]  Date of Service: 06/05/2023  Chief Complaint  Patient presents with   Cough    Patient completed antibiotics but states he still has an intermittent cough and wheezing when he lays down on his side.      HPI Pt is here for a sick visit. He reports ongoing cough from previous visit.  See ov note.  He is now hearing wheezing at times.  He denies fever, chills. He completed a course of Doxycycline, and about 3 days later it started getting worse again.    Current Medication:  Outpatient Encounter Medications as of 06/05/2023  Medication Sig   aspirin EC 81 MG tablet Take 1 tablet (81 mg total) by mouth daily. Swallow whole.   atorvastatin (LIPITOR) 80 MG tablet TAKE ONE TABLET EVERY DAY   azithromycin (ZITHROMAX) 250 MG tablet Take 2 tablets on day 1, then 1 tablet daily on days 2 through 5   FARXIGA 10 MG TABS tablet TAKE 1 TABLET BY MOUTH DAILY BEFORE BREAKFAST   fluticasone (FLONASE) 50 MCG/ACT nasal spray Place 2 sprays into both nostrils daily. (Patient taking differently: Place 2 sprays into both nostrils daily as needed.)   losartan (COZAAR) 50 MG tablet Take 1 tablet (50 mg total) by mouth daily.   metoprolol succinate (TOPROL-XL) 50 MG 24 hr tablet TAKE 1 TABLET BY MOUTH DAILY WITH OR IMMEDIATELY FOLLOWING A MEAL   nitroGLYCERIN (NITROSTAT) 0.4 MG SL tablet Place 1 tablet (0.4 mg total) under the tongue every 5 (five) minutes as needed for chest pain.   predniSONE (DELTASONE) 10 MG tablet Take 6 tablets (60 mg total) by mouth daily with breakfast for 1 day, THEN 5 tablets (50 mg total) daily with breakfast for 1 day, THEN 4 tablets (40 mg total) daily with breakfast for 1 day, THEN 3 tablets (30 mg total) daily with breakfast for 1 day, THEN 2 tablets (20 mg total) daily with breakfast for 1 day, THEN 1  tablet (10 mg total) daily with breakfast for 1 day.   vitamin B-12 (CYANOCOBALAMIN) 100 MCG tablet Take 100 mcg by mouth every other day.    [DISCONTINUED] doxycycline (VIBRA-TABS) 100 MG tablet Take 1 tablet (100 mg total) by mouth 2 (two) times daily.   No facility-administered encounter medications on file as of 06/05/2023.      Medical History: Past Medical History:  Diagnosis Date   Acute ST elevation myocardial infarction (STEMI) of anterolateral wall (HCC) 11/26/2020   100% D1 - DES PCI 2.25 x 16 (2.4 mm)   Benign positional vertigo    CAD S/P DES PCI D1  11/27/2020   100% D1 (DES PCI with Onyx Frontier DES 2.25 x 18 - deployed to 2.4 mm)   Coronary artery disease involving native coronary artery of native heart with angina pectoris (HCC) 11/26/2020   8/29 (Ant-lat STEMI): Cath => Culprit lesion-100% D1 (DES PCI with Onyx Frontier DES 2.25 x 18 - deployed to 2.4 mm). Mid LM 30%, Ost-prox LAD 30% & RI 30%.     ED (erectile dysfunction)    GERD (gastroesophageal reflux disease)    Osteoarthrosis, unspecified whether generalized or localized, unspecified site      Vital Signs: BP 120/70   Pulse 73   Temp (!) 96.5 F (35.8 C)   Ht 5\' 7"  (1.702 m)  Wt 210 lb (95.3 kg)   SpO2 98%   BMI 32.89 kg/m    Review of Systems  Constitutional:  Negative for chills, fatigue and fever.  HENT:  Negative for congestion, sinus pressure, sinus pain and sore throat.   Eyes:  Negative for pain and itching.  Respiratory:  Positive for cough and wheezing.   Cardiovascular:  Negative for chest pain.  Gastrointestinal:  Negative for diarrhea, nausea and vomiting.    Physical Exam Vitals reviewed.  Constitutional:      Appearance: Normal appearance.  HENT:     Head: Normocephalic.  Pulmonary:     Effort: Pulmonary effort is normal.     Breath sounds: Wheezing and rhonchi present.  Neurological:     Mental Status: He is alert.    Assessment/Plan: 1. Acute cough (Primary) Take  prednisone and zpak as discussed. Follow up via MyChart messenger if symptoms fail to improve or may return to clinic as needed for worsening symptoms.   - predniSONE (DELTASONE) 10 MG tablet; Take 6 tablets (60 mg total) by mouth daily with breakfast for 1 day, THEN 5 tablets (50 mg total) daily with breakfast for 1 day, THEN 4 tablets (40 mg total) daily with breakfast for 1 day, THEN 3 tablets (30 mg total) daily with breakfast for 1 day, THEN 2 tablets (20 mg total) daily with breakfast for 1 day, THEN 1 tablet (10 mg total) daily with breakfast for 1 day.  Dispense: 21 tablet; Refill: 0 - azithromycin (ZITHROMAX) 250 MG tablet; Take 2 tablets on day 1, then 1 tablet daily on days 2 through 5  Dispense: 6 tablet; Refill: 0     General Counseling: Jose Edwards verbalizes understanding of the findings of todays visit and agrees with plan of treatment. I have discussed any further diagnostic evaluation that may be needed or ordered today. We also reviewed his medications today. he has been encouraged to call the office with any questions or concerns that should arise related to todays visit.   No orders of the defined types were placed in this encounter.   Meds ordered this encounter  Medications   predniSONE (DELTASONE) 10 MG tablet    Sig: Take 6 tablets (60 mg total) by mouth daily with breakfast for 1 day, THEN 5 tablets (50 mg total) daily with breakfast for 1 day, THEN 4 tablets (40 mg total) daily with breakfast for 1 day, THEN 3 tablets (30 mg total) daily with breakfast for 1 day, THEN 2 tablets (20 mg total) daily with breakfast for 1 day, THEN 1 tablet (10 mg total) daily with breakfast for 1 day.    Dispense:  21 tablet    Refill:  0   azithromycin (ZITHROMAX) 250 MG tablet    Sig: Take 2 tablets on day 1, then 1 tablet daily on days 2 through 5    Dispense:  6 tablet    Refill:  0    Time spent:15 Minutes    Johnna Acosta AGNP-C Nurse Practitioner

## 2023-06-12 ENCOUNTER — Ambulatory Visit (INDEPENDENT_AMBULATORY_CARE_PROVIDER_SITE_OTHER): Payer: Self-pay | Admitting: Adult Health

## 2023-06-12 DIAGNOSIS — R051 Acute cough: Secondary | ICD-10-CM

## 2023-06-12 NOTE — Progress Notes (Signed)
 Spoke with patient on phone for update.  He was seen one week ago for Cough, prescribed steroids and Zpak.  He continues to hear some "rough" sounds in his lungs with breathing, often at night.   Plan is to wait till Monday, and if symptoms don't resolve, get CXR then.

## 2023-06-18 ENCOUNTER — Other Ambulatory Visit

## 2023-06-18 DIAGNOSIS — I25119 Atherosclerotic heart disease of native coronary artery with unspecified angina pectoris: Secondary | ICD-10-CM

## 2023-06-18 DIAGNOSIS — I10 Essential (primary) hypertension: Secondary | ICD-10-CM

## 2023-06-19 LAB — BASIC METABOLIC PANEL
BUN/Creatinine Ratio: 14 (ref 10–24)
BUN: 13 mg/dL (ref 8–27)
CO2: 22 mmol/L (ref 20–29)
Calcium: 9 mg/dL (ref 8.6–10.2)
Chloride: 106 mmol/L (ref 96–106)
Creatinine, Ser: 0.95 mg/dL (ref 0.76–1.27)
Glucose: 110 mg/dL — ABNORMAL HIGH (ref 70–99)
Potassium: 4.4 mmol/L (ref 3.5–5.2)
Sodium: 140 mmol/L (ref 134–144)
eGFR: 87 mL/min/{1.73_m2} (ref >59)

## 2023-06-22 ENCOUNTER — Ambulatory Visit: Payer: BC Managed Care – PPO | Attending: Cardiology | Admitting: Cardiology

## 2023-06-22 VITALS — BP 120/60 | HR 79 | Ht 68.0 in | Wt 221.0 lb

## 2023-06-22 DIAGNOSIS — I25119 Atherosclerotic heart disease of native coronary artery with unspecified angina pectoris: Secondary | ICD-10-CM | POA: Diagnosis not present

## 2023-06-22 DIAGNOSIS — I2109 ST elevation (STEMI) myocardial infarction involving other coronary artery of anterior wall: Secondary | ICD-10-CM | POA: Diagnosis not present

## 2023-06-22 DIAGNOSIS — I8393 Asymptomatic varicose veins of bilateral lower extremities: Secondary | ICD-10-CM

## 2023-06-22 DIAGNOSIS — E785 Hyperlipidemia, unspecified: Secondary | ICD-10-CM

## 2023-06-22 DIAGNOSIS — I10 Essential (primary) hypertension: Secondary | ICD-10-CM

## 2023-06-22 DIAGNOSIS — I255 Ischemic cardiomyopathy: Secondary | ICD-10-CM | POA: Diagnosis not present

## 2023-06-22 DIAGNOSIS — I2510 Atherosclerotic heart disease of native coronary artery without angina pectoris: Secondary | ICD-10-CM

## 2023-06-22 DIAGNOSIS — I7121 Aneurysm of the ascending aorta, without rupture: Secondary | ICD-10-CM

## 2023-06-22 DIAGNOSIS — E669 Obesity, unspecified: Secondary | ICD-10-CM

## 2023-06-22 DIAGNOSIS — I77810 Thoracic aortic ectasia: Secondary | ICD-10-CM

## 2023-06-22 NOTE — Progress Notes (Unsigned)
 Cardiology Office Note:  .   Date:  06/23/2023  ID:  Jose Edwards, DOB 06/30/1955, MRN [id number] PCP: Jose Schwalbe, MD  [facility] Providers Cardiologist:  Jose Lemma, MD     No chief complaint on file.   Patient Profile: .     Jose Edwards is a 68 y.o. male with a PMH notable for CAD-Anterolateral STEMI-PCI D1 (2022) with resolved ICM who presents here for 86-month follow-up at the request of Jose Schwalbe, MD.  Pertinent PMH  CAD-Anterior STEMI 11/26/2020 (D1 PCI) with resolved ICM,  11/26/2020 -Anterolateral STEMI. Chest & Back pain off and on => Anterolateral STEMI ->  CTA chest excluded thoracic aortic dissection (mild dilation)  Cath - PCI-D1.11/26/2020 => EF 40-45%.  Mod HK of Apical Anterior wall.;   Echo: EF 40 to 45%, mild concentric LVH w/ GRII DD-mild atrial left dilation.,  Apical anterior and apical segment hypokinetic.  Remainder normal. Echo 03/28/2021:  EF 55-60%. No RWMA.  Normal Valves. => Resolved ICM Dilated Thoracic Aorta (4.1 cm)  CRFs: HTN, HLD; history of prediabetes History of varicose veins   I last Jose Edwards on July 25, 2022-was doing well from a cardiac standpoint.  He was last seen on March 03, 2023 by Jose Reining, NP with complaints of right shoulder pain somewhat similar to what he had at the time of his MI.  He was restless during rest.  Was somewhat concerned and wanted cardiology evaluation.  No change in energy level or breathing status.  No significant chest pain.  Had not used nitroglycerin.  Cardiac Stress PET was ordered but likely not approved by insurance company.  Subjective  Discussed the use of AI scribe software for clinical note transcription with the patient, who gave verbal consent to proceed.  History of Present Illness   Jose Edwards is a 68 year old male ([place] [employer]!!) with coronary artery disease, HTN & HLD who presents for a four-month follow-up.  He  experienced a myocardial infarction in August 2022 and has been stable since then. He is currently taking aspirin 81 mg, atorvastatin 80 mg, Farxiga 10 mg for diabetes, losartan 50 mg, and Toprol 50 mg. No muscle aches or cramping and has not needed to use nitroglycerin recently. His cholesterol levels were last checked in May 2024 and were within normal limits.   He has not experienced any recurrence of shoulder pain, chest pain, or shortness of breath since his last visit. He is able to climb stairs without difficulty and does not experience palpitations, dizziness, or syncope. No new swelling in his legs, although he notes some swelling in one leg due to varicose veins.  He has a history of a dilated thoracic aorta, which has been stable since 2022. The dilation is not considered aneurysmal.  He mentions occasional epistaxis, which he attributes to sinus issues and states that this is not a new symptom.       Remains active with no angina or heart failure symptoms.  No arrhythmia symptoms.  Objective   Medications - Aspirin 81 mg:- Nitroglycerin as needed;- Atorvastatin 80 mg - Losartan 50 mg- Toprol 50 mg - Farxiga 10 mg daily -Flonase 2 sprays daily   Studies Reviewed: Jose Edwards Kitchen   EKG Interpretation Date/Time:  Monday June 22 2023 09:08:17 EDT Ventricular Rate:  79 PR Interval:  156 QRS Duration:  80 QT Interval:  394 QTC Calculation: 451 R Axis:   -6  Text Interpretation: Normal sinus rhythm  Low voltage QRS Septal infarct (cited on or before 26-Nov-2020) When compared with ECG of 27-Nov-2020 06:38, ST no longer elevated in Anterior leads Nonspecific T wave abnormality, improved in Lateral leads Confirmed by Jose Edwards ([id number]) on 06/22/2023 9:13:52 AM    CTA Aorta: 1. Ascending thoracic aorta is DILATED measuring up to 4.0 cm. Slightly limited examination due to pulsation artifact in the thoracic aorta. No significant change since 2022. 2. Negative for an aortic dissection.3.   Aortic Atherosclerosis (ICD10-I70.0).4. No acute abnormality in the chest, abdomen or pelvis.5. Prostate enlargement. Cardiac Cath-PCI 11/26/2020: (Culprit) D1 100% thrombotic. ->  DES PCI (Onyx Frontier DES 2.25 mm x 18 mm); Ostial-mid LM 30%.  RI 30%. ostial-proximal LAD 30%.  (cath films personally reviewed)     Lab Results  Component Value Date   CHOL 103 08/07/2022   HDL 39 (L) 08/07/2022   LDLCALC 51 08/07/2022   TRIG 57 08/07/2022   CHOLHDL 2.7 12/13/2021   Lab Results  Component Value Date   NA 140 06/18/2023   K 4.4 06/18/2023   CREATININE 0.95 06/18/2023   EGFR 87 06/18/2023   GLUCOSE 110 (H) 06/18/2023   Lab Results  Component Value Date   HGBA1C CANCELED 08/07/2022   Risk Assessment/Calculations:            Physical Exam:   VS:  BP 120/60 (BP Location: Left Arm, Patient Position: Sitting, Cuff Size: Large)   Pulse 79   Ht 5\' 8"  (1.727 m)   Wt 221 lb (100.2 kg)   SpO2 96%   BMI 33.60 kg/m    Wt Readings from Last 3 Encounters:  06/22/23 221 lb (100.2 kg)  06/05/23 210 lb (95.3 kg)  05/21/23 210 lb (95.3 kg)    GEN: Well nourished, well developed in no acute distress; mildly obese but otherwise well-groomed.  Healthy-appearing. NECK: No JVD; No carotid bruits CARDIAC: Normal S1, S2; RRR, 1/6 SEM at RUSB-next.  Otherwise no additional murmurs, rubs, gallops RESPIRATORY:  Clear to auscultation without rales, wheezing or rhonchi ; nonlabored, good air movement. ABDOMEN: Soft, non-tender, non-distended EXTREMITIES: Trace bilateral edema; No deformity ; chronic mild L>RLE edema with varicose and spider veins.  Mild bruising   ASSESSMENT AND PLAN: .    Problem List Items Addressed This Visit       Cardiology Problems   Coronary artery disease involving native coronary artery of native heart with angina pectoris (HCC) - Primary (Chronic)   CAD stable post-myocardial infarction.  No angina, dyspnea, or palpitations.  EKG stable, BP controlled, excellent  cholesterol levels.  Current medication regimen effective. - Continue aspirin 81 mg daily. - Continue atorvastatin 80 mg daily. - Continue losartan 50 mg daily. - Continue metoprolol 50 mg daily. - Follow up in November 2025.      Relevant Orders   EKG 12-Lead (Completed)   Dilatation of thoracic aorta (HCC) (Chronic)   Thoracic aorta dilation stable at 40-41 mm, not aneurysmal. No intervention needed unless dilation reaches 46-48 mm. - Recheck thoracic aorta in 2027 if stable.      Relevant Orders   EKG 12-Lead (Completed)   Essential hypertension (Chronic)   Hypertension well-controlled with current medication. - Continue losartan 50 mg daily. - Continue metoprolol 50 mg daily.      Relevant Orders   EKG 12-Lead (Completed)   Hyperlipidemia with target low density lipoprotein (LDL) cholesterol less than 55 mg/dL (Chronic)   Most recent labs showed an LDL of 51 on current dose of the  80 mg Lipitor.  Tolerating well with no major myalgias. - Continue atorvastatin 80 mg daily. -Labs followed by PCP.  Prediabetes-most recent A1c 5.6 on Farxiga. - Continue Farxiga 10 mg daily. - Monitor A1c annually.      Relevant Orders   EKG 12-Lead (Completed)   Ischemic cardiomyopathy - EF 40-45% post Ant-Lat STEMI (Chronic)   No active heart failure symptoms.  Euvolemic on exam.  On ARB and beta-blocker along with the [place].  No diuretic requirement.      Relevant Orders   EKG 12-Lead (Completed)   ST elevation myocardial infarction (STEMI) of anterolateral wall (HCC) (Chronic)   2 and half years out from MI.  Small diagonal branch treated with DES stent.  Otherwise per much asymptomatic with no significant CAD elsewhere.  On stable regimen.  Completed 24 hours of Plavix now back on aspirin, statin and beta-blocker, ARB along with [place].      Relevant Orders   EKG 12-Lead (Completed)   Varicose veins of both lower extremities (Chronic)   Swelling in one leg due to varicose  veins. - Advise leg elevation during the day and at night.        Other   Obesity (BMI 30-39.9) (Chronic)   Somewhat labile weights.  Although the weight today seems similar to what it was 6 months ago. BMI is 33.6.  Discussed importance of dietary modification and increased exercise.      Relevant Orders   EKG 12-Lead (Completed)    Recording duration: 23 minutes          Follow-Up: Return in about 8 months (around 02/22/2024) for Routine follow up with me.- Follow up in November 2025.      Signed, Marykay Lex, MD, MS Jose Edwards, M.D., M.S. Interventional Cardiologist  [facility]  Pager # [phone] Phone # [phone] [address] [place], [place] [address]

## 2023-06-22 NOTE — Patient Instructions (Addendum)
 Medication Instructions:   No changes *If you need a refill on your cardiac medications before your next appointment, please call your pharmacy*   Lab Work: Not needed .   Testing/Procedures:  Not needed  Follow-Up: At [facility], you and your health needs are our priority.  As part of our continuing mission to provide you with exceptional heart care, we have created designated Provider Care Teams.  These Care Teams include your primary Cardiologist (physician) and Advanced Practice Providers (APPs -  Physician Assistants and Nurse Practitioners) who all work together to provide you with the care you need, when you need it.     Your next appointment:   8 to 9 month(s)  ( Nov - Dec 2025)  The format for your next appointment:   In Person  Provider:   Bryan Lemma, MD    Other Instructions   s

## 2023-06-23 ENCOUNTER — Encounter: Payer: Self-pay | Admitting: Cardiology

## 2023-06-23 DIAGNOSIS — I8393 Asymptomatic varicose veins of bilateral lower extremities: Secondary | ICD-10-CM | POA: Insufficient documentation

## 2023-06-23 NOTE — Assessment & Plan Note (Signed)
 Swelling in one leg due to varicose veins. - Advise leg elevation during the day and at night.

## 2023-06-23 NOTE — Assessment & Plan Note (Signed)
 2 and half years out from MI.  Small diagonal branch treated with DES stent.  Otherwise per much asymptomatic with no significant CAD elsewhere.  On stable regimen.  Completed 24 hours of Plavix now back on aspirin, statin and beta-blocker, ARB along with [place].

## 2023-06-23 NOTE — Assessment & Plan Note (Signed)
 CAD stable post-myocardial infarction.  No angina, dyspnea, or palpitations.  EKG stable, BP controlled, excellent cholesterol levels.  Current medication regimen effective. - Continue aspirin 81 mg daily. - Continue atorvastatin 80 mg daily. - Continue losartan 50 mg daily. - Continue metoprolol 50 mg daily. - Follow up in November 2025.

## 2023-06-23 NOTE — Assessment & Plan Note (Signed)
 No active heart failure symptoms.  Euvolemic on exam.  On ARB and beta-blocker along with the [place].  No diuretic requirement.

## 2023-06-23 NOTE — Assessment & Plan Note (Signed)
 Thoracic aorta dilation stable at 40-41 mm, not aneurysmal. No intervention needed unless dilation reaches 46-48 mm. - Recheck thoracic aorta in 2027 if stable.

## 2023-06-23 NOTE — Assessment & Plan Note (Signed)
 Hypertension well-controlled with current medication. - Continue losartan 50 mg daily. - Continue metoprolol 50 mg daily.

## 2023-06-23 NOTE — Assessment & Plan Note (Addendum)
 Most recent labs showed an LDL of 51 on current dose of the 80 mg Lipitor.  Tolerating well with no major myalgias. - Continue atorvastatin 80 mg daily. -Labs followed by PCP.  Prediabetes-most recent A1c 5.6 on Farxiga. - Continue Farxiga 10 mg daily. - Monitor A1c annually.

## 2023-06-23 NOTE — Assessment & Plan Note (Signed)
 Somewhat labile weights.  Although the weight today seems similar to what it was 6 months ago. BMI is 33.6.  Discussed importance of dietary modification and increased exercise.

## 2023-10-15 ENCOUNTER — Ambulatory Visit (INDEPENDENT_AMBULATORY_CARE_PROVIDER_SITE_OTHER): Payer: Self-pay | Admitting: Medical

## 2023-10-15 VITALS — BP 121/71 | HR 58

## 2023-10-15 DIAGNOSIS — I10 Essential (primary) hypertension: Secondary | ICD-10-CM

## 2023-10-15 NOTE — Progress Notes (Signed)
 Patient not seen by provider today. Requests blood pressure check only. He is followed by cardiology for CAD, hypertension, thoracic aortic dilatation and cardiomyopathy.

## 2023-12-09 ENCOUNTER — Other Ambulatory Visit: Payer: Self-pay | Admitting: Cardiovascular Disease

## 2023-12-29 ENCOUNTER — Other Ambulatory Visit: Payer: Self-pay | Admitting: Cardiology

## 2024-02-15 ENCOUNTER — Other Ambulatory Visit: Payer: Self-pay | Admitting: Cardiovascular Disease

## 2024-03-02 ENCOUNTER — Telehealth: Payer: Self-pay | Admitting: Cardiology

## 2024-03-02 DIAGNOSIS — E785 Hyperlipidemia, unspecified: Secondary | ICD-10-CM

## 2024-03-02 DIAGNOSIS — I10 Essential (primary) hypertension: Secondary | ICD-10-CM

## 2024-03-02 DIAGNOSIS — I255 Ischemic cardiomyopathy: Secondary | ICD-10-CM

## 2024-03-02 DIAGNOSIS — E7800 Pure hypercholesterolemia, unspecified: Secondary | ICD-10-CM

## 2024-03-02 NOTE — Telephone Encounter (Signed)
 Pt is asking his lab orders be faxed to [phone]. [facility]  If fax does not go through he says to call [place] [phone]

## 2024-03-02 NOTE — Telephone Encounter (Signed)
 Routed to [employer] No active lab orders

## 2024-03-04 NOTE — Telephone Encounter (Signed)
 Faexd labslip as request   Patient is aware, but patient states he will be seeing Dr Anner on 03/07/24. RN instructed patient to come to appt - fasting - take medication and drink water after his appt he can have labs completed   Patient verbalized understadning

## 2024-03-05 NOTE — Progress Notes (Unsigned)
 Cardiology Office Note:  .   Date:  03/07/2024  ID:  Jose Edwards, DOB 10/30/1955, MRN [id number] PCP: Tramond Charlie FERNS, MD  [facility] Providers Cardiologist:  Alm Clay, MD     Chief Complaint  Patient presents with   Follow-up    63-month follow-up.  No major issues.   Coronary Artery Disease    No angina    Patient Profile: .     Jose Edwards is a  68 y.o. male  with a PMH notable for CAD-Anterolateral STEMI-PCI D1 (2022) who presents here for 67-month follow-up at the request of Takao Charlie FERNS, MD.  Pertinent PMH  CAD-Anterior STEMI 11/26/2020 (D1 PCI) with resolved ICM,  11/26/2020 -Anterolateral STEMI. Chest & Back pain off and on => Anterolateral STEMI ->  CTA chest excluded thoracic aortic dissection (mild dilation)  Cath - PCI-D1 (11/26/2020) => EF 40-45%.  Mod HK of Apical Anterior wall.;   Echo 03/28/2021:  EF 55-60%. No RWMA.  Normal Valves. => Resolved ICM Dilated Thoracic Aorta (4.1 cm)  CRFs: HTN, HLD; history of prediabetes History of varicose veins     I last saw Rutha Storm [place] on June 22, 2023: No further episodes of his anginal equivalent (shoulder/chest pain) or exertional dyspnea.  No issues climbing stairs or with exercise.  No syncope/near syncope.  No PND, orthopnea but does have some lower extremity edema from varicose veins.  Occasional epistaxis. => No changes made.  Plan was to recheck thoracic aorta in 2027.  Recommended foot elevation and discussed dietary modifications.   We recently called him on December 3 to ask about having labs checked prior to his visit.  Subjective  Discussed the use of AI scribe software for clinical note transcription with the patient, who gave verbal consent to proceed.  History of Present Illness Jose Edwards is a 68 year old male with coronary artery disease who presents for a follow-up visit.  He experiences persistent swelling in his legs, particularly in the left  leg, which sometimes requires him to remove his shoe due to discomfort. The swelling worsens later in the day, especially when sitting for extended periods.  He is currently taking Lipitor  80 mg, Farxiga  10 mg, aspirin  81 mg, losartan  50 mg, and Eldepryl 50 mg. He takes losartan  and Eldepryl in the morning and evening, respectively, and takes his cholesterol medication at night. He also takes B12 and occasionally vitamin C and D, though he has reduced his vitamin D  intake after being advised it was too high.  No chest pain, heart racing, or shortness of breath when lying flat. He occasionally feels dizzy, particularly when standing up quickly, which he attributes to either circulation issues or sinus problems. He acknowledges that dehydration might contribute to this dizziness, as he sometimes forgets to drink water while busy.  There are no significant changes in his sleep patterns, using only one pillow and not waking up short of breath. He does snore, which his wife has noted, but he does not feel excessively tired during the day. He occasionally naps briefly after work but feels rested overall.  He mentions a weight gain of about 18 pounds since February, noting that his clothes have become tighter since September. He had previously lost weight over the summer, which he attributes to increased physical activity.  Cardiovascular ROS: no chest pain or dyspnea on exertion positive for - occasional dizziness - positional; L>RLE edema negative for - irregular heartbeat, loss of consciousness, orthopnea, palpitations,  paroxysmal nocturnal dyspnea, rapid heart rate, shortness of breath, or syncope/near syncope, TIA/amaurosis fugax; claudication; melena, hematochezia, hemturia, bleeding.   ROS:  Review of Systems - Negative except noted above    Objective   Medications - Aspirin  81 mg:- Nitroglycerin  as needed;- Atorvastatin  80 mg - Losartan  50 mg- Toprol  50 mg - Farxiga  10 mg daily - Flonase  2  sprays daily  [place] [employer]!!  Studies Reviewed: SABRA       Due to check Labs today! Lab Results  Component Value Date   CHOL 103 08/07/2022   HDL 39 (L) 08/07/2022   LDLCALC 51 08/07/2022   LDLDIRECT 155.1 02/27/2010   TRIG 57 08/07/2022   CHOLHDL 2.7 12/13/2021   Lab Results  Component Value Date   NA 140 06/18/2023   K 4.4 06/18/2023   CREATININE 0.95 06/18/2023   EGFR 87 06/18/2023   GLUCOSE 110 (H) 06/18/2023   Lab Results  Component Value Date   HGBA1C CANCELED 08/07/2022    Cardiac Cath-PCI 11/26/2020: (Culprit) D1 100% thrombotic. ->  DES PCI (Onyx Frontier DES 2.25 mm x 18 mm); Ostial-mid LM 30%.  RI 30%. ostial-proximal LAD 30%.  (cath films personally reviewed)      Echocardiogram (02/2021) : LVEF 55 to 60%.  No RWMA.  Normal diastolic parameters.  Normal RV size and function with normal PAP.  Normal mitral valve.  Normal aortic valve.  Normal RAP.  EF improved from previous study and read no wall motion abnormality has resolved   CTA Chest -Abd-Pelvis (02/13/2023): 1. Ascending thoracic aorta is borderline aneurysmal measuring up to 4.0 cm. Slightly limited examination due to pulsation artifact in the thoracic aorta. No significant change since 2022. Recommend annual imaging followup by CTA or MRA. This recommendation follows 2010 ACCF/AHA/AATS/ACR/ASA/SCA/SCAI/SIR/STS/SVM Guidelines for the Diagnosis and Management of Patients with Thoracic Aortic Disease. Circulation. 2010; 121: Z733-z630. Aortic aneurysm NOS (ICD10-I71.9) 2. Negative for an aortic dissection. 3.  Aortic Atherosclerosis (ICD10-I70.0). 4. No acute abnormality in the chest, abdomen or pelvis. 5. Prostate enlargement.  Risk Assessment/Calculations:             Physical Exam:   VS:  BP 120/66 (BP Location: Left Arm, Patient Position: Sitting, Cuff Size: Normal)   Pulse 74   Ht 5' 8 (1.727 m)   Wt 228 lb (103.4 kg)   SpO2 98%   BMI 34.67 kg/m    Wt Readings from Last 3  Encounters:  03/07/24 228 lb (103.4 kg)  06/22/23 221 lb (100.2 kg)  06/05/23 210 lb (95.3 kg)     GEN: Well nourished, well groomed; in no acute distress; mild-moderately obese NECK: No JVD; No carotid bruits CARDIAC: RRR, Normal S1, S2; 1/6 SEM at RUSB-neck; no rubs, gallops RESPIRATORY:  Clear to auscultation without rales, wheezing or rhonchi ; nonlabored, good air movement. ABDOMEN: Soft, non-tender, non-distended EXTREMITIES:  No edema; No deformity     ASSESSMENT AND PLAN: .    Problem List Items Addressed This Visit       Cardiology Problems   CAD S/P DES PCI D1  (Chronic)   Over 3 years out from MI with diagonal PCI.  No longer on DAPT.  He is on aspirin  maintenance SAPT. Would be okay if necessary to hold aspirin  5 to 7 days preop for surgeries or procedures.      Relevant Orders   Lipid panel   Comprehensive metabolic panel with GFR   Magnesium   Hemoglobin A1c   Coronary artery disease involving  native coronary artery of native heart with angina pectoris - Primary (Chronic)   Coronary artery disease, status post stent placement in the setting of an anterior/anterolateral MI with PCI to D1.  Wall motion abnormality and reduced EF improved.  Echocardiogram in December 2022 showed resolution of wall motion normality and EF. No recent angina or heart failure symptoms.  Symptoms.  - Continue aspirin  81 mg daily, atorvastatin  80 mg daily, and Toprol -XL 51 daily along with losartan  50 mg daily - Ordered blood work including C-met, lipid panel, and A1c - Scheduled follow-up in August 2026.      Relevant Orders   Lipid panel   Comprehensive metabolic panel with GFR   Magnesium   Hemoglobin A1c   Dilatation of thoracic aorta (Chronic)   CTA chest abdomen pelvis ordered last year showed ascending thoracic order measuring 4.0 cm.   No dissection or significant abnormalities. This is really not that significant an abnormality.  I think we probably reassess in a couple  years.      Relevant Orders   Lipid panel   Comprehensive metabolic panel with GFR   Magnesium   Hemoglobin A1c   Essential hypertension (Chronic)   Blood pressure well-controlled with losartan  and metoprolol . Occasional dizziness noted. - Continue losartan  and metoprolol  succinate both at 50 mg daily. - Increase water intake to prevent dizziness.      Relevant Orders   Lipid panel   Comprehensive metabolic panel with GFR   Magnesium   Hemoglobin A1c   Hyperlipidemia with target low density lipoprotein (LDL) cholesterol less than 55 mg/dL (Chronic)   Remains on atorvastatin  80 mg daily.  Labs were last checked in May 2024-LDL was 51.  He is due to get labs checked today. - Continue atorvastatin  for now adjust based on labs.  Weight gain may affect lipid levels. - Encouraged weight loss to improve lipid profile.      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      Relevant Orders   Lipid panel   Comprehensive metabolic panel with GFR   Magnesium   Hemoglobin A1c     Other   Hyperglycemia (Chronic)   Managed with Farxiga . A1c to be checked for current control. - Continue Farxiga . - Ordered A1c test.      Relevant Orders   Lipid panel   Comprehensive metabolic panel with GFR   Magnesium   Hemoglobin A1c   Obesity (BMI 30-39.9) (Chronic)   Weight gain of 18 pounds since  February. Discussed cardiovascular impact and importance of weight loss. - Encouraged weight loss through diet and exercise. - Recommended regular physical activity such as walking and push always from the table .             Follow-Up: Return in about 9 months (around 12/06/2024) for Routine follow up with me, [employer].  I spent 42 minutes in the care of Daine Gunther today including reviewing studies (4 minutes reviewing previous cath and echo reports and cath films), face to face time discussing treatment options (23 minutes), reviewing records from last visit- (4 minutes), 11 minutes dictating, and documenting in the encounter.      Signed, Alm MICAEL Clay, MD, MS Alm Clay, M.D., M.S. Interventional Cardiologist  [facility] Pager # [phone]

## 2024-03-05 NOTE — Assessment & Plan Note (Signed)
 Remains on atorvastatin  80 mg daily.  Labs were last checked in May 2024-LDL was 51.  He is due to get labs checked today. - Continue atorvastatin  for now adjust based on labs.  Weight gain may affect lipid levels. - Encouraged weight loss to improve lipid profile.

## 2024-03-07 ENCOUNTER — Encounter: Payer: Self-pay | Admitting: Cardiology

## 2024-03-07 ENCOUNTER — Ambulatory Visit: Attending: Cardiology | Admitting: Cardiology

## 2024-03-07 ENCOUNTER — Other Ambulatory Visit: Payer: Self-pay

## 2024-03-07 VITALS — BP 120/66 | HR 74 | Ht 68.0 in | Wt 228.0 lb

## 2024-03-07 DIAGNOSIS — I25119 Atherosclerotic heart disease of native coronary artery with unspecified angina pectoris: Secondary | ICD-10-CM | POA: Diagnosis not present

## 2024-03-07 DIAGNOSIS — E785 Hyperlipidemia, unspecified: Secondary | ICD-10-CM

## 2024-03-07 DIAGNOSIS — I2510 Atherosclerotic heart disease of native coronary artery without angina pectoris: Secondary | ICD-10-CM | POA: Diagnosis not present

## 2024-03-07 DIAGNOSIS — Z9861 Coronary angioplasty status: Secondary | ICD-10-CM

## 2024-03-07 DIAGNOSIS — R739 Hyperglycemia, unspecified: Secondary | ICD-10-CM | POA: Insufficient documentation

## 2024-03-07 DIAGNOSIS — I10 Essential (primary) hypertension: Secondary | ICD-10-CM

## 2024-03-07 DIAGNOSIS — E669 Obesity, unspecified: Secondary | ICD-10-CM

## 2024-03-07 DIAGNOSIS — I255 Ischemic cardiomyopathy: Secondary | ICD-10-CM

## 2024-03-07 DIAGNOSIS — I2109 ST elevation (STEMI) myocardial infarction involving other coronary artery of anterior wall: Secondary | ICD-10-CM | POA: Diagnosis not present

## 2024-03-07 DIAGNOSIS — I77810 Thoracic aortic ectasia: Secondary | ICD-10-CM

## 2024-03-07 DIAGNOSIS — I8393 Asymptomatic varicose veins of bilateral lower extremities: Secondary | ICD-10-CM

## 2024-03-07 LAB — COMPREHENSIVE METABOLIC PANEL WITH GFR
ALT: 28 IU/L (ref 0–44)
AST: 22 IU/L (ref 0–40)
Albumin: 4.4 g/dL (ref 3.9–4.9)
Alkaline Phosphatase: 94 IU/L (ref 47–123)
BUN/Creatinine Ratio: 11 (ref 10–24)
BUN: 11 mg/dL (ref 8–27)
Bilirubin Total: 1 mg/dL (ref 0.0–1.2)
CO2: 22 mmol/L (ref 20–29)
Calcium: 9.6 mg/dL (ref 8.6–10.2)
Chloride: 103 mmol/L (ref 96–106)
Creatinine, Ser: 1 mg/dL (ref 0.76–1.27)
Globulin, Total: 2.5 g/dL (ref 1.5–4.5)
Glucose: 106 mg/dL — ABNORMAL HIGH (ref 70–99)
Potassium: 4.6 mmol/L (ref 3.5–5.2)
Sodium: 138 mmol/L (ref 134–144)
Total Protein: 6.9 g/dL (ref 6.0–8.5)
eGFR: 81 mL/min/1.73 (ref >59)

## 2024-03-07 LAB — LIPID PANEL
Chol/HDL Ratio: 2.9 ratio (ref 0.0–5.0)
Cholesterol, Total: 134 mg/dL (ref 100–199)
HDL: 47 mg/dL (ref >39)
LDL Chol Calc (NIH): 73 mg/dL (ref 0–99)
Triglycerides: 66 mg/dL (ref 0–149)
VLDL Cholesterol Cal: 14 mg/dL (ref 5–40)

## 2024-03-07 LAB — HEMOGLOBIN A1C
Est. average glucose Bld gHb Est-mCnc: 111 mg/dL
Hgb A1c MFr Bld: 5.5 % (ref 4.8–5.6)

## 2024-03-07 LAB — MAGNESIUM: Magnesium: 2.1 mg/dL (ref 1.6–2.3)

## 2024-03-07 NOTE — Assessment & Plan Note (Signed)
 Managed with Farxiga . A1c to be checked for current control. - Continue Farxiga . - Ordered A1c test.

## 2024-03-07 NOTE — Assessment & Plan Note (Signed)
 Blood pressure well-controlled with losartan  and metoprolol . Occasional dizziness noted. - Continue losartan  and metoprolol  succinate both at 50 mg daily. - Increase water intake to prevent dizziness.

## 2024-03-07 NOTE — Assessment & Plan Note (Signed)
 Coronary artery disease, status post stent placement in the setting of an anterior/anterolateral MI with PCI to D1.  Wall motion abnormality and reduced EF improved.  Echocardiogram in December 2022 showed resolution of wall motion normality and EF. No recent angina or heart failure symptoms.  Symptoms.  - Continue aspirin  81 mg daily, atorvastatin  80 mg daily, and Toprol -XL 51 daily along with losartan  50 mg daily - Ordered blood work including C-met, lipid panel, and A1c - Scheduled follow-up in August 2026.

## 2024-03-07 NOTE — Assessment & Plan Note (Addendum)
 CTA chest abdomen pelvis ordered last year showed ascending thoracic order measuring 4.0 cm.   No dissection or significant abnormalities. This is really not that significant an abnormality.  I think we probably reassess in a couple years.

## 2024-03-07 NOTE — Assessment & Plan Note (Signed)
 Follow-up echocardiogram 4 months post MRI showed resolution of wall motion malady and normalization of EF on current dose of losartan  and Toprol  50 mg each

## 2024-03-07 NOTE — Assessment & Plan Note (Signed)
 Weight gain of 18 pounds since February. Discussed cardiovascular impact and importance of weight loss. - Encouraged weight loss through diet and exercise. - Recommended regular physical activity such as walking and push always from the table .

## 2024-03-07 NOTE — Patient Instructions (Signed)
 Medication Instructions:   NO CHANGES *If you need a refill on your cardiac medications before your next appointment, please call your pharmacy*   Lab Work: CMP MAGNESIUM LIPIDS [id number] If you have labs (blood work) drawn today and your tests are completely normal, you will receive your results only by: MyChart Message (if you have MyChart) OR A paper copy in the mail If you have any lab test that is abnormal or we need to change your treatment, we will call you to review the results.   Testing/Procedures:  Not needed  Follow-Up: At [facility], you and your health needs are our priority.  As part of our continuing mission to provide you with exceptional heart care, we have created designated Provider Care Teams.  These Care Teams include your primary Cardiologist (physician) and Advanced Practice Providers (APPs -  Physician Assistants and Nurse Practitioners) who all work together to provide you with the care you need, when you need it.     Your next appointment:    8 to9 month(s) ( Aug/Sept 2026)  The format for your next appointment:   In Person  Provider:   Alm Clay, MD

## 2024-03-07 NOTE — Assessment & Plan Note (Signed)
 Over 3 years out from MI with diagonal PCI.  No longer on DAPT.  He is on aspirin  maintenance SAPT. Would be okay if necessary to hold aspirin  5 to 7 days preop for surgeries or procedures.

## 2024-03-07 NOTE — Assessment & Plan Note (Signed)
 Continue to elevate feet and wear compression stockings.

## 2024-03-10 ENCOUNTER — Ambulatory Visit: Payer: Self-pay | Admitting: Cardiology

## 2024-03-11 MED ORDER — EZETIMIBE 10 MG PO TABS: 10.0000 mg | ORAL_TABLET | Freq: Every day | ORAL | 3 refills | Status: AC

## 2024-03-22 ENCOUNTER — Other Ambulatory Visit: Payer: Self-pay | Admitting: Cardiology

## 2024-04-08 ENCOUNTER — Encounter: Payer: Self-pay | Admitting: Adult Health

## 2024-04-08 ENCOUNTER — Other Ambulatory Visit: Payer: Self-pay

## 2024-04-08 ENCOUNTER — Ambulatory Visit: Payer: Self-pay | Admitting: Adult Health

## 2024-04-08 VITALS — BP 120/72 | HR 85 | Temp 97.9°F | Ht 68.0 in | Wt 228.0 lb

## 2024-04-08 DIAGNOSIS — J0110 Acute frontal sinusitis, unspecified: Secondary | ICD-10-CM

## 2024-04-08 MED ORDER — AMOXICILLIN-POT CLAVULANATE 875-125 MG PO TABS: 1.0000 | ORAL_TABLET | Freq: Two times a day (BID) | ORAL | 0 refills | Status: DC

## 2024-04-08 NOTE — Progress Notes (Signed)
 Therapist, [facility] [address] [place], [place] [address]   Office Visit Note  Patient Name: Jose Edwards Date of Birth [id number]  Medical Record number [id number]  Date of Service: 04/08/2024  Chief Complaint  Patient presents with   Acute Visit    Patient c/o postnasal drainage and nasal congestion. Symptoms have been persistent for about 2 weeks. He began having pressure/pain above his eyes about one week ago. He used Flonase  but did not feel it was very helpful. He took Tylenol  to ease the sinus pressure.     HPI Pt is here for a sick visit. He reports at least 2 weeks of PND, congestion, sore throat, cough, sinus pressure, eye pressure.    Current Medication:  Outpatient Encounter Medications as of 04/08/2024  Medication Sig Note   amoxicillin -clavulanate (AUGMENTIN ) 875-125 MG tablet Take 1 tablet by mouth 2 (two) times daily.    aspirin  EC 81 MG tablet Take 1 tablet (81 mg total) by mouth daily. Swallow whole.    atorvastatin  (LIPITOR ) 80 MG tablet TAKE ONE TABLET EVERY DAY    ezetimibe  (ZETIA ) 10 MG tablet Take 1 tablet (10 mg total) by mouth daily.    FARXIGA  10 MG TABS tablet TAKE 1 TABLET BY MOUTH DAILY BEFORE BREAKFAST    fluticasone  (FLONASE ) 50 MCG/ACT nasal spray Place 2 sprays into both nostrils daily. (Patient taking differently: Place 2 sprays into both nostrils daily as needed.)    losartan  (COZAAR ) 50 MG tablet Take 1 tablet (50 mg total) by mouth daily.    metoprolol  succinate (TOPROL -XL) 50 MG 24 hr tablet TAKE 1 TABLET BY MOUTH DAILY WITH OR IMMEDIATELY FOLLOWING A MEAL    nitroGLYCERIN  (NITROSTAT ) 0.4 MG SL tablet Place 1 tablet (0.4 mg total) under the tongue every 5 (five) minutes as needed for chest pain.    vitamin B-12 (CYANOCOBALAMIN) 100 MCG tablet Take 100 mcg by mouth every other day.  06/22/2023: Never used it before but have it.    No facility-administered encounter medications on file as of 04/08/2024.      Medical History: Past  Medical History:  Diagnosis Date   Acute ST elevation myocardial infarction (STEMI) of anterolateral wall (HCC) 11/26/2020   100% D1 - DES PCI 2.25 x 16 (2.4 mm)   Benign positional vertigo    CAD S/P DES PCI D1  11/27/2020   100% D1 (DES PCI with Onyx Frontier DES 2.25 x 18 - deployed to 2.4 mm)   Coronary artery disease involving native coronary artery of native heart with angina pectoris 11/26/2020   8/29 (Ant-lat STEMI): Cath => Culprit lesion-100% D1 (DES PCI with Onyx Frontier DES 2.25 x 18 - deployed to 2.4 mm). Mid LM 30%, Ost-prox LAD 30% & RI 30%.     ED (erectile dysfunction)    GERD (gastroesophageal reflux disease)    Osteoarthrosis, unspecified whether generalized or localized, unspecified site      Vital Signs: BP 120/72   Pulse 85   Temp 97.9 F (36.6 C)   Ht 5' 8 (1.727 m)   Wt 228 lb (103.4 kg)   SpO2 96%   BMI 34.67 kg/m    Review of Systems  Constitutional:  Negative for chills, fatigue and fever.  HENT:  Positive for congestion, postnasal drip, sinus pressure, sinus pain and sore throat.   Eyes:  Negative for pain and itching.  Respiratory:  Positive for cough.   Cardiovascular:  Negative for chest pain.  Gastrointestinal:  Negative for  diarrhea, nausea and vomiting.    Physical Exam Vitals reviewed.  Constitutional:      Appearance: Normal appearance.  HENT:     Head: Normocephalic.     Nose:     Right Sinus: Frontal sinus tenderness present. No maxillary sinus tenderness.     Left Sinus: Frontal sinus tenderness present. No maxillary sinus tenderness.     Mouth/Throat:     Mouth: Mucous membranes are moist.  Eyes:     Pupils: Pupils are equal, round, and reactive to light.  Pulmonary:     Effort: Pulmonary effort is normal.  Lymphadenopathy:     Cervical: No cervical adenopathy.  Neurological:     Mental Status: He is alert.    Assessment/Plan: 1. Acute non-recurrent frontal sinusitis (Primary) Patient Instructions: -Take complete  course of antibiotics as prescribed.  Take with food.  -Try Flonase /Fluticasone  nasal spray, 2 sprays to each nostril once a day. -You can try using a neti pot or nasal saline rinse product to help clear mucus congestion. -Rest and stay well hydrated (by drinking water and other liquids). Avoid/limit caffeine. -Take over-the-counter medicines (i.e. Mucinex, decongestant, Ibuprofen or Tylenol , cough suppressant) to help relieve your symptoms. -For your cough, use cough drops/throat lozenges, gargle warm salt water and/or drink warm liquids (like tea with honey). -Send my chart message to provider or schedule return visit as needed for new/worsening symptoms or if symptoms do not improve as discussed with antibiotic and other recommended treatment.   - amoxicillin -clavulanate (AUGMENTIN ) 875-125 MG tablet; Take 1 tablet by mouth 2 (two) times daily.  Dispense: 20 tablet; Refill: 0     General Counseling: Jose Edwards verbalizes understanding of the findings of todays visit and agrees with plan of treatment. I have discussed any further diagnostic evaluation that may be needed or ordered today. We also reviewed his medications today. he has been encouraged to call the office with any questions or concerns that should arise related to todays visit.   No orders of the defined types were placed in this encounter.   Meds ordered this encounter  Medications   amoxicillin -clavulanate (AUGMENTIN ) 875-125 MG tablet    Sig: Take 1 tablet by mouth 2 (two) times daily.    Dispense:  20 tablet    Refill:  0    Time spent:15 Minutes    Juliene DOROTHA Howells AGNP-C Nurse Practitioner

## 2024-04-26 ENCOUNTER — Other Ambulatory Visit: Payer: Self-pay

## 2024-04-26 ENCOUNTER — Ambulatory Visit: Payer: Self-pay | Admitting: Medical

## 2024-04-26 ENCOUNTER — Encounter: Payer: Self-pay | Admitting: Medical

## 2024-04-26 VITALS — BP 122/70 | HR 82 | Temp 98.2°F | Ht 68.0 in

## 2024-04-26 DIAGNOSIS — J029 Acute pharyngitis, unspecified: Secondary | ICD-10-CM

## 2024-04-26 DIAGNOSIS — J069 Acute upper respiratory infection, unspecified: Secondary | ICD-10-CM

## 2024-04-26 LAB — POC COVID19/FLU A&B COMBO
Covid Antigen, POC: NEGATIVE
Influenza A Antigen, POC: NEGATIVE
Influenza B Antigen, POC: NEGATIVE

## 2024-04-26 LAB — POCT RAPID STREP A (OFFICE): Rapid Strep A Screen: NEGATIVE

## 2024-04-26 NOTE — Patient Instructions (Signed)
-  Rest and stay well hydrated (by drinking water and other liquids).  -Take over-the-counter medicines (i.e. Coricidin Max Strength Cold and Flu, Nyquil) to help relieve your symptoms. -Use Flonase /Fluticasone  nasal spray, 2 sprays to each nostril once a day. -Use nasal saline rinse/neti pot or inhale steam to help relieve congestion and clear mucus. -For your sore throat/cough, use cough drops/throat lozenges, gargle warm salt water and/or drink warm liquids (like tea with honey). -Send MyChart message to provider, call or schedule return visit as needed for new/worsening symptoms (i.e. increasing throat pain, fever, severe or persistent sinus pain/pressure) or if symptoms do not improve as discussed with recommended treatment over the next 5-7 days.

## 2024-04-26 NOTE — Progress Notes (Signed)
 [facility] [address] [place], [place] [address]   Office Visit Note  Patient Name: Jose Edwards Date of Birth [id number]  Medical Record number [id number]  Date of Service: 04/26/2024  Chief Complaint  Patient presents with   Acute Visit    Patient states his symptoms improved from his last visit after taking Augmentin . Patient reports runny nose, PND, and sore throat x 3 days. My nose is running like a faucet and my ears keep popping. Denies fever. He has took Tylenol  over the weekend.      HPI Discussed the use of AI scribe software for clinical note transcription with the patient, who gave verbal consent to proceed.  History of Present Illness Jose Edwards is a 69 year old male who presents with a sore throat and respiratory symptoms.  Pharyngitis and odynophagia - Sore throat onset 4 nights ago, worsened over last few days - Recent antibiotic course (Augmentin  BID x 10 days) for similar symptoms with reported symptom resolution, but recurrence of symptoms within a few days of completing antibiotic - Current sore throat worse than with previous illness - Throat pain has reduced oral intake; eating less - Intermittent use of Tylenol  for symptom relief  Other respiratory symptoms - Nasal congestion with clear discharge - Cough productive of yellow and white sputum, occasionally streaked with blood - No documented fever - Intermittent chills - No sweats, muscle aches, headache, nausea, or diarrhea  Constitutional symptoms - Fatigue, worse on 3 days ago, improved enough for some activities yesterday, but still felt more fatigued as day went on  Cardiovascular history - Myocardial infarction in 2023 - Aortic aneurysm - Hx of HTN controlled with Losartan  and Metoprolol   Infectious exposure and preventive care - Wife currently has a sinus infection - No influenza vaccination this season  -Pseudoephedrine caused significant  drowsiness in past and should likely be avoided anyway given cardiac hx   Current Medication:  Outpatient Encounter Medications as of 04/26/2024  Medication Sig Note   aspirin  EC 81 MG tablet Take 1 tablet (81 mg total) by mouth daily. Swallow whole.    atorvastatin  (LIPITOR ) 80 MG tablet TAKE ONE TABLET EVERY DAY    ezetimibe  (ZETIA ) 10 MG tablet Take 1 tablet (10 mg total) by mouth daily.    FARXIGA  10 MG TABS tablet TAKE 1 TABLET BY MOUTH DAILY BEFORE BREAKFAST    fluticasone  (FLONASE ) 50 MCG/ACT nasal spray Place 2 sprays into both nostrils daily. (Patient taking differently: Place 2 sprays into both nostrils daily as needed.)    losartan  (COZAAR ) 50 MG tablet Take 1 tablet (50 mg total) by mouth daily.    metoprolol  succinate (TOPROL -XL) 50 MG 24 hr tablet TAKE 1 TABLET BY MOUTH DAILY WITH OR IMMEDIATELY FOLLOWING A MEAL    nitroGLYCERIN  (NITROSTAT ) 0.4 MG SL tablet Place 1 tablet (0.4 mg total) under the tongue every 5 (five) minutes as needed for chest pain.    vitamin B-12 (CYANOCOBALAMIN) 100 MCG tablet Take 100 mcg by mouth every other day.  06/22/2023: Never used it before but have it.    [DISCONTINUED] amoxicillin -clavulanate (AUGMENTIN ) 875-125 MG tablet Take 1 tablet by mouth 2 (two) times daily.    No facility-administered encounter medications on file as of 04/26/2024.      Medical History: Past Medical History:  Diagnosis Date   Acute ST elevation myocardial infarction (STEMI) of anterolateral wall (HCC) 11/26/2020   100% D1 - DES PCI 2.25 x 16 (2.4 mm)  Benign positional vertigo    CAD S/P DES PCI D1  11/27/2020   100% D1 (DES PCI with Onyx Frontier DES 2.25 x 18 - deployed to 2.4 mm)   Coronary artery disease involving native coronary artery of native heart with angina pectoris 11/26/2020   8/29 (Ant-lat STEMI): Cath => Culprit lesion-100% D1 (DES PCI with Onyx Frontier DES 2.25 x 18 - deployed to 2.4 mm). Mid LM 30%, Ost-prox LAD 30% & RI 30%.     ED (erectile  dysfunction)    GERD (gastroesophageal reflux disease)    Osteoarthrosis, unspecified whether generalized or localized, unspecified site      Vital Signs: BP 122/70   Pulse 82   Temp 98.2 F (36.8 C)   Ht 5' 8 (1.727 m)   SpO2 98%   BMI 34.67 kg/m    Review of Systems  Constitutional:  Positive for chills and fatigue. Negative for fever.  HENT:  Positive for congestion, postnasal drip, rhinorrhea, sore throat and trouble swallowing.   Respiratory:  Positive for cough. Negative for shortness of breath.   Cardiovascular:  Negative for chest pain.  Gastrointestinal:  Negative for diarrhea, nausea and vomiting.  Musculoskeletal:  Negative for myalgias and neck pain.  Neurological:  Negative for headaches.    Physical Exam Vitals reviewed.  Constitutional:      General: He is not in acute distress.    Comments: Tired, slightly unwell appearing  HENT:     Head: Normocephalic.     Right Ear: Ear canal and external ear normal.     Left Ear: Ear canal and external ear normal.     Ears:     Comments: TMs dull bilaterally    Nose: Mucosal edema and congestion present. No rhinorrhea.     Right Sinus: No maxillary sinus tenderness or frontal sinus tenderness.     Left Sinus: No maxillary sinus tenderness or frontal sinus tenderness.     Mouth/Throat:     Mouth: Mucous membranes are moist. No oral lesions.     Pharynx: Posterior oropharyngeal erythema (mild) present. No pharyngeal swelling.     Tonsils: No tonsillar exudate. 0 on the right. 0 on the left.  Cardiovascular:     Rate and Rhythm: Normal rate and regular rhythm.     Heart sounds: No murmur heard.    No friction rub. No gallop.  Pulmonary:     Effort: Pulmonary effort is normal.     Breath sounds: Normal breath sounds. No wheezing, rhonchi or rales.  Musculoskeletal:     Cervical back: Neck supple. No rigidity.  Lymphadenopathy:     Cervical: Cervical adenopathy (1+ anterior nodes, mildly tender) present.   Neurological:     Mental Status: He is alert.    Results for orders placed or performed in visit on 04/26/24 (from the past 24 hours)  POC Covid19/Flu A&B Antigen     Status: Normal   Collection Time: 04/26/24  1:51 PM  Result Value Ref Range   Influenza A Antigen, POC Negative Negative   Influenza B Antigen, POC Negative Negative   Covid Antigen, POC Negative Negative  POCT rapid strep A     Status: Normal   Collection Time: 04/26/24  1:52 PM  Result Value Ref Range   Rapid Strep A Screen Negative Negative      Assessment/Plan: 1. Acute upper respiratory infection (Primary) 2. Pharyngitis, unspecified etiology  - POCT rapid strep A - POC Covid19/Flu A&B Antigen Assessment & Plan Acute Upper  Respiratory Infection with Pharyngitis Recurrent sore throat and nasal congestion which developed several days after completing course of antibiotic. Symptoms and exam suggest new viral URI. POC strep, flu, and COVID-19 antigen tests negative. - Use Flonase  nasal spray daily for nasal congestion. - Recommended Coricidin Maximum Strength Cold and Flu for daytime use, discussed it will be best to avoid decongestants given high blood pressure and other cardiac conditions. - Consider NyQuil at bedtime. - Rest and hydrate with warm liquids. - Use throat lozenges/cough drops for sore throat. - Monitor symptoms and return if worsens or not improving as discussed over next 5-7 days. - Offered work excuse, patient declined but will call if he feels he needs this.    General Counseling: Dayven verbalizes understanding of the findings of todays visit and plan of treatment. he has been encouraged to call the office with any questions or concerns that should arise related to todays visit.    Time spent:20 Minutes    Joen Arts PA-C Physician Assistant
